# Patient Record
Sex: Female | Born: 1940
Health system: Southern US, Community
[De-identification: ages and names within clinical notes are randomized; demographics above are authoritative.]

## PROBLEM LIST (undated history)

## (undated) DIAGNOSIS — M545 Low back pain, unspecified: Secondary | ICD-10-CM

## (undated) DIAGNOSIS — G629 Polyneuropathy, unspecified: Secondary | ICD-10-CM

## (undated) DIAGNOSIS — I10 Essential (primary) hypertension: Secondary | ICD-10-CM

## (undated) DIAGNOSIS — E119 Type 2 diabetes mellitus without complications: Secondary | ICD-10-CM

## (undated) DIAGNOSIS — K219 Gastro-esophageal reflux disease without esophagitis: Secondary | ICD-10-CM

## (undated) DIAGNOSIS — E782 Mixed hyperlipidemia: Secondary | ICD-10-CM

## (undated) DIAGNOSIS — J309 Allergic rhinitis, unspecified: Secondary | ICD-10-CM

## (undated) DIAGNOSIS — J189 Pneumonia, unspecified organism: Secondary | ICD-10-CM

## (undated) DIAGNOSIS — I272 Pulmonary hypertension, unspecified: Secondary | ICD-10-CM

## (undated) DIAGNOSIS — G4733 Obstructive sleep apnea (adult) (pediatric): Secondary | ICD-10-CM

## (undated) HISTORY — DX: Mixed hyperlipidemia: E78.2

## (undated) HISTORY — PX: COLONOSCOPY: SHX174

## (undated) HISTORY — DX: Pulmonary hypertension, unspecified: I27.20

## (undated) HISTORY — DX: Low back pain, unspecified: M54.50

## (undated) HISTORY — DX: Type 2 diabetes mellitus without complications: E11.9

## (undated) HISTORY — PX: CYSTECTOMY: SUR359

## (undated) HISTORY — DX: Essential (primary) hypertension: I10

## (undated) HISTORY — DX: Allergic rhinitis, unspecified: J30.9

## (undated) HISTORY — PX: EYE SURGERY: SHX253

## (undated) HISTORY — PX: REPLACEMENT TOTAL KNEE BILATERAL: SUR1225

## (undated) HISTORY — DX: Low back pain: M54.5

## (undated) HISTORY — DX: Gastro-esophageal reflux disease without esophagitis: K21.9

## (undated) HISTORY — DX: Obstructive sleep apnea (adult) (pediatric): G47.33

## (undated) HISTORY — PX: ABDOMINAL HYSTERECTOMY: SHX81

## (undated) HISTORY — PX: CHOLECYSTECTOMY: SHX55

## (undated) HISTORY — DX: Polyneuropathy, unspecified: G62.9

---

## 1998-07-03 ENCOUNTER — Other Ambulatory Visit: Admission: RE | Admit: 1998-07-03 | Discharge: 1998-07-03 | Payer: Self-pay | Admitting: *Deleted

## 1998-07-27 ENCOUNTER — Encounter: Admission: RE | Admit: 1998-07-27 | Discharge: 1998-08-15 | Payer: Self-pay | Admitting: Specialist

## 1999-01-10 ENCOUNTER — Encounter: Payer: Self-pay | Admitting: Specialist

## 1999-01-18 ENCOUNTER — Inpatient Hospital Stay (HOSPITAL_COMMUNITY): Admission: RE | Admit: 1999-01-18 | Discharge: 1999-01-24 | Payer: Self-pay | Admitting: Specialist

## 1999-03-28 ENCOUNTER — Encounter: Admission: RE | Admit: 1999-03-28 | Discharge: 1999-05-03 | Payer: Self-pay | Admitting: Specialist

## 2003-11-03 ENCOUNTER — Encounter: Admission: RE | Admit: 2003-11-03 | Discharge: 2004-01-17 | Payer: Self-pay | Admitting: Internal Medicine

## 2004-10-18 ENCOUNTER — Observation Stay (HOSPITAL_COMMUNITY): Admission: EM | Admit: 2004-10-18 | Discharge: 2004-10-19 | Payer: Self-pay | Admitting: Emergency Medicine

## 2009-12-13 ENCOUNTER — Encounter: Payer: Self-pay | Admitting: Cardiovascular Disease

## 2013-03-08 DIAGNOSIS — H269 Unspecified cataract: Secondary | ICD-10-CM | POA: Diagnosis not present

## 2013-03-08 DIAGNOSIS — H2510 Age-related nuclear cataract, unspecified eye: Secondary | ICD-10-CM | POA: Diagnosis not present

## 2013-03-09 DIAGNOSIS — H2510 Age-related nuclear cataract, unspecified eye: Secondary | ICD-10-CM | POA: Diagnosis not present

## 2013-03-22 DIAGNOSIS — H269 Unspecified cataract: Secondary | ICD-10-CM | POA: Diagnosis not present

## 2013-03-22 DIAGNOSIS — H2510 Age-related nuclear cataract, unspecified eye: Secondary | ICD-10-CM | POA: Diagnosis not present

## 2013-03-23 DIAGNOSIS — R011 Cardiac murmur, unspecified: Secondary | ICD-10-CM | POA: Diagnosis not present

## 2013-03-23 DIAGNOSIS — E1149 Type 2 diabetes mellitus with other diabetic neurological complication: Secondary | ICD-10-CM | POA: Diagnosis not present

## 2013-03-23 DIAGNOSIS — R809 Proteinuria, unspecified: Secondary | ICD-10-CM | POA: Diagnosis not present

## 2013-04-01 DIAGNOSIS — D510 Vitamin B12 deficiency anemia due to intrinsic factor deficiency: Secondary | ICD-10-CM | POA: Diagnosis not present

## 2013-04-01 DIAGNOSIS — D539 Nutritional anemia, unspecified: Secondary | ICD-10-CM | POA: Diagnosis not present

## 2013-04-14 ENCOUNTER — Other Ambulatory Visit (HOSPITAL_COMMUNITY): Payer: Self-pay

## 2013-04-15 ENCOUNTER — Other Ambulatory Visit (HOSPITAL_COMMUNITY): Payer: Self-pay

## 2013-05-03 ENCOUNTER — Other Ambulatory Visit (HOSPITAL_COMMUNITY): Payer: Self-pay | Admitting: Family Medicine

## 2013-05-03 ENCOUNTER — Ambulatory Visit (HOSPITAL_COMMUNITY): Payer: Medicare Other | Attending: Family Medicine | Admitting: Cardiology

## 2013-05-03 DIAGNOSIS — R011 Cardiac murmur, unspecified: Secondary | ICD-10-CM | POA: Diagnosis not present

## 2013-05-03 DIAGNOSIS — I079 Rheumatic tricuspid valve disease, unspecified: Secondary | ICD-10-CM | POA: Insufficient documentation

## 2013-05-03 DIAGNOSIS — E785 Hyperlipidemia, unspecified: Secondary | ICD-10-CM | POA: Diagnosis not present

## 2013-05-03 DIAGNOSIS — E119 Type 2 diabetes mellitus without complications: Secondary | ICD-10-CM | POA: Insufficient documentation

## 2013-05-03 DIAGNOSIS — I080 Rheumatic disorders of both mitral and aortic valves: Secondary | ICD-10-CM | POA: Diagnosis not present

## 2013-05-03 DIAGNOSIS — I10 Essential (primary) hypertension: Secondary | ICD-10-CM | POA: Insufficient documentation

## 2013-05-03 DIAGNOSIS — I379 Nonrheumatic pulmonary valve disorder, unspecified: Secondary | ICD-10-CM | POA: Insufficient documentation

## 2013-05-03 NOTE — Progress Notes (Signed)
Echo performed. 

## 2013-05-24 DIAGNOSIS — D510 Vitamin B12 deficiency anemia due to intrinsic factor deficiency: Secondary | ICD-10-CM | POA: Diagnosis not present

## 2013-06-03 ENCOUNTER — Encounter: Payer: Self-pay | Admitting: *Deleted

## 2013-06-04 ENCOUNTER — Encounter: Payer: Self-pay | Admitting: Cardiology

## 2013-06-07 ENCOUNTER — Ambulatory Visit (INDEPENDENT_AMBULATORY_CARE_PROVIDER_SITE_OTHER): Payer: Medicare Other | Admitting: Cardiovascular Disease

## 2013-06-07 ENCOUNTER — Encounter: Payer: Self-pay | Admitting: Cardiovascular Disease

## 2013-06-07 ENCOUNTER — Encounter: Payer: Self-pay | Admitting: *Deleted

## 2013-06-07 VITALS — BP 130/84 | HR 76 | Ht 64.0 in | Wt 260.0 lb

## 2013-06-07 DIAGNOSIS — I519 Heart disease, unspecified: Secondary | ICD-10-CM | POA: Insufficient documentation

## 2013-06-07 DIAGNOSIS — R0602 Shortness of breath: Secondary | ICD-10-CM | POA: Diagnosis not present

## 2013-06-07 DIAGNOSIS — R079 Chest pain, unspecified: Secondary | ICD-10-CM

## 2013-06-07 DIAGNOSIS — Q2111 Secundum atrial septal defect: Secondary | ICD-10-CM

## 2013-06-07 DIAGNOSIS — Z136 Encounter for screening for cardiovascular disorders: Secondary | ICD-10-CM

## 2013-06-07 DIAGNOSIS — I38 Endocarditis, valve unspecified: Secondary | ICD-10-CM

## 2013-06-07 DIAGNOSIS — Q211 Atrial septal defect, unspecified: Secondary | ICD-10-CM

## 2013-06-07 NOTE — Patient Instructions (Signed)
Your physician has requested that you have a lexiscan myoview. For further information please visit [email or web address]. Please follow instruction sheet, as given. Office will contact with results via phone or letter.   Continue all current medications. Encouraged patient to wear c-pap Follow up in  3 months

## 2013-06-07 NOTE — Progress Notes (Signed)
Patient ID: Melissa Zhang, female   DOB: December 16, 1940, 73 y.o.   MRN: [id number]       CARDIOLOGY CONSULT NOTE  Patient ID: Melissa Zhang MRN: [id number] DOB/AGE: 1940-09-19 73 y.o.  Admit date: (Not on file) Primary Physician FRIED, Jaymes Graff, MD  Reason for Consultation: sob  HPI: The patient is a 73 year old woman with a history of insulin-dependent diabetes mellitus, hypertension, hyperlipidemia, and hypothyroidism who is referred today for shortness of breath. An ECG done today in the office shows sinus rhythm with a left anterior fascicular block. She went to her PA who heard a murmur, for which an echocardiogram was ordered. It showed normal left ventricular systolic function, EF 54-27%, mild LVH, grade 2 diastolic dysfunction, elevated filling pressures, mild aortic regurgitation, calcified mitral leaflet tips with moderate central regurgitation, moderate tricuspid regurgitation, PASP 61 mm, and a small secundum ASD. Upon further discussion, it appears that she was diagnosed with sleep apnea several years ago but has refused to use CPAP. She has been short of breath with exertion over the past 3-4 months. It has not progressed in frequency or severity. She has occasionally had intermittent chest pains over the left precordium as well. She says she has chronic leg swelling. She believes her blood pressure is normally well controlled. She denies lightheadedness, dizziness and syncope.   Allergies  Allergen Reactions  . Actos [Pioglitazone]   . Ciprofloxacin   . Lisinopril     Current Outpatient Prescriptions  Medication Sig Dispense Refill  . aspirin 81 MG tablet Take 81 mg by mouth daily.      . bisoprolol-hydrochlorothiazide (ZIAC) 5-6.25 MG per tablet Take 1 tablet by mouth daily.      . DULoxetine (CYMBALTA) 60 MG capsule Take 60 mg by mouth daily.      Marland Kitchen gabapentin (NEURONTIN) 300 MG capsule Take 300 mg by mouth at bedtime.      Marland Kitchen glimepiride (AMARYL) 4 MG  tablet Take 4 mg by mouth 2 (two) times daily.      Marland Kitchen LANTUS SOLOSTAR 100 UNIT/ML Solostar Pen Inject 25 Units into the skin daily.      Marland Kitchen levothyroxine (SYNTHROID, LEVOTHROID) 50 MCG tablet Take 50 mcg by mouth daily.      . Liraglutide 18 MG/3ML SOPN Inject 1.8 mg into the skin daily.      Marland Kitchen losartan (COZAAR) 100 MG tablet Take 100 mg by mouth daily.       Marland Kitchen lovastatin (MEVACOR) 40 MG tablet Take 40 mg by mouth at bedtime.      . metFORMIN (GLUCOPHAGE) 1000 MG tablet Take 1,000 mg by mouth 2 (two) times daily.       No current facility-administered medications for this visit.    Past Medical History  Diagnosis Date  . Type 2 diabetes mellitus   . Hypertension   . Low back pain   . Peripheral neuropathy   . Allergic rhinitis   . Esophageal reflux   . Obstructive sleep apnea   . Mixed hyperlipidemia     Past Surgical History  Procedure Laterality Date  . Cholecystectomy    . Abdominal hysterectomy    . Replacement total knee bilateral    . Cystectomy      LEFT BREAST    History   Social History  . Marital Status: Married    Spouse Name: N/A    Number of Children: N/A  . Years of Education: N/A   Occupational History  . Not on file.  Social History Main Topics  . Smoking status: Never Smoker   . Smokeless tobacco: Not on file  . Alcohol Use: No  . Drug Use: No  . Sexual Activity: Not on file   Other Topics Concern  . Not on file   Social History Narrative  . No narrative on file     No family history of premature CAD in 1st degree relatives.  Prior to Admission medications   Medication Sig Start Date End Date Taking? Authorizing Provider  aspirin 81 MG tablet Take 81 mg by mouth daily.   Yes Historical Provider, MD  bisoprolol-hydrochlorothiazide (ZIAC) 5-6.25 MG per tablet Take 1 tablet by mouth daily.   Yes Historical Provider, MD  DULoxetine (CYMBALTA) 60 MG capsule Take 60 mg by mouth daily. 03/05/13  Yes Historical Provider, MD  gabapentin  (NEURONTIN) 300 MG capsule Take 300 mg by mouth at bedtime. 04/11/13  Yes Historical Provider, MD  glimepiride (AMARYL) 4 MG tablet Take 4 mg by mouth 2 (two) times daily. 02/26/13  Yes Historical Provider, MD  LANTUS SOLOSTAR 100 UNIT/ML Solostar Pen Inject 25 Units into the skin daily. 03/01/13  Yes Historical Provider, MD  levothyroxine (SYNTHROID, LEVOTHROID) 50 MCG tablet Take 50 mcg by mouth daily. 02/26/13  Yes Historical Provider, MD  Liraglutide 18 MG/3ML SOPN Inject 1.8 mg into the skin daily.   Yes Historical Provider, MD  losartan (COZAAR) 100 MG tablet Take 100 mg by mouth daily.  03/26/13  Yes Historical Provider, MD  lovastatin (MEVACOR) 40 MG tablet Take 40 mg by mouth at bedtime. 03/26/13  Yes Historical Provider, MD  metFORMIN (GLUCOPHAGE) 1000 MG tablet Take 1,000 mg by mouth 2 (two) times daily. 02/26/13  Yes Historical Provider, MD     Review of systems complete and found to be negative unless listed above in HPI     Physical exam Blood pressure 130/84, pulse 76, height 5\' 4"  (1.626 m), weight 260 lb (117.935 kg), SpO2 98.00%. General: NAD Neck: No JVD, no thyromegaly or thyroid nodule.  Lungs: Clear to auscultation bilaterally with normal respiratory effort. CV: Nondisplaced PMI.  Heart regular S1/S2, no S3/S4, II/VI apical holosystolic murmur and II/VI pansystolic murmur at base.  Trace non-pitting peripheral edema.  No carotid bruit.  Normal pedal pulses.  Abdomen: Soft, nontender, no hepatosplenomegaly, no distention.  Skin: Intact without lesions or rashes.  Neurologic: Alert and oriented x 3.  Psych: Normal affect. Extremities: No clubbing or cyanosis.  HEENT: Normal.   Labs:   No results found for this basename: WBC, HGB, HCT, MCV, PLT   No results found for this basename: NA, K, CL, CO2, BUN, CREATININE, CALCIUM, LABALBU, PROT, BILITOT, ALKPHOS, ALT, AST, GLUCOSE,  in the last 168 hours No results found for this basename: CKTOTAL, CKMB, CKMBINDEX, TROPONINI    No  results found for this basename: CHOL   No results found for this basename: HDL   No results found for this basename: LDLCALC   No results found for this basename: TRIG   No results found for this basename: CHOLHDL   No results found for this basename: LDLDIRECT         Studies:  - Left ventricle: The cavity size was normal. There was mild concentric hypertrophy. Systolic function was normal. The estimated ejection fraction was in the range of 60% to 65%. Wall motion was normal; there were no regional wall motion abnormalities. Doppler parameters are consistent with pseduonormal left ventricular relaxation (grade2 diastolic dysfunction). The E/A ratio is  1.5. The E/e' ratio is >15, suggesting markedly elevated LV filling pressure. - Aortic valve: Mildly calcified leaflets. Mild regurgitation. - Mitral valve: Calcified leaflet tips, with a larger calcified mass on the posterior mitral leaflet tip. Moderate central regurgitation. - Left atrium: The atrium was moderately dilated (36 ml/m2). - Atrial septum: Suggestion of a small secundum ASD by color doppler with left to right flow. - Tricuspid valve: Moderate regurgitation. - Pulmonic valve: The valve appears to be grossly normal. Mild regurgitation. - Pulmonary arteries: PA peak pressure: 78mm Hg (S). - Inferior vena cava: The vessel was normal in size; the respirophasic diameter changes were in the normal range (= 50%); findings are consistent with normal central venous pressure. - Pericardium, extracardiac: There was no pericardial effusion.   ASSESSMENT AND PLAN: 1. Dyspnea on exertion: It appears leaflet calcification of the mitral valve has led to moderate mitral regurgitation, which is contributing to her shortness of breath. She also has moderate tricuspid regurgitation with elevated pulmonary pressures, which is being exacerbated most certainly by untreated sleep apnea. I have strongly recommended that she  consider the use of CPAP. Because she has also had chest pains and given her history of diabetes, hypertension, and hyperlipidemia, I will also proceed with a Lexi scan Cardiolite stress test to evaluate for occult ischemic heart disease.  2. Chest pain: see #1. Do to multiple cardiovascular risk factors, will proceed with a nuclear stress test to evaluate for occult ischemic heart disease.  3. Grade II diastolic dysfunction: In order to avoid diastolic decompensation and heart failure, will aim to optimally control blood pressure. BP appears to be well controlled today.  4. Small secundum ASD: This appears to be small and is not likely to be causing significant hemodynamic problems. If her dyspnea on exertion were to increase in frequency or severity, I would consider right heart catheterization and shunt quantification.  5. Valvular heart disease: see #1.  Dispo: f/u 3 months.  Signed: Kate Sable, M.D., F.A.C.C.  06/07/2013, 1:38 PM

## 2013-06-15 ENCOUNTER — Encounter (HOSPITAL_COMMUNITY)
Admission: RE | Admit: 2013-06-15 | Discharge: 2013-06-15 | Disposition: A | Discharge status: Home / Self Care | Payer: Medicare Other | Source: Ambulatory Visit | Attending: Cardiovascular Disease | Admitting: Cardiovascular Disease

## 2013-06-15 ENCOUNTER — Encounter (HOSPITAL_COMMUNITY): Payer: Self-pay

## 2013-06-15 DIAGNOSIS — I10 Essential (primary) hypertension: Secondary | ICD-10-CM | POA: Insufficient documentation

## 2013-06-15 DIAGNOSIS — R0602 Shortness of breath: Secondary | ICD-10-CM | POA: Insufficient documentation

## 2013-06-15 DIAGNOSIS — Z79899 Other long term (current) drug therapy: Secondary | ICD-10-CM | POA: Insufficient documentation

## 2013-06-15 DIAGNOSIS — E785 Hyperlipidemia, unspecified: Secondary | ICD-10-CM | POA: Insufficient documentation

## 2013-06-15 DIAGNOSIS — R079 Chest pain, unspecified: Secondary | ICD-10-CM | POA: Insufficient documentation

## 2013-06-15 DIAGNOSIS — Z794 Long term (current) use of insulin: Secondary | ICD-10-CM | POA: Insufficient documentation

## 2013-06-15 DIAGNOSIS — E119 Type 2 diabetes mellitus without complications: Secondary | ICD-10-CM | POA: Insufficient documentation

## 2013-06-15 DIAGNOSIS — I38 Endocarditis, valve unspecified: Secondary | ICD-10-CM

## 2013-06-15 MED ORDER — SODIUM CHLORIDE 0.9 % IJ SOLN
INTRAMUSCULAR | Status: AC
  Administered 2013-06-15: 10 mL via INTRAVENOUS
  Filled 2013-06-15: qty 10

## 2013-06-15 MED ORDER — TECHNETIUM TC 99M SESTAMIBI - CARDIOLITE
30.0000 | Freq: Once | INTRAVENOUS | Status: AC | PRN
  Administered 2013-06-15: 11:00:00 30 via INTRAVENOUS

## 2013-06-15 MED ORDER — TECHNETIUM TC 99M SESTAMIBI GENERIC - CARDIOLITE
10.0000 | Freq: Once | INTRAVENOUS | Status: AC | PRN
  Administered 2013-06-15: 10 via INTRAVENOUS

## 2013-06-15 MED ORDER — REGADENOSON 0.4 MG/5ML IV SOLN
INTRAVENOUS | Status: AC
Start: 2013-06-15 — End: 2013-06-15
  Administered 2013-06-15: 0.4 mg via INTRAVENOUS
  Filled 2013-06-15: qty 5

## 2013-06-15 NOTE — Progress Notes (Signed)
Stress Lab Nurses Notes - [facility]  HASEL JANISH 06/15/2013 Reason for doing test: Chest Pain and Dyspnea Type of test: Wille Glaser Nurse performing test: Gerrit Halls, RN Nuclear Medicine Tech: Melburn Hake Echo Tech: Not Applicable MD performing test: Koneswaran/K.Purcell Nails NP Family MD: Dr. Maceo Pro Test explained and consent signed: yes IV started: 22g jelco, Saline lock flushed, No redness or edema and Saline lock started in radiology Symptoms: stomach discomfort Treatment/Intervention: None Reason test stopped: protocol completed After recovery IV was: Discontinued via X-ray tech and No redness or edema Patient to return to [place]. Med at : 11:25 Patient discharged: Home Patient's Condition upon discharge was: stable Comments: During test BP 147/70 & HR 94.  Recovery BP 129/67 & HR 81.  Symptoms resolved in recovery. Donnajean Lopes

## 2013-06-25 ENCOUNTER — Telehealth: Payer: Self-pay | Admitting: *Deleted

## 2013-06-25 NOTE — Telephone Encounter (Signed)
Message copied by Laurine Blazer on Fri Jun 25, 2013 10:05 AM ------      Message from: Kate Sable A      Created: Fri Jun 18, 2013  9:21 AM       No, that's fine. ------

## 2013-06-25 NOTE — Telephone Encounter (Signed)
Notes Recorded by Laurine Blazer, LPN on 08/23/1948 at 93:26 AM Follow up scheduled for 09/13/2013 with Dr. Bronson Ing. ------ Notes Recorded by Laurine Blazer, LPN on 08/18/2456 at 09:98 AM Patient notified and verbalized understanding.  ----- Notes Recorded by Herminio Commons, MD on 06/18/2013 at 9:21 AM No, that's fine. ------ Notes Recorded by Laurine Blazer, LPN on 3/38/2505 at 3:97 PM She is not due to come back for 3 months.  Do you want to see her sooner? ------ Notes Recorded by Herminio Commons, MD on 06/15/2013 at 4:18 PM I will discuss results with pt at ov.

## 2013-09-13 ENCOUNTER — Ambulatory Visit (INDEPENDENT_AMBULATORY_CARE_PROVIDER_SITE_OTHER): Payer: Medicare Other | Admitting: Cardiovascular Disease

## 2013-09-13 ENCOUNTER — Encounter: Payer: Self-pay | Admitting: Cardiovascular Disease

## 2013-09-13 VITALS — BP 139/83 | HR 69 | Ht 62.0 in | Wt 264.0 lb

## 2013-09-13 DIAGNOSIS — I519 Heart disease, unspecified: Secondary | ICD-10-CM | POA: Diagnosis not present

## 2013-09-13 DIAGNOSIS — R079 Chest pain, unspecified: Secondary | ICD-10-CM | POA: Diagnosis not present

## 2013-09-13 DIAGNOSIS — R0602 Shortness of breath: Secondary | ICD-10-CM

## 2013-09-13 DIAGNOSIS — Q2111 Secundum atrial septal defect: Secondary | ICD-10-CM

## 2013-09-13 DIAGNOSIS — I38 Endocarditis, valve unspecified: Secondary | ICD-10-CM | POA: Diagnosis not present

## 2013-09-13 DIAGNOSIS — R609 Edema, unspecified: Secondary | ICD-10-CM

## 2013-09-13 DIAGNOSIS — Q211 Atrial septal defect, unspecified: Secondary | ICD-10-CM

## 2013-09-13 DIAGNOSIS — R600 Localized edema: Secondary | ICD-10-CM

## 2013-09-13 MED ORDER — HYDROCHLOROTHIAZIDE 12.5 MG PO CAPS: 12.5000 mg | ORAL_CAPSULE | Freq: Every day | ORAL | Status: DC

## 2013-09-13 NOTE — Progress Notes (Signed)
Patient ID: Melissa Zhang, female   DOB: Jan 12, 1941, 73 y.o.   MRN: [id number]      SUBJECTIVE: The patient is here to followup on the results of cardiovascular testing performed for the evaluation of chest pain and dyspnea on exertion. Nuclear stress testing demonstrated a small, fixed anteroapical defect which i=was likely due to soft tissue  attenuation artifact rather than myocardial scar. She very seldom has chest pains. She has chronic leg swelling. She has not used CPAP since her last visit. Her chronic dyspnea on exertion over the past several months has not gotten any worse.    Allergies  Allergen Reactions  . Actos [Pioglitazone]   . Ciprofloxacin   . Lisinopril     Current Outpatient Prescriptions  Medication Sig Dispense Refill  . aspirin 81 MG tablet Take 81 mg by mouth daily.      . bisoprolol-hydrochlorothiazide (ZIAC) 5-6.25 MG per tablet Take 1 tablet by mouth daily.      . DULoxetine (CYMBALTA) 60 MG capsule Take 60 mg by mouth daily.      Marland Kitchen gabapentin (NEURONTIN) 300 MG capsule Take 300 mg by mouth at bedtime.      Marland Kitchen glimepiride (AMARYL) 4 MG tablet Take 4 mg by mouth 2 (two) times daily.      Marland Kitchen LANTUS SOLOSTAR 100 UNIT/ML Solostar Pen Inject 25 Units into the skin daily.      Marland Kitchen levothyroxine (SYNTHROID, LEVOTHROID) 50 MCG tablet Take 50 mcg by mouth daily.      . Liraglutide 18 MG/3ML SOPN Inject 1.8 mg into the skin daily.      Marland Kitchen losartan (COZAAR) 100 MG tablet Take 100 mg by mouth daily.       Marland Kitchen lovastatin (MEVACOR) 40 MG tablet Take 40 mg by mouth at bedtime.      . metFORMIN (GLUCOPHAGE) 1000 MG tablet Take 1,000 mg by mouth 2 (two) times daily.       No current facility-administered medications for this visit.    Past Medical History  Diagnosis Date  . Type 2 diabetes mellitus   . Hypertension   . Low back pain   . Peripheral neuropathy   . Allergic rhinitis   . Esophageal reflux   . Obstructive sleep apnea   . Mixed hyperlipidemia     Past  Surgical History  Procedure Laterality Date  . Cholecystectomy    . Abdominal hysterectomy    . Replacement total knee bilateral    . Cystectomy      LEFT BREAST    History   Social History  . Marital Status: Married    Spouse Name: N/A    Number of Children: N/A  . Years of Education: N/A   Occupational History  . Not on file.   Social History Main Topics  . Smoking status: Never Smoker   . Smokeless tobacco: Not on file  . Alcohol Use: No  . Drug Use: No  . Sexual Activity: Not on file   Other Topics Concern  . Not on file   Social History Narrative  . No narrative on file   BP 139/83 Pulse 69    PHYSICAL EXAM General: NAD  Neck: No JVD, no thyromegaly or thyroid nodule.  Lungs: Faint bibasilar crackles. CV: Nondisplaced PMI. Heart regular S1/S2, no S3/S4, II/VI apical holosystolic murmur and II/VI pansystolic murmur at base. Trace non-pitting peripheral edema. No carotid bruit. Normal pedal pulses.  Abdomen: Soft, nontender, no hepatosplenomegaly, no distention.  Skin: Intact without  lesions or rashes.  Neurologic: Alert and oriented x 3.  Psych: Normal affect.  Extremities: No clubbing or cyanosis.  HEENT: Normal.    ECG: reviewed and available in electronic records.    ASSESSMENT AND PLAN: 1. Dyspnea on exertion: It appears leaflet calcification of the mitral valve has led to moderate mitral regurgitation, which is contributing to her shortness of breath. She also has moderate tricuspid regurgitation with elevated pulmonary pressures, which is being exacerbated most certainly by untreated sleep apnea. I have again strongly recommended that she consider the use of CPAP. Lexiscan Cardiolite stress test did not reveal any evidence of occult ischemic heart disease.  Will continue to monitor for symptom progression. Will add 12.5 mg HCTZ daily given faint bibasilar crackles. 2. Chest pain: Nuclear stress test did not demonstrate any suggestion of occult  ischemic heart disease. Continue to monitor symptoms. 3. Grade II diastolic dysfunction: In order to avoid diastolic decompensation and heart failure, will aim to optimally control blood pressure. BP appears to be reasonably controlled today.  4. Small secundum ASD: This appears to be small and is not likely to be causing significant hemodynamic problems. If her dyspnea on exertion were to increase in frequency or severity, I would consider right heart catheterization and shunt quantification.  5. Valvular heart disease: see #1.  6. Sleep apnea: CPAP use encouraged. 7. Leg edema: Add 12.5 mg HCTZ daily in addition to [place]. 8. Essential HTN: Add 12.5 mg HCTZ daily in addition to [place].  Dispo: f/u 6 months.    Kate Sable, M.D., F.A.C.C.

## 2013-09-13 NOTE — Patient Instructions (Signed)
   Add HCTZ 12.5mg  daily - new sent to pharm  Continue all other medications.   Your physician wants you to follow up in: 6 months.  You will receive a reminder letter in the mail one-two months in advance.  If you don't receive a letter, please call our office to schedule the follow up appointment

## 2013-10-12 DIAGNOSIS — I10 Essential (primary) hypertension: Secondary | ICD-10-CM | POA: Diagnosis not present

## 2013-10-12 DIAGNOSIS — E039 Hypothyroidism, unspecified: Secondary | ICD-10-CM | POA: Diagnosis not present

## 2013-10-12 DIAGNOSIS — Z1331 Encounter for screening for depression: Secondary | ICD-10-CM | POA: Diagnosis not present

## 2013-10-12 DIAGNOSIS — N183 Chronic kidney disease, stage 3 unspecified: Secondary | ICD-10-CM | POA: Diagnosis not present

## 2013-10-12 DIAGNOSIS — E782 Mixed hyperlipidemia: Secondary | ICD-10-CM | POA: Diagnosis not present

## 2013-10-12 DIAGNOSIS — E1149 Type 2 diabetes mellitus with other diabetic neurological complication: Secondary | ICD-10-CM | POA: Diagnosis not present

## 2013-10-12 DIAGNOSIS — E1142 Type 2 diabetes mellitus with diabetic polyneuropathy: Secondary | ICD-10-CM | POA: Diagnosis not present

## 2013-10-12 DIAGNOSIS — R809 Proteinuria, unspecified: Secondary | ICD-10-CM | POA: Diagnosis not present

## 2013-10-15 DIAGNOSIS — E875 Hyperkalemia: Secondary | ICD-10-CM | POA: Diagnosis not present

## 2013-11-08 DIAGNOSIS — E875 Hyperkalemia: Secondary | ICD-10-CM | POA: Diagnosis not present

## 2013-12-03 ENCOUNTER — Other Ambulatory Visit: Payer: Self-pay

## 2013-12-10 ENCOUNTER — Other Ambulatory Visit: Payer: Self-pay | Admitting: *Deleted

## 2013-12-10 MED ORDER — HYDROCHLOROTHIAZIDE 12.5 MG PO CAPS: 12.5000 mg | ORAL_CAPSULE | Freq: Every day | ORAL | Status: DC

## 2013-12-30 DIAGNOSIS — E875 Hyperkalemia: Secondary | ICD-10-CM | POA: Diagnosis not present

## 2013-12-30 DIAGNOSIS — M2540 Effusion, unspecified joint: Secondary | ICD-10-CM | POA: Diagnosis not present

## 2014-01-21 DIAGNOSIS — R202 Paresthesia of skin: Secondary | ICD-10-CM | POA: Diagnosis not present

## 2014-01-21 DIAGNOSIS — M6530 Trigger finger, unspecified finger: Secondary | ICD-10-CM | POA: Diagnosis not present

## 2014-02-17 DIAGNOSIS — M79645 Pain in left finger(s): Secondary | ICD-10-CM | POA: Diagnosis not present

## 2014-02-17 DIAGNOSIS — G5602 Carpal tunnel syndrome, left upper limb: Secondary | ICD-10-CM | POA: Diagnosis not present

## 2014-02-17 DIAGNOSIS — M65342 Trigger finger, left ring finger: Secondary | ICD-10-CM | POA: Diagnosis not present

## 2014-03-17 DIAGNOSIS — M65342 Trigger finger, left ring finger: Secondary | ICD-10-CM | POA: Diagnosis not present

## 2014-03-17 DIAGNOSIS — G5602 Carpal tunnel syndrome, left upper limb: Secondary | ICD-10-CM | POA: Diagnosis not present

## 2014-03-28 ENCOUNTER — Ambulatory Visit (INDEPENDENT_AMBULATORY_CARE_PROVIDER_SITE_OTHER): Payer: Medicare Other | Admitting: Cardiovascular Disease

## 2014-03-28 ENCOUNTER — Encounter: Payer: Self-pay | Admitting: Cardiovascular Disease

## 2014-03-28 VITALS — BP 122/68 | HR 73 | Ht 62.0 in | Wt 259.0 lb

## 2014-03-28 DIAGNOSIS — I38 Endocarditis, valve unspecified: Secondary | ICD-10-CM | POA: Diagnosis not present

## 2014-03-28 DIAGNOSIS — I071 Rheumatic tricuspid insufficiency: Secondary | ICD-10-CM

## 2014-03-28 DIAGNOSIS — Q211 Atrial septal defect, unspecified: Secondary | ICD-10-CM

## 2014-03-28 DIAGNOSIS — G4733 Obstructive sleep apnea (adult) (pediatric): Secondary | ICD-10-CM

## 2014-03-28 DIAGNOSIS — R600 Localized edema: Secondary | ICD-10-CM | POA: Diagnosis not present

## 2014-03-28 DIAGNOSIS — R079 Chest pain, unspecified: Secondary | ICD-10-CM

## 2014-03-28 DIAGNOSIS — I519 Heart disease, unspecified: Secondary | ICD-10-CM | POA: Diagnosis not present

## 2014-03-28 DIAGNOSIS — R0602 Shortness of breath: Secondary | ICD-10-CM

## 2014-03-28 DIAGNOSIS — I340 Nonrheumatic mitral (valve) insufficiency: Secondary | ICD-10-CM

## 2014-03-28 NOTE — Patient Instructions (Signed)
Continue all current medications. Your physician wants you to follow up in: 6 months.  You will receive a reminder letter in the mail one-two months in advance.  If you don't receive a letter, please call our office to schedule the follow up appointment   

## 2014-03-28 NOTE — Progress Notes (Signed)
Patient ID: YUDITH NORLANDER, female   DOB: 1941/01/12, 74 y.o.   MRN: [id number]      SUBJECTIVE: The patient returns for routine follow-up. Mrs. Fraiser has been doing well. She seldom experiences chest pain. She denies any worsening of chronic dyspnea on exertion. Her leg swelling has been well controlled with low-dose hydrochlorothiazide. She does not use CPAP for sleep apnea.   Review of Systems: As per "subjective", otherwise negative.  Allergies  Allergen Reactions  . Actos [Pioglitazone]   . Ciprofloxacin   . Lisinopril     Current Outpatient Prescriptions  Medication Sig Dispense Refill  . aspirin 81 MG tablet Take 81 mg by mouth daily.    . bisoprolol-hydrochlorothiazide (ZIAC) 5-6.25 MG per tablet Take 1 tablet by mouth daily.    . DULoxetine (CYMBALTA) 60 MG capsule Take 60 mg by mouth daily.    Marland Kitchen gabapentin (NEURONTIN) 300 MG capsule Take 300 mg by mouth at bedtime.    Marland Kitchen glimepiride (AMARYL) 4 MG tablet Take 4 mg by mouth 2 (two) times daily.    . hydrochlorothiazide (MICROZIDE) 12.5 MG capsule Take 1 capsule (12.5 mg total) by mouth daily. 90 capsule 3  . LANTUS SOLOSTAR 100 UNIT/ML Solostar Pen Inject 25 Units into the skin daily.    Marland Kitchen levothyroxine (SYNTHROID, LEVOTHROID) 50 MCG tablet Take 50 mcg by mouth daily.    Marland Kitchen losartan (COZAAR) 100 MG tablet Take 100 mg by mouth daily.     Marland Kitchen lovastatin (MEVACOR) 40 MG tablet Take 40 mg by mouth at bedtime.    . metFORMIN (GLUCOPHAGE) 1000 MG tablet Take 1,000 mg by mouth 2 (two) times daily.     No current facility-administered medications for this visit.    Past Medical History  Diagnosis Date  . Type 2 diabetes mellitus   . Hypertension   . Low back pain   . Peripheral neuropathy   . Allergic rhinitis   . Esophageal reflux   . Obstructive sleep apnea   . Mixed hyperlipidemia     Past Surgical History  Procedure Laterality Date  . Cholecystectomy    . Abdominal hysterectomy    . Replacement total knee  bilateral    . Cystectomy      LEFT BREAST    History   Social History  . Marital Status: Married    Spouse Name: N/A    Number of Children: N/A  . Years of Education: N/A   Occupational History  . Not on file.   Social History Main Topics  . Smoking status: Never Smoker   . Smokeless tobacco: Never Used  . Alcohol Use: No  . Drug Use: No  . Sexual Activity: Not on file   Other Topics Concern  . Not on file   Social History Narrative     Filed Vitals:   03/28/14 1253  BP: 122/68  Pulse: 73  Height: 5\' 2"  (1.575 m)  Weight: 259 lb (117.482 kg)  SpO2: 97%    PHYSICAL EXAM General: NAD  Neck: No JVD, no thyromegaly or thyroid nodule.  Lungs: Faint bibasilar crackles. CV: Nondisplaced PMI. Heart regular S1/S2, no S3/S4, II/VI apical holosystolic murmur and II/VI pansystolic murmur at base. Trace non-pitting peripheral edema. No carotid bruit.  Abdomen: Soft, nontender, obese, no distention.  Skin: Intact without lesions or rashes.  Neurologic: Alert and oriented x 3.  Psych: Normal affect.  Extremities: No clubbing or cyanosis.  HEENT: Normal. Skin: Normal. Musculoskeletal: Normal range of motion, no gross deformities.  ECG: Most recent ECG reviewed.      ASSESSMENT AND PLAN: 1. Dyspnea on exertion: Symptomatically stable. Stable moderate mitral regurgitation, which is contributing to her shortness of breath. She also has moderate tricuspid regurgitation with elevated pulmonary pressures, which is being exacerbated most certainly by untreated sleep apnea. I previously recommended that she consider the use of CPAP. Lexiscan Cardiolite stress test did not reveal any evidence of occult ischemic heart disease.  Will continue to monitor for symptom progression.  2. Chest pain: Nuclear stress test did not demonstrate any suggestion of occult ischemic heart disease. Continue to monitor symptoms. 3. Grade II diastolic dysfunction: In order to avoid  diastolic decompensation and heart failure, will aim to optimally control blood pressure. BP appears to be well controlled today.  4. Small secundum ASD: This appears to be small and is not likely to be causing significant hemodynamic problems. If her dyspnea on exertion were to increase in frequency or severity, I would consider right heart catheterization and shunt quantification.  5. Valvular heart disease: see #1.  6. Sleep apnea: CPAP use encouraged. 7. Leg edema: Continue 12.5 mg HCTZ daily in addition to [place]. 8. Essential HTN: Well controlled. Continue 12.5 mg HCTZ daily in addition to [place].  Dispo: f/u 6 months.    Kate Sable, M.D., F.A.C.C.

## 2014-03-28 NOTE — Addendum Note (Signed)
Addended by: Laurine Blazer on: 03/28/2014 01:23 PM   Modules accepted: Orders, Medications, Level of Service

## 2014-08-15 ENCOUNTER — Other Ambulatory Visit: Payer: Self-pay

## 2014-08-18 DIAGNOSIS — I10 Essential (primary) hypertension: Secondary | ICD-10-CM | POA: Diagnosis not present

## 2014-08-18 DIAGNOSIS — N183 Chronic kidney disease, stage 3 (moderate): Secondary | ICD-10-CM | POA: Diagnosis not present

## 2014-08-18 DIAGNOSIS — Z794 Long term (current) use of insulin: Secondary | ICD-10-CM | POA: Diagnosis not present

## 2014-08-18 DIAGNOSIS — E1140 Type 2 diabetes mellitus with diabetic neuropathy, unspecified: Secondary | ICD-10-CM | POA: Diagnosis not present

## 2014-08-18 DIAGNOSIS — E039 Hypothyroidism, unspecified: Secondary | ICD-10-CM | POA: Diagnosis not present

## 2014-08-18 DIAGNOSIS — E1165 Type 2 diabetes mellitus with hyperglycemia: Secondary | ICD-10-CM | POA: Diagnosis not present

## 2014-09-15 DIAGNOSIS — E1140 Type 2 diabetes mellitus with diabetic neuropathy, unspecified: Secondary | ICD-10-CM | POA: Diagnosis not present

## 2014-10-21 ENCOUNTER — Encounter: Payer: Self-pay | Admitting: Cardiovascular Disease

## 2014-10-21 ENCOUNTER — Ambulatory Visit (INDEPENDENT_AMBULATORY_CARE_PROVIDER_SITE_OTHER): Payer: Medicare Other | Admitting: Cardiovascular Disease

## 2014-10-21 VITALS — BP 126/74 | HR 75 | Ht 62.0 in | Wt 253.0 lb

## 2014-10-21 DIAGNOSIS — R600 Localized edema: Secondary | ICD-10-CM

## 2014-10-21 DIAGNOSIS — R0609 Other forms of dyspnea: Secondary | ICD-10-CM | POA: Diagnosis not present

## 2014-10-21 DIAGNOSIS — I519 Heart disease, unspecified: Secondary | ICD-10-CM | POA: Diagnosis not present

## 2014-10-21 DIAGNOSIS — I38 Endocarditis, valve unspecified: Secondary | ICD-10-CM | POA: Diagnosis not present

## 2014-10-21 DIAGNOSIS — Q211 Atrial septal defect, unspecified: Secondary | ICD-10-CM

## 2014-10-21 DIAGNOSIS — R079 Chest pain, unspecified: Secondary | ICD-10-CM

## 2014-10-21 DIAGNOSIS — I340 Nonrheumatic mitral (valve) insufficiency: Secondary | ICD-10-CM

## 2014-10-21 DIAGNOSIS — G4733 Obstructive sleep apnea (adult) (pediatric): Secondary | ICD-10-CM

## 2014-10-21 DIAGNOSIS — Z136 Encounter for screening for cardiovascular disorders: Secondary | ICD-10-CM

## 2014-10-21 NOTE — Progress Notes (Signed)
Patient ID: Melissa Zhang, female   DOB: April 26, 1940, 74 y.o.   MRN: [id number]      SUBJECTIVE: The patient returns for routine cardiovascular follow-up for valvular heart disease and chronic exertional dyspnea in the setting of obesity and sleep apnea.  She very seldom has chest pains lasting only seconds. Her chronic exertional dyspnea is unchanged. Her leg swelling improved with cessation of amlodipine. She continues to refuse CPAP.  ECG performed in the office today demonstrates normal sinus rhythm with no ischemic ST segment or T-wave abnormalities, nor any arrhythmias.   Review of Systems: As per "subjective", otherwise negative.  Allergies  Allergen Reactions  . Actos [Pioglitazone]   . Ciprofloxacin   . Lisinopril     Current Outpatient Prescriptions  Medication Sig Dispense Refill  . aspirin 81 MG tablet Take 81 mg by mouth daily.    . bisoprolol-hydrochlorothiazide (ZIAC) 5-6.25 MG per tablet Take 1 tablet by mouth daily.    . DULoxetine (CYMBALTA) 60 MG capsule Take 60 mg by mouth daily.    Marland Kitchen gabapentin (NEURONTIN) 300 MG capsule Take 300 mg by mouth at bedtime.    Marland Kitchen glimepiride (AMARYL) 4 MG tablet Take 4 mg by mouth 2 (two) times daily.    . hydrochlorothiazide (MICROZIDE) 12.5 MG capsule Take 1 capsule (12.5 mg total) by mouth daily. 90 capsule 3  . LANTUS SOLOSTAR 100 UNIT/ML Solostar Pen Inject 25 Units into the skin daily.    Marland Kitchen levothyroxine (SYNTHROID, LEVOTHROID) 50 MCG tablet Take 50 mcg by mouth daily.    Marland Kitchen lovastatin (MEVACOR) 40 MG tablet Take 40 mg by mouth at bedtime.    . metFORMIN (GLUCOPHAGE) 1000 MG tablet Take 1,000 mg by mouth 2 (two) times daily.     No current facility-administered medications for this visit.    Past Medical History  Diagnosis Date  . Type 2 diabetes mellitus   . Hypertension   . Low back pain   . Peripheral neuropathy   . Allergic rhinitis   . Esophageal reflux   . Obstructive sleep apnea   . Mixed hyperlipidemia      Past Surgical History  Procedure Laterality Date  . Cholecystectomy    . Abdominal hysterectomy    . Replacement total knee bilateral    . Cystectomy      LEFT BREAST    Social History   Social History  . Marital Status: Married    Spouse Name: N/A  . Number of Children: N/A  . Years of Education: N/A   Occupational History  . Not on file.   Social History Main Topics  . Smoking status: Never Smoker   . Smokeless tobacco: Never Used  . Alcohol Use: No  . Drug Use: No  . Sexual Activity: Not on file   Other Topics Concern  . Not on file   Social History Narrative     Filed Vitals:   10/21/14 1415  BP: 126/74  Pulse: 75  Height: 5\' 2"  (1.575 m)  Weight: 253 lb (114.76 kg)  SpO2: 97%    PHYSICAL EXAM General: NAD  Neck: No JVD, no thyromegaly or thyroid nodule.  Lungs: Diminished in lower lung fields, no rales or wheezes. CV: Nondisplaced PMI. Heart regular S1/S2, no S3/S4, I/VI apical holosystolic murmur and I/VI pansystolic murmur at base. Trace non-pitting peripheral edema. No carotid bruit.  Abdomen: Soft, obese.  Skin: Intact without lesions or rashes.  Neurologic: Alert and oriented x 3.  Psych: Normal affect.  Extremities:  No clubbing or cyanosis.  HEENT: Normal. Skin: Normal. Musculoskeletal: Normal range of motion, no gross deformities.   ECG: Most recent ECG reviewed.      ASSESSMENT AND PLAN: 1. Dyspnea on exertion: Symptomatically stable. Stable moderate mitral regurgitation. Murmur less pronounced today. She also has moderate tricuspid regurgitation with elevated pulmonary pressures, which is being exacerbated most certainly by untreated sleep apnea, and likely contributing to her chronic exertional dyspnea. She continues to refuse CPAP.Marland Kitchen Lexiscan Cardiolite stress test did not reveal any evidence of occult ischemic heart disease.  Will continue to monitor for symptom progression.   2. Chest pain: Nuclear stress test on  06/15/13 did not demonstrate any suggestion of occult ischemic heart disease. Continue to monitor symptoms.  3. Grade II diastolic dysfunction: In order to avoid diastolic decompensation and heart failure, will aim to optimally control blood pressure. BP appears to be well controlled today.   4. Small secundum ASD: This appears to be small and is not likely to be causing significant hemodynamic problems. If her dyspnea on exertion were to increase in frequency or severity, I would consider right heart catheterization and shunt quantification vs cardiac MRI.   5. Valvular heart disease: see #1.   6. Sleep apnea: She continues to refuse CPAP.  7. Leg edema: Continue 12.5 mg HCTZ daily in addition to [place].  8. Essential HTN: Well controlled. Continue 12.5 mg HCTZ daily in addition to [place].  Dispo: f/u 1 year.   Kate Sable, M.D., F.A.C.C.

## 2014-10-21 NOTE — Patient Instructions (Signed)
Continue all current medications. Your physician wants you to follow up in:  1 year.  You will receive a reminder letter in the mail one-two months in advance.  If you don't receive a letter, please call our office to schedule the follow up appointment   

## 2014-12-20 DIAGNOSIS — R05 Cough: Secondary | ICD-10-CM | POA: Diagnosis not present

## 2015-03-08 DIAGNOSIS — M7541 Impingement syndrome of right shoulder: Secondary | ICD-10-CM | POA: Diagnosis not present

## 2015-03-14 DIAGNOSIS — I10 Essential (primary) hypertension: Secondary | ICD-10-CM | POA: Diagnosis not present

## 2015-03-14 DIAGNOSIS — Z7984 Long term (current) use of oral hypoglycemic drugs: Secondary | ICD-10-CM | POA: Diagnosis not present

## 2015-03-14 DIAGNOSIS — E1165 Type 2 diabetes mellitus with hyperglycemia: Secondary | ICD-10-CM | POA: Diagnosis not present

## 2015-03-14 DIAGNOSIS — E1140 Type 2 diabetes mellitus with diabetic neuropathy, unspecified: Secondary | ICD-10-CM | POA: Diagnosis not present

## 2015-03-14 DIAGNOSIS — Z794 Long term (current) use of insulin: Secondary | ICD-10-CM | POA: Diagnosis not present

## 2015-03-14 DIAGNOSIS — E785 Hyperlipidemia, unspecified: Secondary | ICD-10-CM | POA: Diagnosis not present

## 2015-03-20 ENCOUNTER — Ambulatory Visit: Payer: Medicare Other | Attending: Physician Assistant | Admitting: Physical Therapy

## 2015-03-20 DIAGNOSIS — M25511 Pain in right shoulder: Secondary | ICD-10-CM

## 2015-03-20 DIAGNOSIS — R29898 Other symptoms and signs involving the musculoskeletal system: Secondary | ICD-10-CM | POA: Diagnosis not present

## 2015-03-20 DIAGNOSIS — R5381 Other malaise: Secondary | ICD-10-CM | POA: Diagnosis not present

## 2015-03-20 NOTE — Therapy (Signed)
[facility] [place], [place], [address] Phone: [phone]   Fax:  [phone]  Physical Therapy Evaluation  Patient Details  Name: Melissa Zhang MRN: [id number] Date of Birth: [birth year] Referring Provider: Gerrit Halls Melissa-C.  Encounter Date: 03/20/2015      PT End of Session - 03/20/15 1501    Visit Number 1   Number of Visits 12   Date for PT Re-Evaluation 05/01/15   PT Start Time 0113   PT Stop Time 0158   PT Time Calculation (min) 45 min   Activity Tolerance Patient tolerated treatment well   Behavior During Therapy [facility] for tasks assessed/performed      Past Medical History  Diagnosis Date  . Type 2 diabetes mellitus   . Hypertension   . Low back pain   . Peripheral neuropathy   . Allergic rhinitis   . Esophageal reflux   . Obstructive sleep apnea   . Mixed hyperlipidemia     Past Surgical History  Procedure Laterality Date  . Cholecystectomy    . Abdominal hysterectomy    . Replacement total knee bilateral    . Cystectomy      LEFT BREAST    There were no vitals filed for this visit.  Visit Diagnosis:  Right shoulder pain - Plan: PT plan of care cert/re-cert  Weakness of shoulder - Plan: PT plan of care cert/re-cert  Debility - Plan: PT plan of care cert/re-cert      Subjective Assessment - 03/20/15 1331    Subjective Can't sleep on my right shoulder.   Limitations --  Reaching.   Patient Stated Goals Use my right UE and do hair wihtout pain.   Currently in Pain? Yes   Pain Score 6    Pain Location Shoulder   Pain Orientation Right   Pain Descriptors / Indicators Aching   Pain Type Acute pain   Pain Onset More than a month ago   Pain Frequency Constant   Aggravating Factors  Lying on right shoulder and certain right UE movements.   Pain Relieving Factors rest.            [facility] PT Assessment - 03/20/15 0001    Assessment   Medical Diagnosis Impingement of right shoulder.    Referring Provider Gerrit Halls Melissa-C.   Onset Date/Surgical Date --  3-4 months.   Precautions   Precautions None   Restrictions   Weight Bearing Restrictions No   Balance Screen   Has the patient fallen in the past 6 months No   Has the patient had a decrease in activity level because of a fear of falling?  No   Is the patient reluctant to leave their home because of a fear of falling?  No   Home Environment   Living Environment Private residence   Prior Function   Level of Independence Independent   Posture/Postural Control   Posture/Postural Control Postural limitations   Postural Limitations Rounded Shoulders;Forward head;Increased thoracic kyphosis   ROM / Strength   AROM / PROM / Strength AROM;Strength   AROM   Overall AROM Comments Right shoulder active flexion limited to 80 degrees with passive flexion to 147 degrees; active ER= 44 degrees and passive to 77 degrees.   Strength   Overall Strength Comments Right shoulder flexion and abduction strength= 2+ to 3-/5 and IR/ER= 3-/5.   Palpation   Palpation comment Tender at right ACJ but mostly at right acromial ridge and to a lesser  degree in right posterior cuff region.   Special Tests    Special Tests Rotator Cuff Impingement   Rotator Cuff Impingment tests Neer impingement test;Hawkins- Kennedy test;Drop Arm test   Neer Impingement test    Findings Positive   Side Right   Hawkins-Kennedy test   Findings Positive   Side Right   Drop Arm test   Findings Positive   Side Right   Ambulation/Gait   Gait Pattern Within Functional Limits                   [facility] Adult PT Treatment/Exercise - April 19, 2015 0001    Modalities   Modalities Electrical Stimulation;Moist Heat   Moist Heat Therapy   Number Minutes Moist Heat 15 Minutes   Moist Heat Location --  Right shoulder.   Acupuncturist Location --  Right shoulder.   Electrical Stimulation Action IFC   Electrical  Stimulation Parameters 80-150 Hz at 100% scan x 15 minutes.                  PT Short Term Goals - Apr 19, 2015 1504    PT SHORT TERM GOAL #1   Title Ind with an initial HEP.   Time 2   Period Weeks   Status New           PT Long Term Goals - 04/19/2015 1505    PT LONG TERM GOAL #1   Title Ind with an advanced HEP.   Time 4   Period Weeks   Status New   PT LONG TERM GOAL #2   Title Active right shoulder flexion to 145 degrees so the patient can easily reach overhead   Time 4   Period Weeks   Status New   PT LONG TERM GOAL #3   Title Active ER to 70 degrees+ to allow for easily donning/doffing of apparel and perform hair care easier.   Time 4   Period Weeks   Status New   PT LONG TERM GOAL #4   Title Increase right shoulder strength to a solid 4+/5 to increase stability for performance of functional activities   Time 4   Period Weeks   Status New   PT LONG TERM GOAL #5   Title Perform ADL's with pain not > 3/10.               Plan - 04/19/2015 1357    Clinical Impression Statement The patient has had ongoing right shoulder pain that has gotten worse over the last 3-4 months.  Her pain-level is a 5-6/10 today and [id number] with certain right shoulder movements and with any attempt to lie on her right shoulder.     Pt will benefit from skilled therapeutic intervention in order to improve on the following deficits Pain;Decreased activity tolerance;Decreased range of motion;Decreased strength   PT Frequency 3x / week   PT Duration 4 weeks   PT Treatment/Interventions ADLs/Self Care Home Management;Iontophoresis 4mg /ml Dexamethasone;Electrical Stimulation;Moist Heat;Therapeutic exercise;Therapeutic activities;Ultrasound;Patient/family education;Manual techniques   PT Next Visit Plan Combo e'stim/U/S to right acromial ridge region; start with AAROM to begin regaining strength; supine cane exercises.   Consulted and Agree with Plan of Care Patient           G-Codes - 2015/04/19 1509    Functional Assessment Tool Used FOTO...Marland KitchenMarland Kitchen35% limitation.   Functional Limitation Other PT primary   Other PT Primary Current Status [id number]) At least 20 percent but less than 40 percent impaired, limited or  restricted   Other PT Primary Goal Status [id number]) At least 1 percent but less than 20 percent impaired, limited or restricted       Problem List Patient Active Problem List   Diagnosis Date Noted  . SOB (shortness of breath) 06/07/2013  . Valvular heart disease 06/07/2013  . Diastolic dysfunction, left ventricle 06/07/2013    APPLEGATE, [place] MPT 03/20/2015, 3:16 PM  [facility] [address] [place], [place], [address] Phone: [phone]   Fax:  [phone]  Name: Melissa Zhang MRN: [id number] Date of Birth: March 06, 1940

## 2015-03-21 ENCOUNTER — Ambulatory Visit: Payer: Medicare Other | Admitting: *Deleted

## 2015-03-21 DIAGNOSIS — R29898 Other symptoms and signs involving the musculoskeletal system: Secondary | ICD-10-CM

## 2015-03-21 DIAGNOSIS — R5381 Other malaise: Secondary | ICD-10-CM | POA: Diagnosis not present

## 2015-03-21 DIAGNOSIS — M25511 Pain in right shoulder: Secondary | ICD-10-CM | POA: Diagnosis not present

## 2015-03-21 NOTE — Therapy (Signed)
[facility] [place], [place], [address] Phone: [phone]   Fax:  [phone]  Physical Therapy Treatment  Patient Details  Name: Melissa Zhang MRN: [id number] Date of Birth: November 08, 1940 Referring Provider: Gerrit Halls PA-C.  Encounter Date: 03/21/2015      PT End of Session - 03/21/15 1427    Visit Number 2   Number of Visits 12   Date for PT Re-Evaluation 05/01/15   PT Start Time [id number]   PT Stop Time [id number]   PT Time Calculation (min) 52 min      Past Medical History  Diagnosis Date  . Type 2 diabetes mellitus   . Hypertension   . Low back pain   . Peripheral neuropathy   . Allergic rhinitis   . Esophageal reflux   . Obstructive sleep apnea   . Mixed hyperlipidemia     Past Surgical History  Procedure Laterality Date  . Cholecystectomy    . Abdominal hysterectomy    . Replacement total knee bilateral    . Cystectomy      LEFT BREAST    There were no vitals filed for this visit.  Visit Diagnosis:  Right shoulder pain  Weakness of shoulder  Debility      Subjective Assessment - 03/21/15 1425    Subjective Can't sleep on my right shoulder.  Hurts to reach out   Patient Stated Goals Use my right UE and do hair wihtout pain.   Currently in Pain? Yes   Pain Score 6    Pain Location Shoulder   Pain Orientation Right   Pain Descriptors / Indicators Aching   Pain Type Acute pain   Pain Onset More than a month ago   Pain Frequency Constant                         [facility] Adult PT Treatment/Exercise - 03/21/15 0001    Exercises   Exercises Shoulder   Shoulder Exercises: Supine   Flexion AAROM;Both;20 reps;10 reps  cane press   Modalities   Modalities Electrical Stimulation;Moist Heat;Ultrasound   Moist Heat Therapy   Number Minutes Moist Heat 15 Minutes   Moist Heat Location Shoulder  Right shoulder.   Acupuncturist Location --  Right  shoulder.   Electrical Stimulation Action IFC   Electrical Stimulation Parameters 80-150HZ    Ultrasound   Ultrasound Location RT shldr   Ultrasound Parameters 1.5 w/cm2 x 10 mins   Ultrasound Goals Pain   Manual Therapy   Manual Therapy Passive ROM   Passive ROM PROM to RT shldr for elevation, ER/IR in supine                  PT Short Term Goals - 03/20/15 1504    PT SHORT TERM GOAL #1   Title Ind with an initial HEP.   Time 2   Period Weeks   Status New           PT Long Term Goals - 03/20/15 1505    PT LONG TERM GOAL #1   Title Ind with an advanced HEP.   Time 4   Period Weeks   Status New   PT LONG TERM GOAL #2   Title Active right shoulder flexion to 145 degrees so the patient can easily reach overhead   Time 4   Period Weeks   Status New   PT LONG TERM GOAL #3  Title Active ER to 70 degrees+ to allow for easily donning/doffing of apparel and perform hair care easier.   Time 4   Period Weeks   Status New   PT LONG TERM GOAL #4   Title Increase right shoulder strength to a solid 4+/5 to increase stability for performance of functional activities   Time 4   Period Weeks   Status New   PT LONG TERM GOAL #5   Title Perform ADL's with pain not > 3/10.               Plan - 03/21/15 1428    Clinical Impression Statement Pt did fair today with Rx. She did well with all modalities and had normal responses, but Supine cane press-ups were a little challenging for her due to pain. She was   Pt will benefit from skilled therapeutic intervention in order to improve on the following deficits Pain;Decreased activity tolerance;Decreased range of motion;Decreased strength   PT Frequency 3x / week   PT Duration 4 weeks   PT Treatment/Interventions ADLs/Self Care Home Management;Iontophoresis 4mg /ml Dexamethasone;Electrical Stimulation;Moist Heat;Therapeutic exercise;Therapeutic activities;Ultrasound;Patient/family education;Manual techniques   PT Next Visit  Plan Combo e'stim/U/S to right acromial ridge region; start with AAROM to begin regaining strength; supine cane exercises.   Consulted and Agree with Plan of Care Patient          G-Codes - 04/05/2015 1509    Functional Assessment Tool Used FOTO...Marland KitchenMarland Kitchen35% limitation.   Functional Limitation Other PT primary   Other PT Primary Current Status [id number]) At least 20 percent but less than 40 percent impaired, limited or restricted   Other PT Primary Goal Status [id number]) At least 1 percent but less than 20 percent impaired, limited or restricted      Problem List Patient Active Problem List   Diagnosis Date Noted  . SOB (shortness of breath) 06/07/2013  . Valvular heart disease 06/07/2013  . Diastolic dysfunction, left ventricle 06/07/2013    RAMSEUR,CHRIS, PTA 03/21/2015, 6:13 PM  [facility] [place], [place], [address] Phone: [phone]   Fax:  [phone]  Name: Melissa Zhang MRN: [id number] Date of Birth: February 09, 1941

## 2015-03-28 ENCOUNTER — Ambulatory Visit: Payer: Medicare Other | Attending: Physician Assistant | Admitting: Physical Therapy

## 2015-03-28 ENCOUNTER — Encounter: Payer: Self-pay | Admitting: Physical Therapy

## 2015-03-28 DIAGNOSIS — M25511 Pain in right shoulder: Secondary | ICD-10-CM | POA: Diagnosis not present

## 2015-03-28 DIAGNOSIS — R5381 Other malaise: Secondary | ICD-10-CM | POA: Diagnosis not present

## 2015-03-28 DIAGNOSIS — R29898 Other symptoms and signs involving the musculoskeletal system: Secondary | ICD-10-CM

## 2015-03-28 NOTE — Therapy (Signed)
[facility] [place], [place], [address] Phone: [phone]   Fax:  [phone]  Physical Therapy Treatment  Patient Details  Name: Melissa Zhang MRN: [id number] Date of Birth: 1940/11/18 Referring Provider: Gerrit Halls PA-C.  Encounter Date: 03/28/2015      PT End of Session - 03/28/15 1356    Visit Number 3   Number of Visits 12   Date for PT Re-Evaluation 05/01/15   PT Start Time [id number]   PT Stop Time 1438   PT Time Calculation (min) 46 min   Activity Tolerance Patient tolerated treatment well   Behavior During Therapy [facility] for tasks assessed/performed      Past Medical History  Diagnosis Date  . Type 2 diabetes mellitus ([place])   . Hypertension   . Low back pain   . Peripheral neuropathy ([place])   . Allergic rhinitis   . Esophageal reflux   . Obstructive sleep apnea   . Mixed hyperlipidemia     Past Surgical History  Procedure Laterality Date  . Cholecystectomy    . Abdominal hysterectomy    . Replacement total knee bilateral    . Cystectomy      LEFT BREAST    There were no vitals filed for this visit.  Visit Diagnosis:  Right shoulder pain  Weakness of shoulder  Debility      Subjective Assessment - 03/28/15 1354    Subjective Goes to MD 04/19/2015 to assess R shoulder. Reports that R shoulder feels like there is a catch.   Patient Stated Goals Use my right UE and do hair wihtout pain.   Currently in Pain? Other (Comment)  Reports a catching sensation but gave no numerical pain rating.            [facility] PT Assessment - 03/28/15 0001    Assessment   Medical Diagnosis Impingement of right shoulder.   Next MD Visit 04/19/2015   Precautions   Precautions None                     [facility] Adult PT Treatment/Exercise - 03/28/15 0001    Shoulder Exercises: Supine   Flexion AAROM;Both;10 reps  Hands close together; Felt grinding sensatoin   Shoulder Exercises: Seated   External  Rotation AAROM;Right;20 reps   Shoulder Exercises: Pulleys   Flexion Other (comment)  x5 min   Modalities   Modalities Electrical Stimulation;Moist Heat   Moist Heat Therapy   Number Minutes Moist Heat 15 Minutes   Moist Heat Location Shoulder   Electrical Stimulation   Electrical Stimulation Location R shoulder   Electrical Stimulation Action IFC   Electrical Stimulation Parameters 80-150 Hz x15 min   Electrical Stimulation Goals Pain   Manual Therapy   Manual Therapy Passive ROM   Passive ROM PROM of R shoulder into flexion, ER in supine with gentle holds at end range                  PT Short Term Goals - 03/28/15 1447    PT SHORT TERM GOAL #1   Title Ind with an initial HEP.   Time 2   Period Weeks   Status Achieved           PT Long Term Goals - 03/20/15 1505    PT LONG TERM GOAL #1   Title Ind with an advanced HEP.   Time 4   Period Weeks   Status New   PT LONG TERM  GOAL #2   Title Active right shoulder flexion to 145 degrees so the patient can easily reach overhead   Time 4   Period Weeks   Status New   PT LONG TERM GOAL #3   Title Active ER to 70 degrees+ to allow for easily donning/doffing of apparel and perform hair care easier.   Time 4   Period Weeks   Status New   PT LONG TERM GOAL #4   Title Increase right shoulder strength to a solid 4+/5 to increase stability for performance of functional activities   Time 4   Period Weeks   Status New   PT LONG TERM GOAL #5   Title Perform ADL's with pain not > 3/10.               Plan - 03/28/15 1426    Clinical Impression Statement Patient tolerated today's treatment fairly well although she experienced increased difficulty with AAROM flexion and chest press with a pull in anterior R shoulder. Firm end feels noted in both directions of PROM into flexion and ER but R shoulder flexion began tightening at approximately >90 deg. Patient noted during PROM of R shoulder that RUE was beginning to  feel fatigued. Normal modalities response noted following removal of the modalities. Experienced decreased R shoulder tightness and pain following today's treatment per patient report.   Pt will benefit from skilled therapeutic intervention in order to improve on the following deficits Pain;Decreased activity tolerance;Decreased range of motion;Decreased strength   PT Frequency 3x / week   PT Duration 4 weeks   PT Treatment/Interventions ADLs/Self Care Home Management;Iontophoresis 4mg /ml Dexamethasone;Electrical Stimulation;Moist Heat;Therapeutic exercise;Therapeutic activities;Ultrasound;Patient/family education;Manual techniques   PT Next Visit Plan Combo e'stim/U/S to right acromial ridge region; start with AAROM to begin regaining strength; supine cane exercises.   PT Home Exercise Plan Cane exerciss given in previous treatment   Consulted and Agree with Plan of Care Patient        Problem List Patient Active Problem List   Diagnosis Date Noted  . SOB (shortness of breath) 06/07/2013  . Valvular heart disease 06/07/2013  . Diastolic dysfunction, left ventricle 06/07/2013    Wynelle Fanny, PTA 03/28/2015, 2:49 PM  [facility] [place], [place], [address] Phone: [phone]   Fax:  [phone]  Name: Melissa Zhang MRN: [id number] Date of Birth: 06/13/1940

## 2015-03-30 ENCOUNTER — Ambulatory Visit: Payer: Medicare Other | Admitting: Physical Therapy

## 2015-03-30 ENCOUNTER — Encounter: Payer: Self-pay | Admitting: Physical Therapy

## 2015-03-30 DIAGNOSIS — M25511 Pain in right shoulder: Secondary | ICD-10-CM

## 2015-03-30 DIAGNOSIS — R29898 Other symptoms and signs involving the musculoskeletal system: Secondary | ICD-10-CM | POA: Diagnosis not present

## 2015-03-30 DIAGNOSIS — R5381 Other malaise: Secondary | ICD-10-CM

## 2015-03-30 NOTE — Therapy (Signed)
[facility] [place], [place], [address] Phone: [phone]   Fax:  [phone]  Physical Therapy Treatment  Patient Details  Name: Melissa Zhang MRN: [id number] Date of Birth: [birth year] Referring Provider: Gerrit Halls PA-C.  Encounter Date: 03/30/2015      PT End of Session - 03/30/15 1348    Visit Number 4   Number of Visits 12   Date for PT Re-Evaluation 05/01/15   PT Start Time 1346   PT Stop Time 1436   PT Time Calculation (min) 50 min   Activity Tolerance Patient tolerated treatment well   Behavior During Therapy [facility] for tasks assessed/performed      Past Medical History  Diagnosis Date  . Type 2 diabetes mellitus ([place])   . Hypertension   . Low back pain   . Peripheral neuropathy ([place])   . Allergic rhinitis   . Esophageal reflux   . Obstructive sleep apnea   . Mixed hyperlipidemia     Past Surgical History  Procedure Laterality Date  . Cholecystectomy    . Abdominal hysterectomy    . Replacement total knee bilateral    . Cystectomy      LEFT BREAST    There were no vitals filed for this visit.  Visit Diagnosis:  Right shoulder pain  Weakness of shoulder  Debility      Subjective Assessment - 03/30/15 1347    Subjective Reports that her shoulder is stiff and a little painful today. Reports that she thinks her R shoulder is "some" better.   Patient Stated Goals Use my right UE and do hair wihtout pain.   Currently in Pain? Yes   Pain Score 4    Pain Location Shoulder   Pain Orientation Right   Pain Descriptors / Indicators Other (Comment)  Stiffness   Pain Type Acute pain   Pain Onset More than a month ago            [facility] PT Assessment - 03/30/15 0001    Assessment   Medical Diagnosis Impingement of right shoulder.   Next MD Visit 04/19/2015   Precautions   Precautions None                     [facility] Adult PT Treatment/Exercise - 03/30/15 0001    Shoulder  Exercises: Standing   External Rotation Strengthening;Right;20 reps;Theraband   Theraband Level (Shoulder External Rotation) Level 1 (Yellow)   Row Strengthening;Right;Theraband  3 reps; reported increased R shoulder pain   Theraband Level (Shoulder Row) Level 1 (Yellow)   Shoulder Exercises: Pulleys   Flexion Other (comment)  4 min   Shoulder Exercises: Therapy Ball   Flexion 25 reps   Other Therapy Ball Exercises 25 reps circles RUE red theraball   Shoulder Exercises: ROM/Strengthening   UBE (Upper Arm Bike) 120 RPM x8 min   Modalities   Modalities Electrical Stimulation;Moist Heat   Moist Heat Therapy   Number Minutes Moist Heat 15 Minutes   Moist Heat Location Shoulder   Electrical Stimulation   Electrical Stimulation Location R shoulder   Electrical Stimulation Action Pre-Mod   Electrical Stimulation Parameters 80-150 Hz x15 min   Electrical Stimulation Goals Pain   Manual Therapy   Manual Therapy Passive ROM   Passive ROM PROM of R shoulder into flexion, ER in supine with gentle holds at end range                  PT  Short Term Goals - 03/28/15 1447    PT SHORT TERM GOAL #1   Title Ind with an initial HEP.   Time 2   Period Weeks   Status Achieved           PT Long Term Goals - 03/20/15 1505    PT LONG TERM GOAL #1   Title Ind with an advanced HEP.   Time 4   Period Weeks   Status New   PT LONG TERM GOAL #2   Title Active right shoulder flexion to 145 degrees so the patient can easily reach overhead   Time 4   Period Weeks   Status New   PT LONG TERM GOAL #3   Title Active ER to 70 degrees+ to allow for easily donning/doffing of apparel and perform hair care easier.   Time 4   Period Weeks   Status New   PT LONG TERM GOAL #4   Title Increase right shoulder strength to a solid 4+/5 to increase stability for performance of functional activities   Time 4   Period Weeks   Status New   PT LONG TERM GOAL #5   Title Perform ADL's with pain not  > 3/10.               Plan - 03/30/15 1352    Clinical Impression Statement Patient tolerated today's treatment fairly well although she experienced R shoulder stiffness prior to today's treatment. Patient experienced R anterior shoulder pain with R shoulder ER with yellow theraband for shoulder postural strengthening. Patient experience R shoulder pain with R shoulder row with yellow theraband and exercise was discontinued. Firm end feels noted with R shoulder PROM into flexion and ER and only very minimal facial grimacing noted with PROM into flexion. While unbuttoning buttons at the top of her shirt today patient conducted R shoulder horizontal adduction and experienced pain with that movement. Normal modalities response noted following removal of the modalities. Goals remain on-going at this time secondary to pain, lack of R shoulder ROM and strength. Experienced R shoulder stiffness and pain following today' s treatment per patient report.   Pt will benefit from skilled therapeutic intervention in order to improve on the following deficits Pain;Decreased activity tolerance;Decreased range of motion;Decreased strength   PT Frequency 3x / week   PT Duration 4 weeks   PT Treatment/Interventions ADLs/Self Care Home Management;Iontophoresis 4mg /ml Dexamethasone;Electrical Stimulation;Moist Heat;Therapeutic exercise;Therapeutic activities;Ultrasound;Patient/family education;Manual techniques   PT Next Visit Plan Combo e'stim/U/S to right acromial ridge region; start with AAROM to begin regaining strength; supine cane exercises.   PT Home Exercise Plan Cane exerciss given in previous treatment   Consulted and Agree with Plan of Care Patient        Problem List Patient Active Problem List   Diagnosis Date Noted  . SOB (shortness of breath) 06/07/2013  . Valvular heart disease 06/07/2013  . Diastolic dysfunction, left ventricle 06/07/2013    Wynelle Fanny, PTA 03/30/2015, 2:51  PM  [facility] [address] [place], [place], [address] Phone: [phone]   Fax:  [phone]  Name: Melissa Zhang MRN: [id number] Date of Birth: 1940/06/21

## 2015-04-04 ENCOUNTER — Ambulatory Visit: Payer: Medicare Other | Admitting: Physical Therapy

## 2015-04-04 ENCOUNTER — Encounter: Payer: Self-pay | Admitting: Physical Therapy

## 2015-04-04 DIAGNOSIS — R29898 Other symptoms and signs involving the musculoskeletal system: Secondary | ICD-10-CM

## 2015-04-04 DIAGNOSIS — M25511 Pain in right shoulder: Secondary | ICD-10-CM | POA: Diagnosis not present

## 2015-04-04 DIAGNOSIS — R5381 Other malaise: Secondary | ICD-10-CM | POA: Diagnosis not present

## 2015-04-04 NOTE — Therapy (Signed)
[facility] [place], [place], [address] Phone: [phone]   Fax:  [phone]  Physical Therapy Treatment  Patient Details  Name: Melissa Zhang MRN: [id number] Date of Birth: [birth year] Referring Provider: Gerrit Halls PA-C.  Encounter Date: 04/04/2015      PT End of Session - 04/04/15 1515    Visit Number 5   Number of Visits 12   Date for PT Re-Evaluation 05/01/15   PT Start Time 1446   PT Stop Time 1528   PT Time Calculation (min) 42 min   Activity Tolerance Patient tolerated treatment well   Behavior During Therapy [facility] for tasks assessed/performed      Past Medical History  Diagnosis Date  . Type 2 diabetes mellitus ([place])   . Hypertension   . Low back pain   . Peripheral neuropathy ([place])   . Allergic rhinitis   . Esophageal reflux   . Obstructive sleep apnea   . Mixed hyperlipidemia     Past Surgical History  Procedure Laterality Date  . Cholecystectomy    . Abdominal hysterectomy    . Replacement total knee bilateral    . Cystectomy      LEFT BREAST    There were no vitals filed for this visit.  Visit Diagnosis:  Right shoulder pain  Weakness of shoulder  Debility      Subjective Assessment - 04/04/15 1455    Subjective Patient has soreness in shoulder, "not much better".   Patient Stated Goals Use my right UE and do hair wihtout pain.   Currently in Pain? Yes   Pain Score 4    Pain Location Shoulder   Pain Orientation Right   Pain Type Acute pain   Pain Onset More than a month ago   Pain Frequency Constant   Aggravating Factors  increased activity   Pain Relieving Factors rest            [facility] PT Assessment - 04/04/15 0001    ROM / Strength   AROM / PROM / Strength AROM;PROM   AROM   AROM Assessment Site Shoulder   Right/Left Shoulder Right   Right Shoulder Flexion 100 Degrees   PROM   PROM Assessment Site Shoulder   Right/Left Shoulder Right   Right Shoulder  Flexion 136 Degrees   Right Shoulder External Rotation 70 Degrees                     [facility] Adult PT Treatment/Exercise - 04/04/15 0001    Shoulder Exercises: Standing   External Rotation Strengthening;Right  2x10   Theraband Level (Shoulder External Rotation) Level 1 (Yellow)   Internal Rotation Strengthening;Right  2x10   Theraband Level (Shoulder Internal Rotation) Level 1 (Yellow)   Flexion Strengthening;Right  2x10   Theraband Level (Shoulder Flexion) Level 1 (Yellow)   Extension Strengthening;Right  2x10   Theraband Level (Shoulder Extension) Level 1 (Yellow)   Shoulder Exercises: ROM/Strengthening   UBE (Upper Arm Bike) 120 RPM x8 min   Moist Heat Therapy   Number Minutes Moist Heat 15 Minutes   Moist Heat Location Shoulder   Electrical Stimulation   Electrical Stimulation Location R shoulder   Electrical Stimulation Action premod   Electrical Stimulation Parameters 80-150hz    Electrical Stimulation Goals Pain   Manual Therapy   Manual Therapy Passive ROM   Passive ROM PROM of R shoulder into flexion, ER in supine with gentle holds at end range, rhythmis stabs for IR/ER in  scaption                  PT Short Term Goals - 03/28/15 1447    PT SHORT TERM GOAL #1   Title Ind with an initial HEP.   Time 2   Period Weeks   Status Achieved           PT Long Term Goals - 03/20/15 1505    PT LONG TERM GOAL #1   Title Ind with an advanced HEP.   Time 4   Period Weeks   Status New   PT LONG TERM GOAL #2   Title Active right shoulder flexion to 145 degrees so the patient can easily reach overhead   Time 4   Period Weeks   Status New   PT LONG TERM GOAL #3   Title Active ER to 70 degrees+ to allow for easily donning/doffing of apparel and perform hair care easier.   Time 4   Period Weeks   Status New   PT LONG TERM GOAL #4   Title Increase right shoulder strength to a solid 4+/5 to increase stability for performance of functional  activities   Time 4   Period Weeks   Status New   PT LONG TERM GOAL #5   Title Perform ADL's with pain not > 3/10.               Plan - 04/04/15 1517    Clinical Impression Statement Patient tolerated treatment well today and had some soreness with overhead movement in supine. Patient continues to report sorness and difficulty using right UE for house cleaning /ADL's and doing her hair. Patient has increased AROM yet unable to meet any further goals today due to pain, ROM and strength deficits.   Pt will benefit from skilled therapeutic intervention in order to improve on the following deficits Pain;Decreased activity tolerance;Decreased range of motion;Decreased strength   PT Frequency 3x / week   PT Duration 4 weeks   PT Treatment/Interventions ADLs/Self Care Home Management;Iontophoresis 4mg /ml Dexamethasone;Electrical Stimulation;Moist Heat;Therapeutic exercise;Therapeutic activities;Ultrasound;Patient/family education;Manual techniques   PT Next Visit Plan Combo e'stim/U/S to right acromial ridge region; start with AAROM to begin regaining strength; supine cane exercises.   Consulted and Agree with Plan of Care Patient        Problem List Patient Active Problem List   Diagnosis Date Noted  . SOB (shortness of breath) 06/07/2013  . Valvular heart disease 06/07/2013  . Diastolic dysfunction, left ventricle 06/07/2013    Seymour Pavlak P, PTA 04/04/2015, 3:28 PM  [facility] [place], [place], [address] Phone: [phone]   Fax:  [phone]  Name: Melissa Zhang MRN: [id number] Date of Birth: 03-14-1940

## 2015-04-06 ENCOUNTER — Encounter: Payer: Self-pay | Admitting: Physical Therapy

## 2015-04-06 ENCOUNTER — Ambulatory Visit: Payer: Medicare Other | Admitting: Physical Therapy

## 2015-04-06 DIAGNOSIS — R29898 Other symptoms and signs involving the musculoskeletal system: Secondary | ICD-10-CM | POA: Diagnosis not present

## 2015-04-06 DIAGNOSIS — R5381 Other malaise: Secondary | ICD-10-CM | POA: Diagnosis not present

## 2015-04-06 DIAGNOSIS — M25511 Pain in right shoulder: Secondary | ICD-10-CM

## 2015-04-06 NOTE — Therapy (Addendum)
[facility] [place], [place], [address] Phone: [phone]   Fax:  [phone]  Physical Therapy Treatment  Patient Details  Name: Melissa Zhang MRN: [id number] Date of Birth: Jul 06, 1940 Referring Provider: Gerrit Halls PA-C.  Encounter Date: 04/06/2015    Past Medical History:  Diagnosis Date  . Allergic rhinitis   . Esophageal reflux   . Hypertension   . Low back pain   . Mixed hyperlipidemia   . Obstructive sleep apnea   . Peripheral neuropathy ([place])   . Pneumonia    history  . Type 2 diabetes mellitus ([place])     Past Surgical History:  Procedure Laterality Date  . ABDOMINAL HYSTERECTOMY    . CHOLECYSTECTOMY    . COLONOSCOPY    . CYSTECTOMY     LEFT BREAST  . EYE SURGERY Bilateral    caratacts  . REPLACEMENT TOTAL KNEE BILATERAL    . REVERSE SHOULDER ARTHROPLASTY Right 09/29/2015   Procedure: RIGHT REVERSE TOTAL SHOULDER ARTHROPLASTY;  Surgeon: Netta Cedars, MD;  Location: [facility];  Service: Orthopedics;  Laterality: Right;    There were no vitals filed for this visit.  Visit Diagnosis:  Right shoulder pain  Weakness of shoulder  Debility                                 PT Short Term Goals - 03/28/15 1447      PT SHORT TERM GOAL #1   Title Ind with an initial HEP.   Time 2   Period Weeks   Status Achieved           PT Long Term Goals - 03/20/15 1505      PT LONG TERM GOAL #1   Title Ind with an advanced HEP.   Time 4   Period Weeks   Status New     PT LONG TERM GOAL #2   Title Active right shoulder flexion to 145 degrees so the patient can easily reach overhead   Time 4   Period Weeks   Status New     PT LONG TERM GOAL #3   Title Active ER to 70 degrees+ to allow for easily donning/doffing of apparel and perform hair care easier.   Time 4   Period Weeks   Status New     PT LONG TERM GOAL #4   Title Increase right shoulder strength to a solid 4+/5  to increase stability for performance of functional activities   Time 4   Period Weeks   Status New     PT LONG TERM GOAL #5   Title Perform ADL's with pain not > 3/10.               Problem List Patient Active Problem List   Diagnosis Date Noted  . S/P shoulder replacement 09/29/2015  . SOB (shortness of breath) 06/07/2013  . Valvular heart disease 06/07/2013  . Diastolic dysfunction, left ventricle 06/07/2013    Ahmed Prima, PTA 03/01/16 5:33 PM Mali Applegate MPT [facility] [place], [place], [address] Phone: [phone]   Fax:  [phone]  Name: Melissa Zhang MRN: [id number] Date of Birth: 26-Apr-1940  PHYSICAL THERAPY DISCHARGE SUMMARY  Visits from Start of Care: 6.  Current functional level related to goals / functional outcomes: See above.   Remaining deficits: Continued right shoulder pain and loss of motion and strength.  Education / Equipment: HEP. Plan: Patient agrees to discharge.  Patient goals were not met. Patient is being discharged due to not returning since the last visit.  ?????         Mali Applegate MPT

## 2015-04-19 DIAGNOSIS — M7541 Impingement syndrome of right shoulder: Secondary | ICD-10-CM | POA: Diagnosis not present

## 2015-05-17 DIAGNOSIS — M7541 Impingement syndrome of right shoulder: Secondary | ICD-10-CM | POA: Diagnosis not present

## 2015-05-29 DIAGNOSIS — M25511 Pain in right shoulder: Secondary | ICD-10-CM | POA: Diagnosis not present

## 2015-06-14 DIAGNOSIS — M19011 Primary osteoarthritis, right shoulder: Secondary | ICD-10-CM | POA: Diagnosis not present

## 2015-06-27 DIAGNOSIS — M75121 Complete rotator cuff tear or rupture of right shoulder, not specified as traumatic: Secondary | ICD-10-CM | POA: Diagnosis not present

## 2015-06-27 DIAGNOSIS — M7541 Impingement syndrome of right shoulder: Secondary | ICD-10-CM | POA: Diagnosis not present

## 2015-06-27 DIAGNOSIS — M19011 Primary osteoarthritis, right shoulder: Secondary | ICD-10-CM | POA: Diagnosis not present

## 2015-07-26 ENCOUNTER — Telehealth: Payer: Self-pay | Admitting: *Deleted

## 2015-07-26 DIAGNOSIS — I38 Endocarditis, valve unspecified: Secondary | ICD-10-CM

## 2015-07-26 NOTE — Telephone Encounter (Signed)
Received fax from [facility] - Dr. Esmond Plants requesting clearance for right shoulder:  Right reverse total shoulder arthroplasty.  Last seen September 2016.

## 2015-07-27 NOTE — Telephone Encounter (Signed)
Valvular heart disease

## 2015-07-27 NOTE — Telephone Encounter (Signed)
What is diagnosis please?

## 2015-07-27 NOTE — Telephone Encounter (Signed)
Ok to proceed. Would get an echocardiogram beforehand.

## 2015-07-31 NOTE — Telephone Encounter (Signed)
Patient notified this past Friday.  Agrees to doing the Echo.  Will forward to scheduling.

## 2015-08-01 DIAGNOSIS — Z01818 Encounter for other preprocedural examination: Secondary | ICD-10-CM | POA: Diagnosis not present

## 2015-08-01 DIAGNOSIS — E1140 Type 2 diabetes mellitus with diabetic neuropathy, unspecified: Secondary | ICD-10-CM | POA: Diagnosis not present

## 2015-08-01 DIAGNOSIS — E039 Hypothyroidism, unspecified: Secondary | ICD-10-CM | POA: Diagnosis not present

## 2015-08-01 DIAGNOSIS — Z96611 Presence of right artificial shoulder joint: Secondary | ICD-10-CM | POA: Diagnosis not present

## 2015-08-01 DIAGNOSIS — Z794 Long term (current) use of insulin: Secondary | ICD-10-CM | POA: Diagnosis not present

## 2015-08-01 DIAGNOSIS — G4733 Obstructive sleep apnea (adult) (pediatric): Secondary | ICD-10-CM | POA: Diagnosis not present

## 2015-08-01 DIAGNOSIS — Z7984 Long term (current) use of oral hypoglycemic drugs: Secondary | ICD-10-CM | POA: Diagnosis not present

## 2015-08-01 DIAGNOSIS — I10 Essential (primary) hypertension: Secondary | ICD-10-CM | POA: Diagnosis not present

## 2015-08-09 ENCOUNTER — Other Ambulatory Visit: Payer: Self-pay

## 2015-08-09 ENCOUNTER — Ambulatory Visit (INDEPENDENT_AMBULATORY_CARE_PROVIDER_SITE_OTHER): Payer: Medicare Other

## 2015-08-09 DIAGNOSIS — I38 Endocarditis, valve unspecified: Secondary | ICD-10-CM

## 2015-08-09 LAB — ECHOCARDIOGRAM COMPLETE
AVPHT: 476 ms
CHL CUP LVOT MV VTI INDEX: 0.69 cm2/m2
CHL CUP LVOT MV VTI: 1.6
CHL CUP MV DEC (S): 145
CHL CUP STROKE VOLUME: 57 mL
E/e' ratio: 21.25
EWDT: 145 ms
FS: 16 % — AB (ref 28–44)
IV/PV OW: 0.89
LA ID, A-P, ES: 44 mm
LA diam end sys: 44 mm
LA diam index: 1.9 cm/m2
LA vol A4C: 96.6 ml
LA vol index: 39.6 mL/m2
LAVOL: 91.5 mL
LV E/e' medial: 21.25
LV E/e'average: 21.25
LV PW d: 11.4 mm — AB (ref 0.6–1.1)
LV dias vol: 123 mL — AB (ref 46–106)
LV e' LATERAL: 8.99 cm/s
LV sys vol index: 28 mL/m2
LV sys vol: 66 mL — AB (ref 14–42)
LVDIAVOLIN: 53 mL/m2
LVOT VTI: 24.5 cm
LVOT area: 3.46 cm2
LVOT diameter: 21 mm
LVOT peak grad rest: 5 mmHg
LVOT peak vel: 117 cm/s
LVOTSV: 85 mL
MRPISAEROA: 0.44 cm2
MV M vel: 120
MV Peak grad: 15 mmHg
MV VTI: 174 cm
MV pk A vel: 114 m/s
MVANNULUSVTI: 52.9 cm
MVPKEVEL: 191 m/s
Mean grad: 7 mmHg
RV LATERAL S' VELOCITY: 12 cm/s
RV TAPSE: 22.4 mm
Reg peak vel: 384 cm/s
Simpson's disk: 47
TDI e' lateral: 8.99
TDI e' medial: 6.38
TR max vel: 384 cm/s

## 2015-08-10 ENCOUNTER — Telehealth: Payer: Self-pay | Admitting: Cardiovascular Disease

## 2015-08-10 NOTE — Telephone Encounter (Signed)
Pt can proceed with surgery without any additional CV testing.

## 2015-08-10 NOTE — Telephone Encounter (Signed)
Notes Recorded by Laurine Blazer, LPN on [id number] at [id number] PM Patient notified and verbalized understanding. Copy to pmd & [facility].  Notes Recorded by Herminio Commons, MD on 08/10/2015 at 11:12 AM Normal pumping function, stiffness with relaxation, and significant mitral and tricuspid valvular disease.

## 2015-08-13 DIAGNOSIS — M549 Dorsalgia, unspecified: Secondary | ICD-10-CM | POA: Diagnosis not present

## 2015-08-13 DIAGNOSIS — Z5321 Procedure and treatment not carried out due to patient leaving prior to being seen by health care provider: Secondary | ICD-10-CM | POA: Diagnosis not present

## 2015-08-14 DIAGNOSIS — M545 Low back pain: Secondary | ICD-10-CM | POA: Diagnosis not present

## 2015-09-12 NOTE — H&P (Signed)
  Melissa Zhang is an 75 y.o. female.    Chief Complaint: right shoulder pain and weakness  HPI: Pt is a 75 y.o. female complaining of right shoulder pain for multiple years. Pain had continually increased since the beginning. X-rays in the clinic show end-stage arthritic changes of the right shoulder. Pt has tried various conservative treatments which have failed to alleviate their symptoms, including injections and therapy. Various options are discussed with the patient. Risks, benefits and expectations were discussed with the patient. Patient understand the risks, benefits and expectations and wishes to proceed with surgery.   PCP:  Abigail Miyamoto, MD  D/C Plans: Home  PMH: Past Medical History:  Diagnosis Date  . Allergic rhinitis   . Esophageal reflux   . Hypertension   . Low back pain   . Mixed hyperlipidemia   . Obstructive sleep apnea   . Peripheral neuropathy ([place])   . Type 2 diabetes mellitus ([facility])     PSH: Past Surgical History:  Procedure Laterality Date  . ABDOMINAL HYSTERECTOMY    . CHOLECYSTECTOMY    . CYSTECTOMY     LEFT BREAST  . REPLACEMENT TOTAL KNEE BILATERAL      Social History:  reports that she has never smoked. She has never used smokeless tobacco. She reports that she does not drink alcohol or use drugs.  Allergies:  Allergies  Allergen Reactions  . Actos [Pioglitazone]   . Ciprofloxacin   . Lisinopril     Medications: No current facility-administered medications for this encounter.    Current Outpatient Prescriptions  Medication Sig Dispense Refill  . aspirin 81 MG tablet Take 81 mg by mouth daily.    . bisoprolol-hydrochlorothiazide (ZIAC) 5-6.25 MG per tablet Take 1 tablet by mouth daily.    . DULoxetine (CYMBALTA) 60 MG capsule Take 60 mg by mouth daily.    Marland Kitchen gabapentin (NEURONTIN) 300 MG capsule Take 300 mg by mouth at bedtime.    Marland Kitchen glimepiride (AMARYL) 4 MG tablet Take 4 mg by mouth 2 (two) times daily.    . hydrochlorothiazide  (MICROZIDE) 12.5 MG capsule Take 1 capsule (12.5 mg total) by mouth daily. 90 capsule 3  . LANTUS SOLOSTAR 100 UNIT/ML Solostar Pen Inject 25 Units into the skin daily.    Marland Kitchen levothyroxine (SYNTHROID, LEVOTHROID) 50 MCG tablet Take 50 mcg by mouth daily.    Marland Kitchen lovastatin (MEVACOR) 40 MG tablet Take 40 mg by mouth at bedtime.    . metFORMIN (GLUCOPHAGE) 1000 MG tablet Take 1,000 mg by mouth 2 (two) times daily.      No results found for this or any previous visit (from the past 48 hour(s)). No results found.  ROS: Pain with rom of the right upper extremity  Physical Exam:  Alert and oriented 75 y.o. female in no acute distress Cranial nerves 2-12 intact Cervical spine: full rom with no tenderness, nv intact distally Chest: active breath sounds bilaterally, no wheeze rhonchi or rales Heart: regular rate and rhythm, no murmur Abd: non tender non distended with active bowel sounds Hip is stable with rom  Right shoulder with limited rom and strength due to arthropathy nv intact distally No rashes   Assessment/Plan Assessment: right shoulder rotator cuff arthropathy  Plan: Patient will undergo a right reverse total shoulder by Dr. Veverly Fells at [facility]. Risks benefits and expectations were discussed with the patient. Patient understand risks, benefits and expectations and wishes to proceed.

## 2015-09-18 ENCOUNTER — Encounter (HOSPITAL_COMMUNITY): Payer: Self-pay

## 2015-09-18 NOTE — Pre-Procedure Instructions (Signed)
Melissa Zhang  09/18/2015      [id number] - [place], [place] [address] Phone: [phone] Fax: [phone]  [id number] - [place], [place] [address] [place] [address] Phone: [phone] Fax: [phone]    Your procedure is scheduled on August 11  Report to [facility] at 0830 A.M.  Call this number if you have problems the morning of surgery:  [phone]   Remember:  Do not eat food or drink liquids after midnight.   Take these medicines the morning of surgery with A SIP OF WATER bisoprolol (zebeta), duloxetine (cymbalta), gabapentin (neurontin), levothyroxine (synthroid)  7 days prior to surgery STOP taking any Aspirin, Aleve, Naproxen, Ibuprofen, Motrin, Advil, Goody's, BC's, all herbal medications, fish oil, and all vitamins  WHAT DO I DO ABOUT MY DIABETES MEDICATION?   Marland Kitchen Do not take oral diabetes medicines (pills) the morning of surgery. DO NOT TAKE METFORMIN and amaryl (glimepiride)  . THE NIGHT BEFORE SURGERY, take __50% or 30_________ units of __Lantus_________insulin.     How to Manage Your Diabetes Before and After Surgery  Why is it important to control my blood sugar before and after surgery? . Improving blood sugar levels before and after surgery helps healing and can limit problems. . A way of improving blood sugar control is eating a healthy diet by: o  Eating less sugar and carbohydrates o  Increasing activity/exercise o  Talking with your doctor about reaching your blood sugar goals . High blood sugars (greater than 180 mg/dL) can raise your risk of infections and slow your recovery, so you will need to focus on controlling your diabetes during the weeks before surgery. . Make sure that the doctor who takes care of your diabetes knows about your planned surgery including the date and location.  How do I manage my blood sugar before  surgery? . Check your blood sugar at least 4 times a day, starting 2 days before surgery, to make sure that the level is not too high or low. o Check your blood sugar the morning of your surgery when you wake up and every 2 hours until you get to the Short Stay unit. . If your blood sugar is less than 70 mg/dL, you will need to treat for low blood sugar: o Do not take insulin. o Treat a low blood sugar (less than 70 mg/dL) with  cup of clear juice (cranberry or apple), 4 glucose tablets, OR glucose gel. o Recheck blood sugar in 15 minutes after treatment (to make sure it is greater than 70 mg/dL). If your blood sugar is not greater than 70 mg/dL on recheck, call [phone] for further instructions. . Report your blood sugar to the short stay nurse when you get to Short Stay.  . If you are admitted to the hospital after surgery: o Your blood sugar will be checked by the staff and you will probably be given insulin after surgery (instead of oral diabetes medicines) to make sure you have good blood sugar levels. o The goal for blood sugar control after surgery is 80-180 mg/dL.      Do not wear jewelry, make-up or nail polish.  Do not wear lotions, powders, or perfumes.  You may NOT wear deoderant.  Do not shave 48 hours prior to surgery.    Do not bring valuables to the hospital.  [facility] is not responsible  for any belongings or valuables.  Contacts, dentures or bridgework may not be worn into surgery.  Leave your suitcase in the car.  After surgery it may be brought to your room.  For patients admitted to the hospital, discharge time will be determined by your treatment team.  Patients discharged the day of surgery will not be allowed to drive home.   Special instructions:   [facility]- Preparing For Surgery  Before surgery, you can play an important role. Because skin is not sterile, your skin needs to be as free of germs as possible. You can reduce the number of germs on your  skin by washing with CHG (chlorahexidine gluconate) Soap before surgery.  CHG is an antiseptic cleaner which kills germs and bonds with the skin to continue killing germs even after washing.  Please do not use if you have an allergy to CHG or antibacterial soaps. If your skin becomes reddened/irritated stop using the CHG.  Do not shave (including legs and underarms) for at least 48 hours prior to first CHG shower. It is OK to shave your face.  Please follow these instructions carefully.   1. Shower the NIGHT BEFORE SURGERY and the MORNING OF SURGERY with CHG.   2. If you chose to wash your hair, wash your hair first as usual with your normal shampoo.  3. After you shampoo, rinse your hair and body thoroughly to remove the shampoo.  4. Use CHG as you would any other liquid soap. You can apply CHG directly to the skin and wash gently with a scrungie or a clean washcloth.   5. Apply the CHG Soap to your body ONLY FROM THE NECK DOWN.  Do not use on open wounds or open sores. Avoid contact with your eyes, ears, mouth and genitals (private parts). Wash genitals (private parts) with your normal soap.  6. Wash thoroughly, paying special attention to the area where your surgery will be performed.  7. Thoroughly rinse your body with warm water from the neck down.  8. DO NOT shower/wash with your normal soap after using and rinsing off the CHG Soap.  9. Pat yourself dry with a CLEAN TOWEL.   10. Wear CLEAN PAJAMAS   11. Place CLEAN SHEETS on your bed the night of your first shower and DO NOT SLEEP WITH PETS.    Day of Surgery: Do not apply any deodorants/lotions. Please wear clean clothes to the hospital/surgery center.      Please read over the following fact sheets that you were given. Pain Booklet, Coughing and Deep Breathing, MRSA Information and Surgical Site Infection Prevention

## 2015-09-19 ENCOUNTER — Encounter (HOSPITAL_COMMUNITY): Payer: Self-pay

## 2015-09-19 ENCOUNTER — Encounter (HOSPITAL_COMMUNITY)
Admission: RE | Admit: 2015-09-19 | Discharge: 2015-09-19 | Disposition: A | Discharge status: Home / Self Care | Payer: Medicare Other | Source: Ambulatory Visit | Attending: Orthopedic Surgery | Admitting: Orthopedic Surgery

## 2015-09-19 DIAGNOSIS — E1142 Type 2 diabetes mellitus with diabetic polyneuropathy: Secondary | ICD-10-CM | POA: Diagnosis not present

## 2015-09-19 DIAGNOSIS — E782 Mixed hyperlipidemia: Secondary | ICD-10-CM | POA: Diagnosis not present

## 2015-09-19 DIAGNOSIS — Z79899 Other long term (current) drug therapy: Secondary | ICD-10-CM | POA: Diagnosis not present

## 2015-09-19 DIAGNOSIS — K219 Gastro-esophageal reflux disease without esophagitis: Secondary | ICD-10-CM | POA: Diagnosis not present

## 2015-09-19 DIAGNOSIS — Z794 Long term (current) use of insulin: Secondary | ICD-10-CM | POA: Diagnosis not present

## 2015-09-19 DIAGNOSIS — M19011 Primary osteoarthritis, right shoulder: Secondary | ICD-10-CM | POA: Diagnosis not present

## 2015-09-19 DIAGNOSIS — I10 Essential (primary) hypertension: Secondary | ICD-10-CM | POA: Insufficient documentation

## 2015-09-19 DIAGNOSIS — Z7982 Long term (current) use of aspirin: Secondary | ICD-10-CM | POA: Insufficient documentation

## 2015-09-19 DIAGNOSIS — Z01812 Encounter for preprocedural laboratory examination: Secondary | ICD-10-CM | POA: Diagnosis not present

## 2015-09-19 DIAGNOSIS — Z881 Allergy status to other antibiotic agents status: Secondary | ICD-10-CM | POA: Insufficient documentation

## 2015-09-19 DIAGNOSIS — G4733 Obstructive sleep apnea (adult) (pediatric): Secondary | ICD-10-CM | POA: Insufficient documentation

## 2015-09-19 DIAGNOSIS — Z888 Allergy status to other drugs, medicaments and biological substances status: Secondary | ICD-10-CM | POA: Diagnosis not present

## 2015-09-19 HISTORY — DX: Pneumonia, unspecified organism: J18.9

## 2015-09-19 LAB — CBC
HCT: 39.1 % (ref 36.0–46.0)
Hemoglobin: 12.1 g/dL (ref 12.0–15.0)
MCH: 28.7 pg (ref 26.0–34.0)
MCHC: 30.9 g/dL (ref 30.0–36.0)
MCV: 92.7 fL (ref 78.0–100.0)
Platelets: 276 10*3/uL (ref 150–400)
RBC: 4.22 MIL/uL (ref 3.87–5.11)
RDW: 14.6 % (ref 11.5–15.5)
WBC: 8.3 10*3/uL (ref 4.0–10.5)

## 2015-09-19 LAB — BASIC METABOLIC PANEL
Anion gap: 6 (ref 5–15)
BUN: 15 mg/dL (ref 6–20)
CHLORIDE: 107 mmol/L (ref 101–111)
CO2: 25 mmol/L (ref 22–32)
CREATININE: 1.03 mg/dL — AB (ref 0.44–1.00)
Calcium: 9.1 mg/dL (ref 8.9–10.3)
GFR, EST NON AFRICAN AMERICAN: 53 mL/min — AB (ref >60)
Glucose, Bld: 137 mg/dL — ABNORMAL HIGH (ref 65–99)
Potassium: 5.1 mmol/L (ref 3.5–5.1)
SODIUM: 138 mmol/L (ref 135–145)

## 2015-09-19 LAB — SURGICAL PCR SCREEN
MRSA, PCR: POSITIVE — AB
STAPHYLOCOCCUS AUREUS: POSITIVE — AB

## 2015-09-19 LAB — GLUCOSE, CAPILLARY: GLUCOSE-CAPILLARY: 149 mg/dL — AB (ref 65–99)

## 2015-09-19 NOTE — Progress Notes (Signed)
Patient positive for staph and MRSA, prescription called in at [facility] [phone], left message with patient

## 2015-09-19 NOTE — Progress Notes (Signed)
PCP - Rober Maceo [facility] Cardiologist - [place] - sees once a year  Chest x-ray - not needed EKG - 9-2/16 Stress Test - 06/15/13 ECHO - 08/09/15 Cardiac Cath - denies  Sleep study completed >7years ago - patient does not wear a CPAP Fasting blood glucose 76-79    Patient denies shortness of breath, fever, cough and chest pain at PAT appointment

## 2015-09-27 MED ORDER — BUPIVACAINE-EPINEPHRINE (PF) 0.5% -1:200000 IJ SOLN
INTRAMUSCULAR | Status: AC
  Filled 2015-09-27: qty 30

## 2015-09-28 MED ORDER — DEXTROSE 5 % IV SOLN
3.0000 g | INTRAVENOUS | Status: AC
  Administered 2015-09-29: 3 g via INTRAVENOUS
  Filled 2015-09-28: qty 3000

## 2015-09-29 ENCOUNTER — Inpatient Hospital Stay (HOSPITAL_COMMUNITY): Payer: Medicare Other

## 2015-09-29 ENCOUNTER — Inpatient Hospital Stay (HOSPITAL_COMMUNITY): Payer: Medicare Other | Admitting: Anesthesiology

## 2015-09-29 ENCOUNTER — Encounter (HOSPITAL_COMMUNITY): Payer: Self-pay | Admitting: *Deleted

## 2015-09-29 ENCOUNTER — Encounter (HOSPITAL_COMMUNITY)
Admission: RE | Disposition: A | Discharge status: Home Health | Payer: Self-pay | Source: Ambulatory Visit | Attending: Orthopedic Surgery

## 2015-09-29 ENCOUNTER — Inpatient Hospital Stay (HOSPITAL_COMMUNITY)
Admission: RE | Admit: 2015-09-29 | Discharge: 2015-10-01 | DRG: 483 | Disposition: A | Discharge status: Home Health | Payer: Medicare Other | Source: Ambulatory Visit | Attending: Orthopedic Surgery | Admitting: Orthopedic Surgery

## 2015-09-29 DIAGNOSIS — M75101 Unspecified rotator cuff tear or rupture of right shoulder, not specified as traumatic: Secondary | ICD-10-CM | POA: Diagnosis present

## 2015-09-29 DIAGNOSIS — Z96653 Presence of artificial knee joint, bilateral: Secondary | ICD-10-CM | POA: Diagnosis present

## 2015-09-29 DIAGNOSIS — Z881 Allergy status to other antibiotic agents status: Secondary | ICD-10-CM | POA: Diagnosis not present

## 2015-09-29 DIAGNOSIS — I5032 Chronic diastolic (congestive) heart failure: Secondary | ICD-10-CM | POA: Diagnosis present

## 2015-09-29 DIAGNOSIS — Z96611 Presence of right artificial shoulder joint: Secondary | ICD-10-CM | POA: Diagnosis not present

## 2015-09-29 DIAGNOSIS — Z7982 Long term (current) use of aspirin: Secondary | ICD-10-CM | POA: Diagnosis not present

## 2015-09-29 DIAGNOSIS — I110 Hypertensive heart disease with heart failure: Secondary | ICD-10-CM | POA: Diagnosis not present

## 2015-09-29 DIAGNOSIS — E782 Mixed hyperlipidemia: Secondary | ICD-10-CM | POA: Diagnosis present

## 2015-09-29 DIAGNOSIS — K219 Gastro-esophageal reflux disease without esophagitis: Secondary | ICD-10-CM | POA: Diagnosis present

## 2015-09-29 DIAGNOSIS — G4733 Obstructive sleep apnea (adult) (pediatric): Secondary | ICD-10-CM | POA: Diagnosis not present

## 2015-09-29 DIAGNOSIS — Z471 Aftercare following joint replacement surgery: Secondary | ICD-10-CM | POA: Diagnosis not present

## 2015-09-29 DIAGNOSIS — Z96619 Presence of unspecified artificial shoulder joint: Secondary | ICD-10-CM

## 2015-09-29 DIAGNOSIS — M19011 Primary osteoarthritis, right shoulder: Secondary | ICD-10-CM | POA: Diagnosis not present

## 2015-09-29 DIAGNOSIS — Z794 Long term (current) use of insulin: Secondary | ICD-10-CM

## 2015-09-29 DIAGNOSIS — E1142 Type 2 diabetes mellitus with diabetic polyneuropathy: Secondary | ICD-10-CM | POA: Diagnosis not present

## 2015-09-29 DIAGNOSIS — Z888 Allergy status to other drugs, medicaments and biological substances status: Secondary | ICD-10-CM

## 2015-09-29 DIAGNOSIS — M75121 Complete rotator cuff tear or rupture of right shoulder, not specified as traumatic: Secondary | ICD-10-CM | POA: Diagnosis not present

## 2015-09-29 DIAGNOSIS — G8918 Other acute postprocedural pain: Secondary | ICD-10-CM | POA: Diagnosis not present

## 2015-09-29 DIAGNOSIS — M545 Low back pain: Secondary | ICD-10-CM | POA: Diagnosis not present

## 2015-09-29 HISTORY — PX: REVERSE SHOULDER ARTHROPLASTY: SHX5054

## 2015-09-29 LAB — GLUCOSE, CAPILLARY
GLUCOSE-CAPILLARY: 193 mg/dL — AB (ref 65–99)
GLUCOSE-CAPILLARY: 188 mg/dL — AB (ref 65–99)
Glucose-Capillary: 181 mg/dL — ABNORMAL HIGH (ref 65–99)
Glucose-Capillary: 185 mg/dL — ABNORMAL HIGH (ref 65–99)

## 2015-09-29 SURGERY — ARTHROPLASTY, SHOULDER, TOTAL, REVERSE
Anesthesia: Regional | Site: Shoulder | Laterality: Right

## 2015-09-29 MED ORDER — METHOCARBAMOL 500 MG PO TABS: 500.0000 mg | ORAL_TABLET | Freq: Three times a day (TID) | ORAL | 1 refills | Status: DC | PRN

## 2015-09-29 MED ORDER — INSULIN ASPART 100 UNIT/ML ~~LOC~~ SOLN
0.0000 [IU] | Freq: Three times a day (TID) | SUBCUTANEOUS | Status: DC
  Administered 2015-09-29: 4 [IU] via SUBCUTANEOUS
  Administered 2015-09-30 – 2015-10-01 (×3): 3 [IU] via SUBCUTANEOUS
  Administered 2015-10-01: 4 [IU] via SUBCUTANEOUS

## 2015-09-29 MED ORDER — ASPIRIN 81 MG PO CHEW
81.0000 mg | CHEWABLE_TABLET | Freq: Every day | ORAL | Status: DC
  Administered 2015-09-30 – 2015-10-01 (×2): 81 mg via ORAL
  Filled 2015-09-29 (×2): qty 1

## 2015-09-29 MED ORDER — LEVOTHYROXINE SODIUM 50 MCG PO TABS
50.0000 ug | ORAL_TABLET | Freq: Every day | ORAL | Status: DC
  Administered 2015-10-01: 50 ug via ORAL
  Filled 2015-09-29 (×2): qty 1

## 2015-09-29 MED ORDER — GABAPENTIN 300 MG PO CAPS
300.0000 mg | ORAL_CAPSULE | Freq: Every day | ORAL | Status: DC
  Administered 2015-09-29 – 2015-09-30 (×2): 300 mg via ORAL
  Filled 2015-09-29 (×2): qty 1

## 2015-09-29 MED ORDER — MIDAZOLAM HCL 2 MG/2ML IJ SOLN
INTRAMUSCULAR | Status: AC
  Filled 2015-09-29: qty 2

## 2015-09-29 MED ORDER — METFORMIN HCL 500 MG PO TABS
1000.0000 mg | ORAL_TABLET | Freq: Two times a day (BID) | ORAL | Status: DC
  Administered 2015-09-29 – 2015-10-01 (×4): 1000 mg via ORAL
  Filled 2015-09-29 (×4): qty 2

## 2015-09-29 MED ORDER — CHLORHEXIDINE GLUCONATE CLOTH 2 % EX PADS
6.0000 | MEDICATED_PAD | Freq: Every day | CUTANEOUS | Status: DC
  Administered 2015-09-30 – 2015-10-01 (×2): 6 via TOPICAL

## 2015-09-29 MED ORDER — EPHEDRINE SULFATE-NACL 50-0.9 MG/10ML-% IV SOSY
PREFILLED_SYRINGE | INTRAVENOUS | Status: DC | PRN
  Administered 2015-09-29: 7.5 mg via INTRAVENOUS

## 2015-09-29 MED ORDER — LIDOCAINE HCL (CARDIAC) 20 MG/ML IV SOLN
INTRAVENOUS | Status: DC | PRN
  Administered 2015-09-29: 100 mg via INTRAVENOUS

## 2015-09-29 MED ORDER — DEXTROSE 5 % IV SOLN
INTRAVENOUS | Status: DC | PRN
  Administered 2015-09-29: 25 ug/min via INTRAVENOUS

## 2015-09-29 MED ORDER — CHLORHEXIDINE GLUCONATE 4 % EX LIQD: 60.0000 mL | Freq: Once | CUTANEOUS | Status: DC

## 2015-09-29 MED ORDER — MUPIROCIN 2 % EX OINT
1.0000 "application " | TOPICAL_OINTMENT | Freq: Two times a day (BID) | CUTANEOUS | Status: DC
  Administered 2015-09-30 – 2015-10-01 (×2): 1 via NASAL
  Filled 2015-09-29 (×2): qty 22

## 2015-09-29 MED ORDER — GLIMEPIRIDE 4 MG PO TABS
4.0000 mg | ORAL_TABLET | Freq: Two times a day (BID) | ORAL | Status: DC
  Administered 2015-09-29 – 2015-10-01 (×4): 4 mg via ORAL
  Filled 2015-09-29 (×4): qty 1

## 2015-09-29 MED ORDER — ROCURONIUM BROMIDE 100 MG/10ML IV SOLN
INTRAVENOUS | Status: DC | PRN
  Administered 2015-09-29: 30 mg via INTRAVENOUS

## 2015-09-29 MED ORDER — MORPHINE SULFATE (PF) 2 MG/ML IV SOLN
2.0000 mg | INTRAVENOUS | Status: DC | PRN
  Administered 2015-10-01 (×2): 2 mg via INTRAVENOUS
  Filled 2015-09-29 (×2): qty 1

## 2015-09-29 MED ORDER — ONDANSETRON HCL 4 MG/2ML IJ SOLN: 4.0000 mg | Freq: Four times a day (QID) | INTRAMUSCULAR | Status: DC | PRN

## 2015-09-29 MED ORDER — LACTATED RINGERS IV SOLN
INTRAVENOUS | Status: DC
  Administered 2015-09-29 (×2): via INTRAVENOUS

## 2015-09-29 MED ORDER — GLYCOPYRROLATE 0.2 MG/ML IJ SOLN
INTRAMUSCULAR | Status: DC | PRN
  Administered 2015-09-29: .4 mg via INTRAVENOUS

## 2015-09-29 MED ORDER — CEFAZOLIN SODIUM-DEXTROSE 2-4 GM/100ML-% IV SOLN
2.0000 g | Freq: Four times a day (QID) | INTRAVENOUS | Status: AC
  Administered 2015-09-29 – 2015-09-30 (×3): 2 g via INTRAVENOUS
  Filled 2015-09-29 (×3): qty 100

## 2015-09-29 MED ORDER — DOCUSATE SODIUM 100 MG PO CAPS
100.0000 mg | ORAL_CAPSULE | Freq: Two times a day (BID) | ORAL | Status: DC
  Administered 2015-09-29 – 2015-10-01 (×4): 100 mg via ORAL
  Filled 2015-09-29 (×4): qty 1

## 2015-09-29 MED ORDER — PROPOFOL 10 MG/ML IV BOLUS
INTRAVENOUS | Status: DC | PRN
  Administered 2015-09-29: 100 mg via INTRAVENOUS

## 2015-09-29 MED ORDER — PROPOFOL 10 MG/ML IV BOLUS
INTRAVENOUS | Status: AC
  Filled 2015-09-29: qty 20

## 2015-09-29 MED ORDER — INSULIN ASPART 100 UNIT/ML ~~LOC~~ SOLN
6.0000 [IU] | Freq: Three times a day (TID) | SUBCUTANEOUS | Status: DC
  Administered 2015-09-29 – 2015-10-01 (×6): 6 [IU] via SUBCUTANEOUS

## 2015-09-29 MED ORDER — FENTANYL CITRATE (PF) 250 MCG/5ML IJ SOLN
INTRAMUSCULAR | Status: AC
  Filled 2015-09-29: qty 5

## 2015-09-29 MED ORDER — INSULIN ASPART 100 UNIT/ML ~~LOC~~ SOLN: 0.0000 [IU] | Freq: Every day | SUBCUTANEOUS | Status: DC

## 2015-09-29 MED ORDER — DULOXETINE HCL 60 MG PO CPEP
60.0000 mg | ORAL_CAPSULE | Freq: Every day | ORAL | Status: DC
  Administered 2015-09-29 – 2015-09-30 (×2): 60 mg via ORAL
  Filled 2015-09-29 (×3): qty 1

## 2015-09-29 MED ORDER — METHOCARBAMOL 500 MG PO TABS
500.0000 mg | ORAL_TABLET | Freq: Four times a day (QID) | ORAL | Status: DC | PRN
  Administered 2015-09-29 – 2015-10-01 (×3): 500 mg via ORAL
  Filled 2015-09-29 (×4): qty 1

## 2015-09-29 MED ORDER — FENTANYL CITRATE (PF) 100 MCG/2ML IJ SOLN
INTRAMUSCULAR | Status: AC
  Administered 2015-09-29 (×2): 25 ug
  Filled 2015-09-29: qty 2

## 2015-09-29 MED ORDER — HYDROCHLOROTHIAZIDE 12.5 MG PO CAPS
12.5000 mg | ORAL_CAPSULE | Freq: Every day | ORAL | Status: DC
  Administered 2015-09-30 – 2015-10-01 (×2): 12.5 mg via ORAL
  Filled 2015-09-29 (×2): qty 1

## 2015-09-29 MED ORDER — MENTHOL 3 MG MT LOZG: 1.0000 | LOZENGE | OROMUCOSAL | Status: DC | PRN

## 2015-09-29 MED ORDER — HYDROCODONE-ACETAMINOPHEN 5-325 MG PO TABS
1.0000 | ORAL_TABLET | ORAL | Status: DC | PRN
  Administered 2015-09-29: 2 via ORAL
  Administered 2015-09-29: 1 via ORAL
  Filled 2015-09-29: qty 1
  Filled 2015-09-29 (×2): qty 2

## 2015-09-29 MED ORDER — SODIUM CHLORIDE 0.9 % IV SOLN
INTRAVENOUS | Status: DC
  Administered 2015-09-29: 18:00:00 via INTRAVENOUS

## 2015-09-29 MED ORDER — 0.9 % SODIUM CHLORIDE (POUR BTL) OPTIME
TOPICAL | Status: DC | PRN
  Administered 2015-09-29: 1000 mL

## 2015-09-29 MED ORDER — HYDROCODONE-ACETAMINOPHEN 5-325 MG PO TABS: 1.0000 | ORAL_TABLET | Freq: Four times a day (QID) | ORAL | 0 refills | Status: DC | PRN

## 2015-09-29 MED ORDER — PRAVASTATIN SODIUM 40 MG PO TABS
40.0000 mg | ORAL_TABLET | Freq: Every day | ORAL | Status: DC
  Administered 2015-09-29 – 2015-09-30 (×2): 40 mg via ORAL
  Filled 2015-09-29 (×2): qty 1

## 2015-09-29 MED ORDER — METHOCARBAMOL 1000 MG/10ML IJ SOLN
500.0000 mg | Freq: Four times a day (QID) | INTRAVENOUS | Status: DC | PRN
  Filled 2015-09-29: qty 5

## 2015-09-29 MED ORDER — BUPIVACAINE-EPINEPHRINE (PF) 0.25% -1:200000 IJ SOLN
INTRAMUSCULAR | Status: AC
  Filled 2015-09-29: qty 30

## 2015-09-29 MED ORDER — BUPIVACAINE-EPINEPHRINE (PF) 0.5% -1:200000 IJ SOLN
INTRAMUSCULAR | Status: DC | PRN
  Administered 2015-09-29: 30 mL via PERINEURAL

## 2015-09-29 MED ORDER — PROMETHAZINE HCL 25 MG/ML IJ SOLN: 6.2500 mg | INTRAMUSCULAR | Status: DC | PRN

## 2015-09-29 MED ORDER — ONDANSETRON HCL 4 MG PO TABS: 4.0000 mg | ORAL_TABLET | Freq: Four times a day (QID) | ORAL | Status: DC | PRN

## 2015-09-29 MED ORDER — BISOPROLOL FUMARATE 10 MG PO TABS
5.0000 mg | ORAL_TABLET | Freq: Every day | ORAL | Status: DC
  Administered 2015-09-30 – 2015-10-01 (×2): 5 mg via ORAL
  Filled 2015-09-29 (×2): qty 1

## 2015-09-29 MED ORDER — FENTANYL CITRATE (PF) 100 MCG/2ML IJ SOLN
INTRAMUSCULAR | Status: DC | PRN
  Administered 2015-09-29: 100 ug via INTRAVENOUS

## 2015-09-29 MED ORDER — SUGAMMADEX SODIUM 200 MG/2ML IV SOLN
INTRAVENOUS | Status: DC | PRN
  Administered 2015-09-29: 200 mg via INTRAVENOUS

## 2015-09-29 MED ORDER — PHENOL 1.4 % MT LIQD
1.0000 | OROMUCOSAL | Status: DC | PRN
Start: 2015-09-29 — End: 2015-10-01

## 2015-09-29 MED ORDER — BUPIVACAINE-EPINEPHRINE 0.25% -1:200000 IJ SOLN
INTRAMUSCULAR | Status: DC | PRN
  Administered 2015-09-29: 8 mL

## 2015-09-29 MED ORDER — METOCLOPRAMIDE HCL 5 MG/ML IJ SOLN: 5.0000 mg | Freq: Three times a day (TID) | INTRAMUSCULAR | Status: DC | PRN

## 2015-09-29 MED ORDER — INSULIN GLARGINE 100 UNIT/ML ~~LOC~~ SOLN
60.0000 [IU] | Freq: Every day | SUBCUTANEOUS | Status: DC
  Administered 2015-09-29 – 2015-09-30 (×2): 60 [IU] via SUBCUTANEOUS
  Filled 2015-09-29 (×3): qty 0.6

## 2015-09-29 MED ORDER — METOCLOPRAMIDE HCL 5 MG PO TABS: 5.0000 mg | ORAL_TABLET | Freq: Three times a day (TID) | ORAL | Status: DC | PRN

## 2015-09-29 MED ORDER — INSULIN GLARGINE 100 UNIT/ML SOLOSTAR PEN: 60.0000 [IU] | PEN_INJECTOR | Freq: Every day | SUBCUTANEOUS | Status: DC

## 2015-09-29 MED ORDER — ONDANSETRON HCL 4 MG/2ML IJ SOLN
INTRAMUSCULAR | Status: AC
  Filled 2015-09-29: qty 2

## 2015-09-29 MED ORDER — POLYETHYLENE GLYCOL 3350 17 G PO PACK: 17.0000 g | PACK | Freq: Every day | ORAL | Status: DC | PRN

## 2015-09-29 MED ORDER — FENTANYL CITRATE (PF) 100 MCG/2ML IJ SOLN: 25.0000 ug | INTRAMUSCULAR | Status: DC | PRN

## 2015-09-29 MED ORDER — ACETAMINOPHEN 650 MG RE SUPP: 650.0000 mg | Freq: Four times a day (QID) | RECTAL | Status: DC | PRN

## 2015-09-29 MED ORDER — EPHEDRINE 5 MG/ML INJ
INTRAVENOUS | Status: AC
  Filled 2015-09-29: qty 10

## 2015-09-29 MED ORDER — ACETAMINOPHEN 325 MG PO TABS
650.0000 mg | ORAL_TABLET | Freq: Four times a day (QID) | ORAL | Status: DC | PRN
  Administered 2015-09-30: 325 mg via ORAL
  Filled 2015-09-29: qty 2

## 2015-09-29 SURGICAL SUPPLY — 73 items
BIT DRILL 170X2.5X (BIT) IMPLANT
BIT DRILL 5/64X5 DISP (BIT) ×3 IMPLANT
BIT DRL 170X2.5X (BIT) ×1
BLADE SAG 18X100X1.27 (BLADE) ×3 IMPLANT
BOWL SMART MIX CTS (DISPOSABLE) ×2 IMPLANT
CAPT SHLDR REVTOTAL 1 ×2 IMPLANT
CEMENT BONE DEPUY (Cement) ×2 IMPLANT
CLOSURE WOUND 1/2 X4 (GAUZE/BANDAGES/DRESSINGS) ×1
COVER SURGICAL LIGHT HANDLE (MISCELLANEOUS) ×3 IMPLANT
DRAPE IMP U-DRAPE 54X76 (DRAPES) ×6 IMPLANT
DRAPE INCISE IOBAN 66X45 STRL (DRAPES) ×3 IMPLANT
DRAPE ORTHO SPLIT 77X108 STRL (DRAPES) ×6
DRAPE SURG ORHT 6 SPLT 77X108 (DRAPES) ×2 IMPLANT
DRAPE U-SHAPE 47X51 STRL (DRAPES) ×3 IMPLANT
DRAPE X-RAY CASS 24X20 (DRAPES) IMPLANT
DRILL 2.5 (BIT) ×3
DRSG ADAPTIC 3X8 NADH LF (GAUZE/BANDAGES/DRESSINGS) ×3 IMPLANT
DRSG PAD ABDOMINAL 8X10 ST (GAUZE/BANDAGES/DRESSINGS) ×3 IMPLANT
DURAPREP 26ML APPLICATOR (WOUND CARE) ×3 IMPLANT
ELECT BLADE 4.0 EZ CLEAN MEGAD (MISCELLANEOUS) ×3
ELECT NDL TIP 2.8 STRL (NEEDLE) ×1 IMPLANT
ELECT NEEDLE TIP 2.8 STRL (NEEDLE) ×3 IMPLANT
ELECT REM PT RETURN 9FT ADLT (ELECTROSURGICAL) ×3
ELECTRODE BLDE 4.0 EZ CLN MEGD (MISCELLANEOUS) ×1 IMPLANT
ELECTRODE REM PT RTRN 9FT ADLT (ELECTROSURGICAL) ×1 IMPLANT
GAUZE SPONGE 4X4 12PLY STRL (GAUZE/BANDAGES/DRESSINGS) ×3 IMPLANT
GLOVE BIOGEL PI ORTHO PRO 7.5 (GLOVE) ×2
GLOVE BIOGEL PI ORTHO PRO SZ8 (GLOVE) ×2
GLOVE ORTHO TXT STRL SZ7.5 (GLOVE) ×3 IMPLANT
GLOVE PI ORTHO PRO STRL 7.5 (GLOVE) ×1 IMPLANT
GLOVE PI ORTHO PRO STRL SZ8 (GLOVE) ×1 IMPLANT
GLOVE SURG ORTHO 8.5 STRL (GLOVE) ×3 IMPLANT
GOWN STRL REUS W/ TWL LRG LVL3 (GOWN DISPOSABLE) ×1 IMPLANT
GOWN STRL REUS W/ TWL XL LVL3 (GOWN DISPOSABLE) ×2 IMPLANT
GOWN STRL REUS W/TWL LRG LVL3 (GOWN DISPOSABLE) ×3
GOWN STRL REUS W/TWL XL LVL3 (GOWN DISPOSABLE) ×6
HANDPIECE INTERPULSE COAX TIP (DISPOSABLE)
KIT BASIN OR (CUSTOM PROCEDURE TRAY) ×3 IMPLANT
KIT ROOM TURNOVER OR (KITS) ×3 IMPLANT
MANIFOLD NEPTUNE II (INSTRUMENTS) ×3 IMPLANT
NDL 1/2 CIR MAYO (NEEDLE) ×1 IMPLANT
NDL HYPO 25GX1X1/2 BEV (NEEDLE) ×1 IMPLANT
NEEDLE 1/2 CIR MAYO (NEEDLE) IMPLANT
NEEDLE HYPO 25GX1X1/2 BEV (NEEDLE) ×3 IMPLANT
NS IRRIG 1000ML POUR BTL (IV SOLUTION) ×3 IMPLANT
PACK SHOULDER (CUSTOM PROCEDURE TRAY) ×3 IMPLANT
PAD ARMBOARD 7.5X6 YLW CONV (MISCELLANEOUS) ×6 IMPLANT
PIN METAGLENE 2.5 (PIN) ×4 IMPLANT
SET HNDPC FAN SPRY TIP SCT (DISPOSABLE) IMPLANT
SLING ARM IMMOBILIZER LRG (SOFTGOODS) ×2 IMPLANT
SLING ARM LRG ADULT FOAM STRAP (SOFTGOODS) ×2 IMPLANT
SLING ARM MED ADULT FOAM STRAP (SOFTGOODS) IMPLANT
SPONGE LAP 18X18 X RAY DECT (DISPOSABLE) IMPLANT
SPONGE LAP 4X18 X RAY DECT (DISPOSABLE) ×3 IMPLANT
STRIP CLOSURE SKIN 1/2X4 (GAUZE/BANDAGES/DRESSINGS) ×2 IMPLANT
SUCTION FRAZIER HANDLE 10FR (MISCELLANEOUS) ×2
SUCTION TUBE FRAZIER 10FR DISP (MISCELLANEOUS) ×1 IMPLANT
SUT FIBERWIRE #2 38 T-5 BLUE (SUTURE) ×6
SUT MNCRL AB 4-0 PS2 18 (SUTURE) ×3 IMPLANT
SUT VIC AB 0 CT1 27 (SUTURE) ×3
SUT VIC AB 0 CT1 27XBRD ANBCTR (SUTURE) IMPLANT
SUT VIC AB 0 CT2 27 (SUTURE) ×2 IMPLANT
SUT VIC AB 2-0 CT1 27 (SUTURE) ×3
SUT VIC AB 2-0 CT1 TAPERPNT 27 (SUTURE) ×1 IMPLANT
SUT VICRYL 0 CT 1 36IN (SUTURE) ×1 IMPLANT
SUTURE FIBERWR #2 38 T-5 BLUE (SUTURE) ×2 IMPLANT
SYR CONTROL 10ML LL (SYRINGE) ×3 IMPLANT
TOWEL OR 17X24 6PK STRL BLUE (TOWEL DISPOSABLE) ×3 IMPLANT
TOWEL OR 17X26 10 PK STRL BLUE (TOWEL DISPOSABLE) ×3 IMPLANT
TOWER CARTRIDGE SMART MIX (DISPOSABLE) IMPLANT
TRAY FOLEY CATH 16FRSI W/METER (SET/KITS/TRAYS/PACK) IMPLANT
WATER STERILE IRR 1000ML POUR (IV SOLUTION) ×3 IMPLANT
YANKAUER SUCT BULB TIP NO VENT (SUCTIONS) ×3 IMPLANT

## 2015-09-29 NOTE — Brief Op Note (Signed)
09/29/2015  12:46 PM  PATIENT:  Melissa Zhang  75 y.o. female  PRE-OPERATIVE DIAGNOSIS:  RIGHT ROTATOR CUFF ARTHROPATHY   POST-OPERATIVE DIAGNOSIS:  RIGHT ROTATOR CUFF ARTHROPATHY   PROCEDURE:  Procedure(s): RIGHT REVERSE TOTAL SHOULDER ARTHROPLASTY (Right) DePuy Delta Extend  SURGEON:  Surgeon(s) and Role:    * Netta Cedars, MD - Primary  PHYSICIAN ASSISTANT:   ASSISTANTS: Ventura Bruns, PA-C   ANESTHESIA:   regional and general  EBL:  Total I/O In: 1000 [I.V.:1000] Out: 200 [Blood:200]  BLOOD ADMINISTERED:none  DRAINS: none   LOCAL MEDICATIONS USED:  MARCAINE     SPECIMEN:  No Specimen  DISPOSITION OF SPECIMEN:  N/A  COUNTS:  YES  TOURNIQUET:  * No tourniquets in log *  DICTATION: .Other Dictation: Dictation Number [id number]  PLAN OF CARE: Admit to inpatient   PATIENT DISPOSITION:  PACU - hemodynamically stable.   Delay start of Pharmacological VTE agent (>24hrs) due to surgical blood loss or risk of bleeding: not applicable

## 2015-09-29 NOTE — Anesthesia Procedure Notes (Signed)
Procedure Name: Intubation Date/Time: 09/29/2015 10:41 AM Performed by: Kyung Rudd Pre-anesthesia Checklist: Patient identified, Emergency Drugs available, Suction available, Patient being monitored and Timeout performed Patient Re-evaluated:Patient Re-evaluated prior to inductionOxygen Delivery Method: Circle system utilized Preoxygenation: Pre-oxygenation with 100% oxygen Intubation Type: IV induction Ventilation: Mask ventilation without difficulty and Oral airway inserted - appropriate to patient size Laryngoscope Size: Mac and 4 Grade View: Grade I Tube type: Oral Tube size: 7.0 mm Number of attempts: 1 Airway Equipment and Method: Stylet Placement Confirmation: ETT inserted through vocal cords under direct vision,  positive ETCO2 and breath sounds checked- equal and bilateral Secured at: 21 cm Tube secured with: Tape Dental Injury: Teeth and Oropharynx as per pre-operative assessment

## 2015-09-29 NOTE — Transfer of Care (Signed)
Immediate Anesthesia Transfer of Care Note  Patient: Carollee Herter  Procedure(s) Performed: Procedure(s): RIGHT REVERSE TOTAL SHOULDER ARTHROPLASTY (Right)  Patient Location: PACU  Anesthesia Type:GA combined with regional for post-op pain  Level of Consciousness: awake, alert  and oriented  Airway & Oxygen Therapy: Patient Spontanous Breathing and Patient connected to face mask oxygen  Post-op Assessment: Report given to RN, Post -op Vital signs reviewed and stable and Patient moving all extremities  Post vital signs: Reviewed and stable  Last Vitals:  Vitals:   09/29/15 0955 09/29/15 1239  BP: (!) 198/87   Pulse: 99   Resp: (!) 28   Temp:  36.9 C    Last Pain:  Vitals:   09/29/15 0820  TempSrc: Oral         Complications: No apparent anesthesia complications

## 2015-09-29 NOTE — Anesthesia Postprocedure Evaluation (Signed)
Anesthesia Post Note  Patient: [place]  Procedure(s) Performed: Procedure(s) (LRB): RIGHT REVERSE TOTAL SHOULDER ARTHROPLASTY (Right)  Patient location during evaluation: PACU Anesthesia Type: General Level of consciousness: awake and alert Pain management: pain level controlled Vital Signs Assessment: post-procedure vital signs reviewed and stable Respiratory status: spontaneous breathing, nonlabored ventilation, respiratory function stable and patient connected to nasal cannula oxygen Cardiovascular status: blood pressure returned to baseline and stable Postop Assessment: no signs of nausea or vomiting Anesthetic complications: no    Last Vitals:  Vitals:   09/29/15 1315 09/29/15 1328  BP: (!) 149/91   Pulse: 83 (!) 115  Resp: 18 20  Temp:  36.3 C    Last Pain:  Vitals:   09/29/15 1328  TempSrc:   PainSc: 0-No pain                 Nilda Simmer

## 2015-09-29 NOTE — Anesthesia Procedure Notes (Signed)
Anesthesia Regional Block:  Interscalene brachial plexus block  Pre-Anesthetic Checklist: ,, timeout performed, Correct Patient, Correct Site, Correct Laterality, Correct Procedure, Correct Position, site marked, Risks and benefits discussed,  Surgical consent,  Pre-op evaluation,  At surgeon's request and post-op pain management  Laterality: Right     Needles:  Injection technique: Single-shot  Needle Type: Stimiplex     Needle Length: 9cm 9 cm Needle Gauge: 21 and 21 G    Additional Needles:  Procedures: ultrasound guided (picture in chart) Interscalene brachial plexus block Narrative:  Start time: 09/29/2015 9:35 AM End time: 09/29/2015 9:40 AM Injection made incrementally with aspirations every 5 mL.  Performed by: Personally  Anesthesiologist: Nilda Simmer

## 2015-09-29 NOTE — Anesthesia Preprocedure Evaluation (Addendum)
Anesthesia Evaluation  Patient identified by MRN, date of birth, ID band Patient awake    Reviewed: Allergy & Precautions, NPO status , Patient's Chart, lab work & pertinent test results, reviewed documented beta blocker date and time   History of Anesthesia Complications Negative for: history of anesthetic complications  Airway Mallampati: III  TM Distance: >3 FB Neck ROM: Full    Dental  (+) Dental Advisory Given,    Pulmonary neg shortness of breath, sleep apnea , neg COPD, neg recent URI,    Pulmonary exam normal breath sounds clear to auscultation       Cardiovascular Exercise Tolerance: Poor hypertension, Pt. on medications and Pt. on home beta blockers +CHF (diastolic dysfunction)  (-) Past MI, (-) Cardiac Stents and (-) CABG + Valvular Problems/Murmurs MR  Rhythm:Regular Rate:Normal + Systolic murmurs TTE 0000000: Impressions: - Mild LVH with LVEF 50-55%. Grade 2 diastolic dysfunction with   increased LV filling pressure. Moderate left atrial enlargement.   Mitral leaflets are mildly calcified, particularly at the leaflet   tips with reduced leaflet excursion suggesting mild mitral   stenosis. There is moderate to severe eccentric mitral   regurgitation as outlined above. Moderate tricuspid regurgitation   with PASP elevated at 62 mmHg. PFO evident with left-to-right   shunting by color Doppler. RV function is grossly normal.   Neuro/Psych neg Seizures Peripheral neuropathy    GI/Hepatic Neg liver ROS, GERD  Controlled,  Endo/Other  diabetes, Type 2, Oral Hypoglycemic Agents, Insulin Dependent  Renal/GU negative Renal ROS     Musculoskeletal Low back pain   Abdominal (+) + obese,   Peds  Hematology negative hematology ROS (+)   Anesthesia Other Findings HLD, allergic rhinitis  Reproductive/Obstetrics                            Anesthesia Physical Anesthesia Plan  ASA:  III  Anesthesia Plan: General and Regional   Post-op Pain Management: GA combined w/ Regional for post-op pain   Induction: Intravenous  Airway Management Planned: Oral ETT and Video Laryngoscope Planned  Additional Equipment:   Intra-op Plan:   Post-operative Plan: Extubation in OR  Informed Consent: I have reviewed the patients History and Physical, chart, labs and discussed the procedure including the risks, benefits and alternatives for the proposed anesthesia with the patient or authorized representative who has indicated his/her understanding and acceptance.   Dental advisory given  Plan Discussed with:   Anesthesia Plan Comments:        Anesthesia Quick Evaluation

## 2015-09-29 NOTE — Op Note (Signed)
NAMELAWONNA, MINOTT NO.:  [id number]  MEDICAL RECORD NO.:  [id number]  LOCATION:  [facility]                         FACILITY:  [facility]  PHYSICIAN:  Doran Heater. Veverly Fells, M.D. DATE OF BIRTH:  1941-01-27  DATE OF PROCEDURE:  09/29/2015 DATE OF DISCHARGE:                              OPERATIVE REPORT   PREOPERATIVE DIAGNOSIS:  Right shoulder rotator cuff tear arthropathy.  POSTOPERATIVE DIAGNOSIS:  Right shoulder rotator cuff tear arthropathy.  PROCEDURE PERFORMED:  Right shoulder reverse total shoulder arthroplasty using DePuy Delta Xtend prosthesis.  ATTENDING SURGEON:  Doran Heater. Veverly Fells, M.D.  ASSISTANT:  Abbott Pao. Dixon, P.A.-C., who scrubbed the entire procedure and necessary for satisfactory completion of surgery.  ANESTHESIA:  General anesthesia was used plus interscalene block.  ESTIMATED BLOOD LOSS:  Less than 200 mL.  FLUID REPLACEMENT:  1200 mL crystalloid.  INSTRUMENT COUNTS:  Correct.  COMPLICATIONS:  There were no complications.  ANTIBIOTICS:  Perioperative antibiotics were given.  INDICATIONS:  The patient is a 75 year old female with worsening right shoulder pain secondary to rotator cuff tear arthropathy.  The patient has had progressive pain despite conservative management, now desires operative shoulder replacement to relieve pain and restore function and improve quality of life.  Informed consent obtained.  DESCRIPTION OF PROCEDURE:  After an adequate level of anesthesia was achieved, the patient was positioned in modified beach-chair position. Right shoulder was correctly identified and sterilely prepped and draped in the usual manner.  Time-out was called.  We used a standard deltopectoral incision started at the coracoid process extending down to the anterior humerus, dissection down through the subcutaneous tissues. Cephalic vein identified taken laterally with the deltoid, the pectoralis was taken medially, the conjoint tendon  identified and retracted medially.  Subscapularis was absent.  We tagged a little bit of capsule just to protect the axillary nerve.  We then released the capsule off the inferior humerus with external rotation.  We then delivered the humeral head out of the wound.  The biceps, subscap, and supra and infraspinatus were already torn.  We then entered the proximal humerus with a 6-mm reamer and reamed up to size 10, placed our head resection guide, which was intramedullary guide and set that on 10 degrees of retroversion.  We removed the head using oscillating saw.  We then removed the remaining osteophytes with rongeurs.  We then completed our humeral preparation with the metaphyseal reamer for the epi-1 right of metaphysis.  Next, we went ahead and subluxed the humerus posteriorly with the trial component in place and then did a 360-degree capsular removal and glenoid labrum removal.  We found the center point on the glenoid face, which was completely devoid of any cartilage, we placed a centering guidepin and then did our metaglene in preparation with the reamer very gently and then did our peripheral hand reamer and then used the cannulated drill to drill out the central peg hole.  We then impacted the metaglene into position, centering off the 6 o'clock position.  We placed 36 screws, locked inferior and superior and then a single 18 nonlocked posteriorly.  We did not have enough room and we certainly placed the screw there.  Once those screws were in place, with secure fixation of the metaglene, we then placed our 38 standard glenosphere into position and then screwed that onto the metaglene. With the glenosphere into position and verifying that the axillary nerve was free and clear, we then reduced the shoulder with a 38+ 3 trial, felt like we could probably get bigger than that.  We removed our trial component on the humeral side, then went ahead and obtained the real implants, the  10 body and epi-1 right metaphyseal component and these were both HA-coated press-fit stems.  We did decide to go ahead and augment with little cement, so hybrid technique in the humeral shaft, so we vacuum mixed the cement on the back table, we irrigated and dried out the canal and then we placed a little cement by hand down the canal and then impacted the HA-coated stem in the appropriate 10 degrees of retroversion and this was set on the 0 setting.  Once that was secured in place, we trialed with a +6 and felt like we could go just slightly little bit bigger as we got a little bit of gapping with external rotation.  We made sure there were no soft tissue impinging areas and went ahead and impacted the real 38 +9 poly onto the humeral side.  We reduced the shoulder with nice little pop, felt like that was appropriate tension and axillary nerve was not under too much tension and conjoined was tensioned nicely, but not over tension.  We thoroughly irrigated and then repaired the deltopectoral interval with 0 Vicryl suture followed by 2-0 Vicryl for subcutaneous closure and 4-0 Monocryl for skin.  Steri-Strips applied followed by sterile dressing.  The patient tolerated the surgery well.     Doran Heater. Veverly Fells, M.D.     SRN/MEDQ  D:  09/29/2015  T:  09/29/2015  Job:  [id number]

## 2015-09-29 NOTE — Discharge Instructions (Signed)
Ice to the shoulder as much as you can.  Keep pillow or blanket propped behind the right elbow to keep the arm positioned across your waist.  Do NOT push out of a chair with the right arm  Follow up with Dr Veverly Fells in two weeks. [phone]  Keep the incision clean and dry and covered for one week, then ok to shower

## 2015-09-29 NOTE — Interval H&P Note (Signed)
History and Physical Interval Note:  09/29/2015 9:58 AM  Melissa Zhang  has presented today for surgery, with the diagnosis of RIGHT ROTATOR CUFF ARTHROPATHY   The various methods of treatment have been discussed with the patient and family. After consideration of risks, benefits and other options for treatment, the patient has consented to  Procedure(s): RIGHT REVERSE TOTAL SHOULDER ARTHROPLASTY (Right) as a surgical intervention .  The patient's history has been reviewed, patient examined, no change in status, stable for surgery.  I have reviewed the patient's chart and labs.  Questions were answered to the patient's satisfaction.     Trei Schoch,STEVEN R

## 2015-09-30 LAB — BASIC METABOLIC PANEL
Anion gap: 9 (ref 5–15)
BUN: 11 mg/dL (ref 6–20)
CO2: 26 mmol/L (ref 22–32)
CREATININE: 1.03 mg/dL — AB (ref 0.44–1.00)
Calcium: 8.2 mg/dL — ABNORMAL LOW (ref 8.9–10.3)
Chloride: 98 mmol/L — ABNORMAL LOW (ref 101–111)
GFR calc Af Amer: >60 mL/min (ref >60)
GFR, EST NON AFRICAN AMERICAN: 53 mL/min — AB (ref >60)
GLUCOSE: 145 mg/dL — AB (ref 65–99)
POTASSIUM: 4.5 mmol/L (ref 3.5–5.1)
SODIUM: 133 mmol/L — AB (ref 135–145)

## 2015-09-30 LAB — GLUCOSE, CAPILLARY
GLUCOSE-CAPILLARY: 141 mg/dL — AB (ref 65–99)
GLUCOSE-CAPILLARY: 181 mg/dL — AB (ref 65–99)
Glucose-Capillary: 181 mg/dL — ABNORMAL HIGH (ref 65–99)
Glucose-Capillary: 186 mg/dL — ABNORMAL HIGH (ref 65–99)

## 2015-09-30 LAB — HEMOGLOBIN AND HEMATOCRIT, BLOOD
HEMATOCRIT: 34.4 % — AB (ref 36.0–46.0)
Hemoglobin: 10.7 g/dL — ABNORMAL LOW (ref 12.0–15.0)

## 2015-09-30 MED ORDER — TRAMADOL HCL 50 MG PO TABS
50.0000 mg | ORAL_TABLET | Freq: Four times a day (QID) | ORAL | Status: DC | PRN
  Administered 2015-09-30 – 2015-10-01 (×3): 100 mg via ORAL
  Filled 2015-09-30 (×3): qty 2

## 2015-09-30 NOTE — Progress Notes (Signed)
Orthopedics Progress Note  Subjective: Patient reports pain in the shoulder and concerns over going home, specifically that she will be alone and she has balance issues.  She also was unable to tolerate the hydrocodone  Objective:  Vitals:   09/30/15 0505 09/30/15 1310  BP: 121/66 (!) 122/46  Pulse: 76 78  Resp: 18 17  Temp: 98.4 F (36.9 C) 98.3 F (36.8 C)    General: Awake and alert  Musculoskeletal: right shoulder dressing CDI, NVI distally Neurovascularly intact  Lab Results  Component Value Date   WBC 8.3 09/19/2015   HGB 10.7 (L) 09/30/2015   HCT 34.4 (L) 09/30/2015   MCV 92.7 09/19/2015   PLT 276 09/19/2015       Component Value Date/Time   NA 133 (L) 09/30/2015 0647   K 4.5 09/30/2015 0647   CL 98 (L) 09/30/2015 0647   CO2 26 09/30/2015 0647   GLUCOSE 145 (H) 09/30/2015 0647   BUN 11 09/30/2015 0647   CREATININE 1.03 (H) 09/30/2015 0647   CALCIUM 8.2 (L) 09/30/2015 0647   GFRNONAA 53 (L) 09/30/2015 0647   GFRAA >60 09/30/2015 0647    No results found for: INR, PROTIME  Assessment/Plan: POD #1  s/p Procedure(s): RIGHT REVERSE TOTAL SHOULDER ARTHROPLASTY Patient to continue with hospitalization to control pain and will see if she can go home tomorrow with home health or if she will need SNF PT, OT Change pain medicine to Ultram  Remo Lipps R. Veverly Fells, MD 09/30/2015 1:44 PM

## 2015-09-30 NOTE — Evaluation (Signed)
Occupational Therapy Evaluation Patient Details Name: Melissa Zhang MRN: [id number] DOB: Apr 21, 1940 Today's Date: 09/30/2015    History of Present Illness s/p R reverse TSA   Clinical Impression   Pt was independent in ADL and mobility prior to admission. She used a bed rail due to hx of falling OOB. Pt reports 10/10 shoulder pain. Performed AROM of R elbow to hand. Instructed pt and husband in sling use and wearing schedule, positioning in bed and chair and compensatory strategies for ADL. Pt's husband plans to return to work as a Administrator on Monday and pt will be on her own. Pt does not have available assistance so needs to be functioning at a modified independent level upon discharge. Will follow acutely.    Follow Up Recommendations  No OT follow up;Supervision/Assistance - 24 hour (initially)    Equipment Recommendations  None recommended by OT    Recommendations for Other Services       Precautions / Restrictions Precautions Precautions: Shoulder Type of Shoulder Precautions: conservative protocol Shoulder Interventions: Shoulder sling/immobilizer;Off for dressing/bathing/exercises Precaution Booklet Issued: Yes (comment) Required Braces or Orthoses: Sling Restrictions Weight Bearing Restrictions: No Other Position/Activity Restrictions: no shoulder movement, AROM elbow to hand on R      Mobility Bed Mobility Overal bed mobility: Needs Assistance Bed Mobility: Supine to Sit     Supine to sit: Min guard     General bed mobility comments: increased time and use of bed rail, up to L side of bed simulating home  Transfers Overall transfer level: Needs assistance   Transfers: Sit to/from Stand Sit to Stand: Supervision              Balance                                            ADL Overall ADL's : Needs assistance/impaired Eating/Feeding: Set up;Sitting Eating/Feeding Details (indicate cue type and reason): assist to open  containers Grooming: Wash/dry hands;Standing;Supervision/safety   Upper Body Bathing: Moderate assistance;Sitting   Lower Body Bathing: Supervison/ safety;Sit to/from stand   Upper Body Dressing : Moderate assistance;Sitting   Lower Body Dressing: Supervision/safety;Sit to/from stand Lower Body Dressing Details (indicate cue type and reason): pt does not typically wear socks, plans to use slip on shoes Toilet Transfer: Supervision/safety;Ambulation   Toileting- Clothing Manipulation and Hygiene: Supervision/safety;Sit to/from stand       Functional mobility during ADLs: Supervision/safety General ADL Comments: Pt educated in NWB status of R UE, sling use and wearing schedule, positioning R UE in bed and chair and compensatory strategies for ADL. Reinforced with written handout.     Vision     Perception     Praxis      Pertinent Vitals/Pain Pain Assessment: 0-10 Pain Score: 10-Worst pain ever Pain Location: R shoulder Pain Descriptors / Indicators: Aching;Guarding;Grimacing Pain Intervention(s): Monitored during session;RN gave pain meds during session;Repositioned;Ice applied     Hand Dominance Right   Extremity/Trunk Assessment Upper Extremity Assessment Upper Extremity Assessment: RUE deficits/detail RUE Deficits / Details: performed AROM elbow, forearm, wrist, hand x 10 RUE: Unable to fully assess due to immobilization RUE Coordination: decreased gross motor   Lower Extremity Assessment Lower Extremity Assessment: Overall WFL for tasks assessed       Communication Communication Communication: No difficulties   Cognition Arousal/Alertness: Awake/alert Behavior During Therapy: WFL for tasks assessed/performed Overall Cognitive Status:  Within Functional Limits for tasks assessed                     General Comments       Exercises       Shoulder Instructions      Home Living Family/patient expects to be discharged to:: Private  residence Living Arrangements: Spouse/significant other Available Help at Discharge:  (husband is a Administrator, will return to work Monday)               Bathroom Shower/Tub: Chief Strategy Officer: Other (comment) (bed rail)          Prior Functioning/Environment Level of Independence: Independent             OT Diagnosis: Generalized weakness;Acute pain   OT Problem List: Decreased strength;Decreased range of motion;Decreased activity tolerance;Decreased coordination;Obesity;Impaired UE functional use;Pain   OT Treatment/Interventions: Self-care/ADL training;Therapeutic exercise;Therapeutic activities;Patient/family education    OT Goals(Current goals can be found in the care plan section) Acute Rehab OT Goals Patient Stated Goal: lessen pain OT Goal Formulation: With patient Time For Goal Achievement: 10/07/15 Potential to Achieve Goals: Good ADL Goals Pt Will Perform Grooming: with modified independence;standing Pt Will Perform Toileting - Clothing Manipulation and hygiene: with modified independence;sit to/from stand Pt/caregiver will Perform Home Exercise Program: Increased ROM;Right Upper extremity;Independently (R elbow, forearm, hand and wrist) Additional ADL Goal #1: Pt will don and doff sling independently. Additional ADL Goal #2: Pt will perform self care at a modified independent level adhering to shoulder precautions.  OT Frequency: Min 2X/week   Barriers to D/C: Decreased caregiver support          Co-evaluation              End of Session Nurse Communication: Precautions;Weight bearing status;Mobility status  Activity Tolerance: Patient limited by pain Patient left: in chair;with call bell/phone within reach;with family/visitor present   Time: 1002-1040 OT Time Calculation (min): 38 min Charges:  OT General Charges $OT Visit: 1 Procedure OT Evaluation $OT Eval Moderate Complexity: 1 Procedure OT Treatments $Self  Care/Home Management : 8-22 mins $Therapeutic Exercise: 8-22 mins G-Codes:    Malka So 09/30/2015, 10:50 AM  [phone]

## 2015-10-01 LAB — GLUCOSE, CAPILLARY
GLUCOSE-CAPILLARY: 167 mg/dL — AB (ref 65–99)
Glucose-Capillary: 148 mg/dL — ABNORMAL HIGH (ref 65–99)

## 2015-10-01 NOTE — Progress Notes (Addendum)
Occupational Therapy Treatment Patient Details Name: Melissa Zhang MRN: [id number] DOB: [birth year] Today's Date: 10/01/2015    History of present illness s/p R reverse TSA   OT comments  Pt. Up ambulating without assistance at beginning of OT session. Able to complete toileting, bed mobility and grooming tasks without physical assist.  C/o pain were a constant throughout session limiting pts. Ability to perform sling education and training along with indicated rom.  Continued to say "im in pain and im tired I just can't".  Aware of limited assistance at home.  Husband available until noon tomorrow then he leaves for [place] until tues. Night. Some mention of friends that can check in if needed.   D/c home today may be best to allow for pt. And spouse to get home and settle/plan for pts. needs prior to him leaving on Tuesday.  Discussed with OTR/L and she is in agreement with d/c home today for pt.   Follow Up Recommendations  No OT follow up;Supervision/Assistance - 24 hour    Equipment Recommendations  None recommended by OT    Recommendations for Other Services      Precautions / Restrictions Precautions Precautions: Shoulder Type of Shoulder Precautions: conservative protocol Shoulder Interventions: Shoulder sling/immobilizer;Off for dressing/bathing/exercises Required Braces or Orthoses: Sling Restrictions Other Position/Activity Restrictions: no shoulder movement, AROM elbow to hand on R       Mobility Bed Mobility Overal bed mobility: Modified Independent Bed Mobility: Sit to Supine;Sit to Sidelying;Rolling Rolling: Modified independent (Device/Increase time)     Sit to supine: Modified independent (Device/Increase time) Sit to sidelying: Modified independent (Device/Increase time) General bed mobility comments: hob flat, reports she uses left side to get in/out, able to complete bed mobility without assistance and states she has a rail on her bed at home to prevent  falling out of bed.  Transfers Overall transfer level: Modified independent Equipment used: None Transfers: Sit to/from [facility] Sit to Stand: Supervision Stand pivot transfers: Supervision            Balance                                   ADL Overall ADL's : Needs assistance/impaired     Grooming: Wash/dry hands;Standing;Cueing for compensatory techniques;Cueing for UE precautions;Supervision/safety Grooming Details (indicate cue type and reason): cues for no shoulder movement as pt. was trying to force R UE forward to wash hands, educated on taking a washcloth and washing the right hand on counter vs trying to move it forward into the sink area         Upper Body Dressing : Moderate assistance;Sitting Upper Body Dressing Details (indicate cue type and reason): attempted education and encouraged pt. to remove sling. she continued to refuse "im just tired and im wore out and i really dont care i just cant"      Toilet Transfer: Modified Engineer, manufacturing systems Details (indicate cue type and reason): pt. had ambulated to b.room without assistance and and was seated on the toilet upon arrival into room Toileting- Clothing Manipulation and Hygiene: Modified independent;Sitting/lateral lean       Functional mobility during ADLs: Supervision/safety General ADL Comments: reviewed ROM for RUE and encouraged removal of sling to perform elbow rom, pt. refusing and continued to repeat that no one understands her pain level and she just can not do anything that i am asking her to do.  husband present at end of session and states he is home until 12pm tomorrow. then back tuesday evening after a haul to [place]. states pt has friends that can assist intermittently but pt. kept shaking her head no that they were all "too busy to help" pt. states she is used to taking care of people and appears to be having a hard time receiving help from  others.  encouraged her to allow the help as needed.        Vision                     Perception     Praxis      Cognition   Behavior During Therapy: Agitated Overall Cognitive Status: Within Functional Limits for tasks assessed                       Extremity/Trunk Assessment               Exercises Other Exercises Other Exercises: attempted digit, wrist, and elbow ROM. pt. refusing with multiple attempts Donning/doffing sling/immobilizer: Moderate assistance Correct positioning of sling/immobilizer: Moderate assistance   Shoulder Instructions Shoulder Instructions Donning/doffing sling/immobilizer: Moderate assistance Correct positioning of sling/immobilizer: Moderate assistance     General Comments      Pertinent Vitals/ Pain       Pain Assessment: 0-10 Pain Score: 10-Worst pain ever Pain Location: R shoulder Pain Descriptors / Indicators: Aching;Guarding Pain Intervention(s): Limited activity within patient's tolerance;Monitored during session;Repositioned  Home Living                                          Prior Functioning/Environment              Frequency Min 2X/week     Progress Toward Goals  OT Goals(current goals can now be found in the care plan section)  Progress towards OT goals: Progressing toward goals     Plan Discharge plan remains appropriate    Co-evaluation                 End of Session Equipment Utilized During Treatment: Other (comment) (sling)   Activity Tolerance Patient limited by pain   Patient Left in bed;with call bell/phone within reach;with family/visitor present   Nurse Communication          Time: 1050-1120 OT Time Calculation (min): 30 min  Charges: OT General Charges $OT Visit: 1 Procedure OT Treatments $Self Care/Home Management : 8-22 mins  Janice Coffin, COTA/L 10/01/2015, 11:44 AM

## 2015-10-01 NOTE — Progress Notes (Signed)
Orthopedics Progress Note  Subjective: Patient states that she is still in a fair amount of pain and is unsure if she can go home  Objective:  Vitals:   09/30/15 2054 10/01/15 0521  BP: (!) 129/54 136/66  Pulse: 82 86  Resp: 18 18  Temp: 98.3 F (36.8 C) 98.4 F (36.9 C)    General: Awake and alert  Musculoskeletal: right shoulder dressing changed to gel bandage, incision looks good Neurovascularly intact  Lab Results  Component Value Date   WBC 8.3 09/19/2015   HGB 10.7 (L) 09/30/2015   HCT 34.4 (L) 09/30/2015   MCV 92.7 09/19/2015   PLT 276 09/19/2015       Component Value Date/Time   NA 133 (L) 09/30/2015 0647   K 4.5 09/30/2015 0647   CL 98 (L) 09/30/2015 0647   CO2 26 09/30/2015 0647   GLUCOSE 145 (H) 09/30/2015 0647   BUN 11 09/30/2015 0647   CREATININE 1.03 (H) 09/30/2015 0647   CALCIUM 8.2 (L) 09/30/2015 0647   GFRNONAA 53 (L) 09/30/2015 0647   GFRAA >60 09/30/2015 0647    No results found for: INR, PROTIME  Assessment/Plan: POD #2 s/p Procedure(s): RIGHT REVERSE TOTAL SHOULDER ARTHROPLASTY Possible D/C later today if OT clears her to go home.  She will need to be fairly independent as her husband is a long haul truck driver and seldom home.  Doran Heater. Veverly Fells, MD 10/01/2015 7:26 AM

## 2015-10-01 NOTE — Clinical Social Work Note (Signed)
CSW received notification from case management that pt cleared by MD for discharge home with home  CSW also informed by case management that pt would not have necessary care at home as her husband is a truck driver. CSW spoke with pt and explained that she could have option of having extra aid at home in addition to a social worker to check on pt at home. Pt and husband agreed to discharge home with home health. CSW spoke with case management and informed that pt would receive extensive aid at home with home health, and CNA at pt's request.

## 2015-10-01 NOTE — Care Management Note (Signed)
Case Management Note  Patient Details  Name: Melissa Zhang MRN: [id number] Date of Birth: 1940/03/12  Subjective/Objective:                  Active Problems:   S/P shoulder replacement  Action/Plan: Cm spoke to patient who was concerned about going home and wanted to know her options. Cm advised that PT and MD were recommending PT/OT and possibly an Aide. She said that she wanted to speak to SW about possible SNF placement as she does not feel that she will have 24/7 supervision. Her husband works out of town during the week. She has sons who live close by but have work during the day and would not be available to help outside of their work hours because of other commitments. CM explained to patient that with Medicare, SNF requires a 3 night inpatient stay and would likely have to pay out of pocket. CM called Takiyah with SW to advise of request to speak with them and will follow for discharge needs.   Expected Discharge Date:                  Expected Discharge Plan:     In-House Referral:  Clinical Social Work  Discharge planning Services  CM Consult  Post Acute Care Choice:    Choice offered to:     DME Arranged:    DME Agency:     HH Arranged:    HH Agency:     Status of Service:  In process, will continue to follow  If discussed at Long Length of Stay Meetings, dates discussed:    Additional Comments:  Guido Sander, RN 10/01/2015, 2:09 PM

## 2015-10-01 NOTE — Care Management Note (Signed)
Case Management Note  Patient Details  Name: Melissa Zhang MRN: [id number] Date of Birth: [birth year] Subjective/Objective:                  Active Problems:   S/P shoulder replacement  Action/Plan: Cm spoke to patient who was concerned about going home and wanted to know her options. Cm advised that PT and MD were recommending PT/OT and possibly an Aide. She said that she wanted to speak to SW about possible SNF placement as she does not feel that she will have 24/7 supervision. Her husband works out of town during the week. She has sons who live close by but have work during the day and is not sure if they would be available to help outside of their work hours because of other commitments. CM explained to patient that with Medicare, SNF requires a 3 night inpatient stay and would likely have to pay out of pocket. CM called Takiyah with SW to advise of request to speak with them.  After conversation with SW, patient does not qualify for inpatient SNF and does not want to private pay. She is now requesting to go home with [place]. CM offered patient choice for [facility] PT/OT/Aide/SW/ and RN and [facility]. Cm called tiffany with [facility] and referral was accepted.  Patient said that she would have her husband with her tonight and would be able to speak to her family or friends to see what could be arranged but feels comfortable now going home. CM remains available if additional needs arise.   Expected Discharge Date:  10/01/15           Expected Discharge Plan:  Home with [facility]   In-House Referral:  Clinical Social Work  Discharge planning Services  CM Consult  Post Acute Care Choice:  Home Health Choice offered to:  Patient  DME Arranged:    DME Agency:     HH Arranged:  RN, PT, OT, Nurse's Aide, Social Work [employer] Agency:  [place]  Status of Service:  Completed, signed off  If discussed at [employer], dates discussed:     Additional Comments:  Guido Sander, RN 10/01/2015, 2:34 PM

## 2015-10-01 NOTE — Discharge Summary (Signed)
Physician Discharge Summary   Patient ID: Melissa Zhang MRN: [id number] DOB/AGE: 09/27/1940 75 y.o.  Admit date: 09/29/2015 Discharge date: 10/01/2015  Admission Diagnoses:  Active Problems:   S/P shoulder replacement   Discharge Diagnoses:  Same   Surgeries: Procedure(s): RIGHT REVERSE TOTAL SHOULDER ARTHROPLASTY on 09/29/2015   Consultants: OT  Discharged Condition: Stable  Hospital Course: Melissa Zhang is an 75 y.o. female who was admitted 09/29/2015 with a chief complaint of right shoulder pain, and found to have a diagnosis of right shoulder arthritis.  They were brought to the operating room on 09/29/2015 and underwent the above named procedures.    The patient had an uncomplicated hospital course and was stable for discharge.  Recent vital signs:  Vitals:   09/30/15 2054 10/01/15 0521  BP: (!) 129/54 136/66  Pulse: 82 86  Resp: 18 18  Temp: 98.3 F (36.8 C) 98.4 F (36.9 C)    Recent laboratory studies:  Results for orders placed or performed during the hospital encounter of 09/29/15  Glucose, capillary  Result Value Ref Range   Glucose-Capillary 181 (H) 65 - 99 mg/dL  Glucose, capillary  Result Value Ref Range   Glucose-Capillary 185 (H) 65 - 99 mg/dL   Comment 1 Notify RN    Comment 2 Document in Chart   Hemoglobin and hematocrit, blood  Result Value Ref Range   Hemoglobin 10.7 (L) 12.0 - 15.0 g/dL   HCT 34.4 (L) 36.0 - [id number] %  Basic metabolic panel  Result Value Ref Range   Sodium 133 (L) 135 - 145 mmol/L   Potassium 4.5 3.5 - 5.1 mmol/L   Chloride 98 (L) 101 - 111 mmol/L   CO2 26 22 - 32 mmol/L   Glucose, Bld 145 (H) 65 - 99 mg/dL   BUN 11 6 - 20 mg/dL   Creatinine, Ser 1.03 (H) 0.44 - 1.00 mg/dL   Calcium 8.2 (L) 8.9 - 10.3 mg/dL   GFR calc non Af Amer 53 (L) >60 mL/min   GFR calc Af Amer >60 >60 mL/min   Anion gap 9 5 - 15  Glucose, capillary  Result Value Ref Range   Glucose-Capillary 193 (H) 65 - 99 mg/dL  Glucose, capillary    Result Value Ref Range   Glucose-Capillary 188 (H) 65 - 99 mg/dL  Glucose, capillary  Result Value Ref Range   Glucose-Capillary 141 (H) 65 - 99 mg/dL  Glucose, capillary  Result Value Ref Range   Glucose-Capillary 181 (H) 65 - 99 mg/dL  Glucose, capillary  Result Value Ref Range   Glucose-Capillary 186 (H) 65 - 99 mg/dL  Glucose, capillary  Result Value Ref Range   Glucose-Capillary 181 (H) 65 - 99 mg/dL  Glucose, capillary  Result Value Ref Range   Glucose-Capillary 148 (H) 65 - 99 mg/dL  Glucose, capillary  Result Value Ref Range   Glucose-Capillary 167 (H) 65 - 99 mg/dL   Comment 1 Repeat Test    Comment 2 Document in Chart     Discharge Medications:     Medication List    TAKE these medications   aspirin 81 MG tablet Take 81 mg by mouth daily.   bisoprolol 5 MG tablet Commonly known as:  ZEBETA Take 5 mg by mouth daily.   DULoxetine 60 MG capsule Commonly known as:  CYMBALTA Take 60 mg by mouth daily.   gabapentin 300 MG capsule Commonly known as:  NEURONTIN Take 300 mg by mouth at bedtime.   glimepiride 4  MG tablet Commonly known as:  AMARYL Take 4 mg by mouth 2 (two) times daily.   hydrochlorothiazide 12.5 MG capsule Commonly known as:  MICROZIDE Take 1 capsule (12.5 mg total) by mouth daily.   HYDROcodone-acetaminophen 5-325 MG tablet Commonly known as:  NORCO Take 1 tablet by mouth every 6 (six) hours as needed for moderate pain.   LANTUS SOLOSTAR 100 UNIT/ML Solostar Pen Generic drug:  Insulin Glargine Inject 60 Units into the skin at bedtime.   levothyroxine 50 MCG tablet Commonly known as:  SYNTHROID, LEVOTHROID Take 50 mcg by mouth daily.   lovastatin 40 MG tablet Commonly known as:  MEVACOR Take 40 mg by mouth at bedtime.   metFORMIN 1000 MG tablet Commonly known as:  GLUCOPHAGE Take 1,000 mg by mouth 2 (two) times daily.   methocarbamol 500 MG tablet Commonly known as:  ROBAXIN Take 1 tablet (500 mg total) by mouth 3 (three)  times daily as needed.       Diagnostic Studies: Dg Shoulder Right Port  Result Date: 09/29/2015 CLINICAL DATA:  Postop EXAM: PORTABLE RIGHT SHOULDER - 2+ VIEW COMPARISON:  None. FINDINGS: Single frontal view of the right shoulder submitted. There is right shoulder prosthesis with anatomic alignment. IMPRESSION: Right shoulder prosthesis with anatomic alignment. Electronically Signed   By: Lahoma Crocker M.D.   On: 09/29/2015 14:34    Disposition: home with home health PT, OT, and non skilled aide if indicated by therapy    Follow-up Information    Panfilo Ketchum,STEVEN R, MD. Call in 2 weeks.   Specialty:  Orthopedic Surgery Why:  [id number] Contact information: [address] [place] [address] [phone]            Signed: Augustin Schooling 10/01/2015, 12:32 PM

## 2015-10-02 ENCOUNTER — Encounter (HOSPITAL_COMMUNITY): Payer: Self-pay | Admitting: Orthopedic Surgery

## 2015-10-02 DIAGNOSIS — I10 Essential (primary) hypertension: Secondary | ICD-10-CM | POA: Diagnosis not present

## 2015-10-02 DIAGNOSIS — Z794 Long term (current) use of insulin: Secondary | ICD-10-CM | POA: Diagnosis not present

## 2015-10-02 DIAGNOSIS — K219 Gastro-esophageal reflux disease without esophagitis: Secondary | ICD-10-CM | POA: Diagnosis not present

## 2015-10-02 DIAGNOSIS — E1140 Type 2 diabetes mellitus with diabetic neuropathy, unspecified: Secondary | ICD-10-CM | POA: Diagnosis not present

## 2015-10-02 DIAGNOSIS — E782 Mixed hyperlipidemia: Secondary | ICD-10-CM | POA: Diagnosis not present

## 2015-10-02 DIAGNOSIS — Z471 Aftercare following joint replacement surgery: Secondary | ICD-10-CM | POA: Diagnosis not present

## 2015-10-02 DIAGNOSIS — G4733 Obstructive sleep apnea (adult) (pediatric): Secondary | ICD-10-CM | POA: Diagnosis not present

## 2015-10-02 DIAGNOSIS — Z96611 Presence of right artificial shoulder joint: Secondary | ICD-10-CM | POA: Diagnosis not present

## 2015-10-02 DIAGNOSIS — M545 Low back pain: Secondary | ICD-10-CM | POA: Diagnosis not present

## 2015-10-02 DIAGNOSIS — Z7984 Long term (current) use of oral hypoglycemic drugs: Secondary | ICD-10-CM | POA: Diagnosis not present

## 2015-10-02 DIAGNOSIS — Z96653 Presence of artificial knee joint, bilateral: Secondary | ICD-10-CM | POA: Diagnosis not present

## 2015-10-02 DIAGNOSIS — Z7982 Long term (current) use of aspirin: Secondary | ICD-10-CM | POA: Diagnosis not present

## 2015-10-03 DIAGNOSIS — K219 Gastro-esophageal reflux disease without esophagitis: Secondary | ICD-10-CM | POA: Diagnosis not present

## 2015-10-03 DIAGNOSIS — G4733 Obstructive sleep apnea (adult) (pediatric): Secondary | ICD-10-CM | POA: Diagnosis not present

## 2015-10-03 DIAGNOSIS — M545 Low back pain: Secondary | ICD-10-CM | POA: Diagnosis not present

## 2015-10-03 DIAGNOSIS — E1140 Type 2 diabetes mellitus with diabetic neuropathy, unspecified: Secondary | ICD-10-CM | POA: Diagnosis not present

## 2015-10-03 DIAGNOSIS — I10 Essential (primary) hypertension: Secondary | ICD-10-CM | POA: Diagnosis not present

## 2015-10-03 DIAGNOSIS — Z471 Aftercare following joint replacement surgery: Secondary | ICD-10-CM | POA: Diagnosis not present

## 2015-10-11 DIAGNOSIS — Z471 Aftercare following joint replacement surgery: Secondary | ICD-10-CM | POA: Diagnosis not present

## 2015-10-11 DIAGNOSIS — M75121 Complete rotator cuff tear or rupture of right shoulder, not specified as traumatic: Secondary | ICD-10-CM | POA: Diagnosis not present

## 2015-10-11 DIAGNOSIS — Z96611 Presence of right artificial shoulder joint: Secondary | ICD-10-CM | POA: Diagnosis not present

## 2015-11-14 DIAGNOSIS — Z471 Aftercare following joint replacement surgery: Secondary | ICD-10-CM | POA: Diagnosis not present

## 2015-11-14 DIAGNOSIS — Z96611 Presence of right artificial shoulder joint: Secondary | ICD-10-CM | POA: Diagnosis not present

## 2015-11-14 DIAGNOSIS — M75121 Complete rotator cuff tear or rupture of right shoulder, not specified as traumatic: Secondary | ICD-10-CM | POA: Diagnosis not present

## 2015-11-28 DIAGNOSIS — E1140 Type 2 diabetes mellitus with diabetic neuropathy, unspecified: Secondary | ICD-10-CM | POA: Diagnosis not present

## 2015-11-28 DIAGNOSIS — Z1211 Encounter for screening for malignant neoplasm of colon: Secondary | ICD-10-CM | POA: Diagnosis not present

## 2015-11-28 DIAGNOSIS — R202 Paresthesia of skin: Secondary | ICD-10-CM | POA: Diagnosis not present

## 2015-12-26 DIAGNOSIS — Z96611 Presence of right artificial shoulder joint: Secondary | ICD-10-CM | POA: Diagnosis not present

## 2015-12-26 DIAGNOSIS — Z471 Aftercare following joint replacement surgery: Secondary | ICD-10-CM | POA: Diagnosis not present

## 2016-01-01 DIAGNOSIS — M79642 Pain in left hand: Secondary | ICD-10-CM | POA: Diagnosis not present

## 2016-01-01 DIAGNOSIS — G5602 Carpal tunnel syndrome, left upper limb: Secondary | ICD-10-CM | POA: Diagnosis not present

## 2016-01-01 DIAGNOSIS — Z1211 Encounter for screening for malignant neoplasm of colon: Secondary | ICD-10-CM | POA: Diagnosis not present

## 2016-01-15 DIAGNOSIS — M79642 Pain in left hand: Secondary | ICD-10-CM | POA: Diagnosis not present

## 2016-01-31 DIAGNOSIS — G5602 Carpal tunnel syndrome, left upper limb: Secondary | ICD-10-CM | POA: Diagnosis not present

## 2016-03-08 ENCOUNTER — Other Ambulatory Visit (HOSPITAL_BASED_OUTPATIENT_CLINIC_OR_DEPARTMENT_OTHER): Payer: Self-pay | Admitting: Physician Assistant

## 2016-03-08 DIAGNOSIS — R109 Unspecified abdominal pain: Secondary | ICD-10-CM

## 2016-03-08 DIAGNOSIS — M549 Dorsalgia, unspecified: Secondary | ICD-10-CM

## 2016-03-13 ENCOUNTER — Ambulatory Visit (HOSPITAL_BASED_OUTPATIENT_CLINIC_OR_DEPARTMENT_OTHER)
Admission: RE | Admit: 2016-03-13 | Discharge: 2016-03-13 | Disposition: A | Discharge status: Home / Self Care | Payer: Medicare Other | Source: Ambulatory Visit | Attending: Physician Assistant | Admitting: Physician Assistant

## 2016-03-13 DIAGNOSIS — R109 Unspecified abdominal pain: Secondary | ICD-10-CM

## 2016-03-13 DIAGNOSIS — K5730 Diverticulosis of large intestine without perforation or abscess without bleeding: Secondary | ICD-10-CM | POA: Insufficient documentation

## 2016-03-13 DIAGNOSIS — M5136 Other intervertebral disc degeneration, lumbar region: Secondary | ICD-10-CM | POA: Insufficient documentation

## 2016-03-13 DIAGNOSIS — M549 Dorsalgia, unspecified: Secondary | ICD-10-CM | POA: Diagnosis not present

## 2016-03-13 DIAGNOSIS — M419 Scoliosis, unspecified: Secondary | ICD-10-CM | POA: Diagnosis not present

## 2016-03-13 DIAGNOSIS — R93421 Abnormal radiologic findings on diagnostic imaging of right kidney: Secondary | ICD-10-CM | POA: Insufficient documentation

## 2016-03-13 DIAGNOSIS — I700 Atherosclerosis of aorta: Secondary | ICD-10-CM | POA: Diagnosis not present

## 2016-03-29 DIAGNOSIS — G5602 Carpal tunnel syndrome, left upper limb: Secondary | ICD-10-CM | POA: Diagnosis not present

## 2016-04-05 DIAGNOSIS — G5602 Carpal tunnel syndrome, left upper limb: Secondary | ICD-10-CM | POA: Diagnosis not present

## 2016-04-05 DIAGNOSIS — Z4789 Encounter for other orthopedic aftercare: Secondary | ICD-10-CM | POA: Diagnosis not present

## 2016-04-12 DIAGNOSIS — G5602 Carpal tunnel syndrome, left upper limb: Secondary | ICD-10-CM | POA: Diagnosis not present

## 2016-04-16 ENCOUNTER — Telehealth: Payer: Self-pay | Admitting: Cardiovascular Disease

## 2016-04-16 NOTE — Telephone Encounter (Signed)
Numerous attempts to contact patient with recall letters. Unable to reach by telephone. with no success.   User: Time: StatusChanda Busing [id number] 10/21/2014 5:01 PM New [10]   [System] 07/20/2015 11:00 PM Notification Sent [20]   Weston Anna [id number] 02/08/2016 10:10 AM Notification Sent [20]   Chanda Busing [id number] 04/16/2016 3:09 PM Notification Sent [20]  Scheduling Instructions  1 yr

## 2016-04-22 DIAGNOSIS — E039 Hypothyroidism, unspecified: Secondary | ICD-10-CM | POA: Diagnosis not present

## 2016-04-22 DIAGNOSIS — E1140 Type 2 diabetes mellitus with diabetic neuropathy, unspecified: Secondary | ICD-10-CM | POA: Diagnosis not present

## 2016-04-22 DIAGNOSIS — E785 Hyperlipidemia, unspecified: Secondary | ICD-10-CM | POA: Diagnosis not present

## 2016-04-26 DIAGNOSIS — Z4789 Encounter for other orthopedic aftercare: Secondary | ICD-10-CM | POA: Diagnosis not present

## 2016-04-26 DIAGNOSIS — G5602 Carpal tunnel syndrome, left upper limb: Secondary | ICD-10-CM | POA: Diagnosis not present

## 2016-06-13 ENCOUNTER — Encounter: Payer: Self-pay | Admitting: Cardiovascular Disease

## 2016-06-13 ENCOUNTER — Ambulatory Visit (INDEPENDENT_AMBULATORY_CARE_PROVIDER_SITE_OTHER): Payer: Medicare Other | Admitting: Cardiovascular Disease

## 2016-06-13 VITALS — BP 138/78 | HR 70 | Ht 62.5 in | Wt 254.0 lb

## 2016-06-13 DIAGNOSIS — I340 Nonrheumatic mitral (valve) insufficiency: Secondary | ICD-10-CM

## 2016-06-13 DIAGNOSIS — I519 Heart disease, unspecified: Secondary | ICD-10-CM

## 2016-06-13 DIAGNOSIS — R0609 Other forms of dyspnea: Secondary | ICD-10-CM | POA: Diagnosis not present

## 2016-06-13 DIAGNOSIS — R600 Localized edema: Secondary | ICD-10-CM | POA: Diagnosis not present

## 2016-06-13 DIAGNOSIS — G4733 Obstructive sleep apnea (adult) (pediatric): Secondary | ICD-10-CM

## 2016-06-13 DIAGNOSIS — Q211 Atrial septal defect, unspecified: Secondary | ICD-10-CM

## 2016-06-13 DIAGNOSIS — I071 Rheumatic tricuspid insufficiency: Secondary | ICD-10-CM

## 2016-06-13 DIAGNOSIS — I38 Endocarditis, valve unspecified: Secondary | ICD-10-CM | POA: Diagnosis not present

## 2016-06-13 NOTE — Progress Notes (Signed)
SUBJECTIVE: The patient presents for past due follow-up for valvular heart disease and chronic exertional dyspnea in the setting of obesity and sleep apnea.  Echocardiogram 08/09/15:  Mild LVH with LVEF 50-55%. Grade 2 diastolic dysfunction with   increased LV filling pressure. Moderate left atrial enlargement.   Mitral leaflets are mildly calcified, particularly at the leaflet   tips with reduced leaflet excursion suggesting mild mitral   stenosis. There is moderate to severe eccentric mitral   regurgitation. Moderate tricuspid regurgitation   with PASP elevated at 62 mmHg. PFO evident with left-to-right   shunting by color Doppler. RV function is grossly normal.  ECG performed in the office today which I ordered and personally interpreted demonstrates normal sinus rhythm with a nonspecific intraventricular conduction delay.  Her chronic exertional dyspnea is stable. She denies chest pain, lightheadedness, dizziness, palpitations, and syncope. She has some mild leg edema controlled with diuretics.  She continues to refuse CPAP.  She had right shoulder replacement surgery in August 2018.   Review of Systems: As per "subjective", otherwise negative.  Allergies  Allergen Reactions  . Lisinopril Anaphylaxis and Swelling    TONGUE SWELLING  . Actos [Pioglitazone] Swelling    UNSPECIFIED   . Losartan Other (See Comments)    Made potassium levels go up CAUTION: SEVERE REACTION TO ACE-I ? ANAPHYLAXIS ?  Marland Kitchen Ciprofloxacin Nausea Only    Current Outpatient Prescriptions  Medication Sig Dispense Refill  . aspirin 81 MG tablet Take 81 mg by mouth daily.    . bisoprolol (ZEBETA) 5 MG tablet Take 5 mg by mouth daily.  3  . DULoxetine (CYMBALTA) 60 MG capsule Take 60 mg by mouth daily.    Marland Kitchen gabapentin (NEURONTIN) 300 MG capsule Take 300 mg by mouth at bedtime.    Marland Kitchen glimepiride (AMARYL) 4 MG tablet Take 4 mg by mouth 2 (two) times daily.    . hydrochlorothiazide (MICROZIDE) 12.5 MG  capsule Take 1 capsule (12.5 mg total) by mouth daily. 90 capsule 3  . LANTUS SOLOSTAR 100 UNIT/ML Solostar Pen Inject 60 Units into the skin at bedtime.     Marland Kitchen levothyroxine (SYNTHROID, LEVOTHROID) 50 MCG tablet Take 50 mcg by mouth daily.    Marland Kitchen lovastatin (MEVACOR) 40 MG tablet Take 40 mg by mouth at bedtime.    . metFORMIN (GLUCOPHAGE) 1000 MG tablet Take 1,000 mg by mouth 2 (two) times daily.     No current facility-administered medications for this visit.     Past Medical History:  Diagnosis Date  . Allergic rhinitis   . Esophageal reflux   . Hypertension   . Low back pain   . Mixed hyperlipidemia   . Obstructive sleep apnea   . Peripheral neuropathy   . Pneumonia    history  . Type 2 diabetes mellitus ([facility])     Past Surgical History:  Procedure Laterality Date  . ABDOMINAL HYSTERECTOMY    . CHOLECYSTECTOMY    . COLONOSCOPY    . CYSTECTOMY     LEFT BREAST  . EYE SURGERY Bilateral    caratacts  . REPLACEMENT TOTAL KNEE BILATERAL    . REVERSE SHOULDER ARTHROPLASTY Right 09/29/2015   Procedure: RIGHT REVERSE TOTAL SHOULDER ARTHROPLASTY;  Surgeon: Netta Cedars, MD;  Location: [facility];  Service: Orthopedics;  Laterality: Right;    Social History   Social History  . Marital status: Married    Spouse name: N/A  . Number of children: N/A  . Years of education:  N/A   Occupational History  . Not on file.   Social History Main Topics  . Smoking status: Never Smoker  . Smokeless tobacco: Never Used  . Alcohol use No  . Drug use: No  . Sexual activity: Not on file   Other Topics Concern  . Not on file   Social History Narrative  . No narrative on file     Vitals:   06/13/16 1536  BP: 138/78  Pulse: 70  SpO2: 98%  Weight: 254 lb (115.2 kg)  Height: 5' 2.5" (1.588 m)    Wt Readings from Last 3 Encounters:  06/13/16 254 lb (115.2 kg)  09/29/15 266 lb (120.7 kg)  09/19/15 266 lb 2 oz (120.7 kg)     PHYSICAL EXAM General: NAD HEENT: Normal. Neck: No  JVD, no thyromegaly. Lungs: Clear to auscultation bilaterally with normal respiratory effort. CV: Nondisplaced PMI.  Regular rate and rhythm, normal S1/S2, no K2/H0, soft systolic murmur along LSB. No pretibial or periankle edema.  No carotid bruit.   Abdomen: Soft, nontender, obese.  Neurologic: Alert and oriented.  Psych: Normal affect. Skin: Normal. Musculoskeletal: No gross deformities.    ECG: Most recent ECG reviewed.   Labs: Lab Results  Component Value Date/Time   K 4.5 09/30/2015 06:47 AM   BUN 11 09/30/2015 06:47 AM   CREATININE 1.03 (H) 09/30/2015 06:47 AM   HGB 10.7 (L) 09/30/2015 06:47 AM     Lipids: No results found for: LDLCALC, LDLDIRECT, CHOL, TRIG, HDL     ASSESSMENT AND PLAN:  1. Dyspnea on exertion: Symptomatically stable. Mitral regurgitation is now moderate to severe. She also has moderate tricuspid regurgitation with elevated pulmonary pressures, likely being exacerbated by untreated sleep apnea. She refuses CPAP. Nuclear stress testing was unremarkable. I will obtain an echocardiogram in July 2018.  2. Chest pain: Nuclear stress test on 06/15/13 did not demonstrate any suggestion of occult ischemic heart disease. No recurrence of symptoms.  3. Grade II diastolic dysfunction: No signs of diastolic decompensation with reasonably controlled blood pressure.  4. Small secundum ASD: This appears to be small and is not likely to be causing significant hemodynamic problems. If her dyspnea on exertion were to increase in frequency or severity, I would consider right heart catheterization and shunt quantification versus cardiac MRI.  5. Valvular heart disease: Moderate to severe mitral regurgitation and moderate tricuspid regurgitation by echocardiogram as detailed above. I will repeat an echocardiogram in July 2018.  6. Sleep apnea: She continues to refuse CPAP.  7. Leg edema: Controlled. No changes to therapy.  8. Essential HTN: Reasonably controlled.  No changes.    Disposition: Follow up 1 year  Kate Sable, M.D., F.A.C.C.

## 2016-06-13 NOTE — Patient Instructions (Signed)
Medication Instructions:  Continue all current medications.  Labwork: none  Testing/Procedures:  Your physician has requested that you have an echocardiogram. Echocardiography is a painless test that uses sound waves to create images of your heart. It provides your doctor with information about the size and shape of your heart and how well your heart's chambers and valves are working. This procedure takes approximately one hour. There are no restrictions for this procedure. -  July   Office will contact with results via phone or letter.     Follow-Up: Your physician wants you to follow up in:  1 year.  You will receive a reminder letter in the mail one-two months in advance.  If you don't receive a letter, please call our office to schedule the follow up appointment   Any Other Special Instructions Will Be Listed Below (If Applicable).  If you need a refill on your cardiac medications before your next appointment, please call your pharmacy.

## 2016-09-11 ENCOUNTER — Ambulatory Visit (INDEPENDENT_AMBULATORY_CARE_PROVIDER_SITE_OTHER): Payer: Medicare Other

## 2016-09-11 ENCOUNTER — Other Ambulatory Visit: Payer: Self-pay

## 2016-09-11 DIAGNOSIS — I38 Endocarditis, valve unspecified: Secondary | ICD-10-CM

## 2016-11-01 DIAGNOSIS — M545 Low back pain: Secondary | ICD-10-CM | POA: Diagnosis not present

## 2016-11-22 DIAGNOSIS — Z23 Encounter for immunization: Secondary | ICD-10-CM | POA: Diagnosis not present

## 2016-11-22 DIAGNOSIS — E039 Hypothyroidism, unspecified: Secondary | ICD-10-CM | POA: Diagnosis not present

## 2016-11-22 DIAGNOSIS — G4733 Obstructive sleep apnea (adult) (pediatric): Secondary | ICD-10-CM | POA: Diagnosis not present

## 2016-11-22 DIAGNOSIS — I10 Essential (primary) hypertension: Secondary | ICD-10-CM | POA: Diagnosis not present

## 2016-11-22 DIAGNOSIS — E1140 Type 2 diabetes mellitus with diabetic neuropathy, unspecified: Secondary | ICD-10-CM | POA: Diagnosis not present

## 2016-11-22 DIAGNOSIS — Z0000 Encounter for general adult medical examination without abnormal findings: Secondary | ICD-10-CM | POA: Diagnosis not present

## 2016-11-22 DIAGNOSIS — E785 Hyperlipidemia, unspecified: Secondary | ICD-10-CM | POA: Diagnosis not present

## 2016-11-22 DIAGNOSIS — E2839 Other primary ovarian failure: Secondary | ICD-10-CM | POA: Diagnosis not present

## 2016-11-22 DIAGNOSIS — Z794 Long term (current) use of insulin: Secondary | ICD-10-CM | POA: Diagnosis not present

## 2017-01-22 DIAGNOSIS — E2839 Other primary ovarian failure: Secondary | ICD-10-CM | POA: Diagnosis not present

## 2017-02-27 DIAGNOSIS — Z1211 Encounter for screening for malignant neoplasm of colon: Secondary | ICD-10-CM | POA: Diagnosis not present

## 2017-03-12 DIAGNOSIS — R195 Other fecal abnormalities: Secondary | ICD-10-CM | POA: Diagnosis not present

## 2017-04-30 DIAGNOSIS — K5730 Diverticulosis of large intestine without perforation or abscess without bleeding: Secondary | ICD-10-CM | POA: Diagnosis not present

## 2017-04-30 DIAGNOSIS — D126 Benign neoplasm of colon, unspecified: Secondary | ICD-10-CM | POA: Diagnosis not present

## 2017-04-30 DIAGNOSIS — R195 Other fecal abnormalities: Secondary | ICD-10-CM | POA: Diagnosis not present

## 2017-05-06 ENCOUNTER — Inpatient Hospital Stay (HOSPITAL_COMMUNITY)
Admission: EM | Admit: 2017-05-06 | Discharge: 2017-05-09 | DRG: 920 | Disposition: A | Discharge status: Home / Self Care | Payer: Medicare Other | Attending: Family Medicine | Admitting: Family Medicine

## 2017-05-06 ENCOUNTER — Other Ambulatory Visit: Payer: Self-pay

## 2017-05-06 ENCOUNTER — Encounter (HOSPITAL_COMMUNITY): Payer: Self-pay | Admitting: Emergency Medicine

## 2017-05-06 DIAGNOSIS — K922 Gastrointestinal hemorrhage, unspecified: Secondary | ICD-10-CM | POA: Diagnosis present

## 2017-05-06 DIAGNOSIS — K921 Melena: Secondary | ICD-10-CM | POA: Diagnosis not present

## 2017-05-06 DIAGNOSIS — K219 Gastro-esophageal reflux disease without esophagitis: Secondary | ICD-10-CM | POA: Diagnosis present

## 2017-05-06 DIAGNOSIS — E119 Type 2 diabetes mellitus without complications: Secondary | ICD-10-CM

## 2017-05-06 DIAGNOSIS — Z6841 Body Mass Index (BMI) 40.0 and over, adult: Secondary | ICD-10-CM

## 2017-05-06 DIAGNOSIS — D62 Acute posthemorrhagic anemia: Secondary | ICD-10-CM | POA: Diagnosis not present

## 2017-05-06 DIAGNOSIS — K5730 Diverticulosis of large intestine without perforation or abscess without bleeding: Secondary | ICD-10-CM | POA: Diagnosis present

## 2017-05-06 DIAGNOSIS — K91840 Postprocedural hemorrhage and hematoma of a digestive system organ or structure following a digestive system procedure: Principal | ICD-10-CM | POA: Diagnosis present

## 2017-05-06 DIAGNOSIS — Z794 Long term (current) use of insulin: Secondary | ICD-10-CM

## 2017-05-06 DIAGNOSIS — E782 Mixed hyperlipidemia: Secondary | ICD-10-CM | POA: Diagnosis present

## 2017-05-06 DIAGNOSIS — Z86010 Personal history of colonic polyps: Secondary | ICD-10-CM

## 2017-05-06 DIAGNOSIS — Z79890 Hormone replacement therapy (postmenopausal): Secondary | ICD-10-CM

## 2017-05-06 DIAGNOSIS — Z96653 Presence of artificial knee joint, bilateral: Secondary | ICD-10-CM | POA: Diagnosis present

## 2017-05-06 DIAGNOSIS — Z79899 Other long term (current) drug therapy: Secondary | ICD-10-CM

## 2017-05-06 DIAGNOSIS — D126 Benign neoplasm of colon, unspecified: Secondary | ICD-10-CM | POA: Diagnosis not present

## 2017-05-06 DIAGNOSIS — R531 Weakness: Secondary | ICD-10-CM | POA: Diagnosis not present

## 2017-05-06 DIAGNOSIS — M545 Low back pain: Secondary | ICD-10-CM | POA: Diagnosis present

## 2017-05-06 DIAGNOSIS — I10 Essential (primary) hypertension: Secondary | ICD-10-CM | POA: Diagnosis present

## 2017-05-06 DIAGNOSIS — Z9889 Other specified postprocedural states: Secondary | ICD-10-CM

## 2017-05-06 DIAGNOSIS — G629 Polyneuropathy, unspecified: Secondary | ICD-10-CM | POA: Diagnosis present

## 2017-05-06 DIAGNOSIS — Z7982 Long term (current) use of aspirin: Secondary | ICD-10-CM

## 2017-05-06 DIAGNOSIS — Z8249 Family history of ischemic heart disease and other diseases of the circulatory system: Secondary | ICD-10-CM

## 2017-05-06 DIAGNOSIS — G4733 Obstructive sleep apnea (adult) (pediatric): Secondary | ICD-10-CM | POA: Diagnosis present

## 2017-05-06 DIAGNOSIS — Z96611 Presence of right artificial shoulder joint: Secondary | ICD-10-CM | POA: Diagnosis present

## 2017-05-06 DIAGNOSIS — Y848 Other medical procedures as the cause of abnormal reaction of the patient, or of later complication, without mention of misadventure at the time of the procedure: Secondary | ICD-10-CM

## 2017-05-06 LAB — CBC
HCT: 31.7 % — ABNORMAL LOW (ref 36.0–46.0)
HEMOGLOBIN: 10.1 g/dL — AB (ref 12.0–15.0)
MCH: 29.9 pg (ref 26.0–34.0)
MCHC: 31.9 g/dL (ref 30.0–36.0)
MCV: 93.8 fL (ref 78.0–100.0)
Platelets: 295 10*3/uL (ref 150–400)
RBC: 3.38 MIL/uL — ABNORMAL LOW (ref 3.87–5.11)
RDW: 14.6 % (ref 11.5–15.5)
WBC: 11.5 10*3/uL — ABNORMAL HIGH (ref 4.0–10.5)

## 2017-05-06 NOTE — ED Notes (Signed)
PT WENT TO BATHROOM IN LOBBY PT CAME OUT AND HAD DIFFICULTY STANDING PT PALE AND DIAPHORETIC PT TAKEN TO TRIAGE AND RN INFORMED

## 2017-05-06 NOTE — ED Triage Notes (Signed)
Pt states she had a colonoscopy with polyp removal on the 13th   Pt states tonight she started having rectal bleeding and has had 3 episodes  Pt states it is dark maroon colored  Pt states the third episode was the worst  Pt had an episode in the lobby bathroom and when she was done pt came out pale and diaphoretic  Pt states she feels weak all over

## 2017-05-06 NOTE — ED Notes (Signed)
Pt unable to stand long enough to obtain standing orthostatic vital sign.

## 2017-05-07 ENCOUNTER — Inpatient Hospital Stay (HOSPITAL_COMMUNITY): Payer: Medicare Other | Admitting: Anesthesiology

## 2017-05-07 ENCOUNTER — Encounter (HOSPITAL_COMMUNITY): Payer: Self-pay | Admitting: Registered Nurse

## 2017-05-07 ENCOUNTER — Encounter (HOSPITAL_COMMUNITY)
Admission: EM | Disposition: A | Discharge status: Home / Self Care | Payer: Self-pay | Source: Home / Self Care | Attending: Family Medicine

## 2017-05-07 DIAGNOSIS — Z8249 Family history of ischemic heart disease and other diseases of the circulatory system: Secondary | ICD-10-CM | POA: Diagnosis not present

## 2017-05-07 DIAGNOSIS — Z86010 Personal history of colonic polyps: Secondary | ICD-10-CM | POA: Diagnosis not present

## 2017-05-07 DIAGNOSIS — G629 Polyneuropathy, unspecified: Secondary | ICD-10-CM | POA: Diagnosis present

## 2017-05-07 DIAGNOSIS — G4733 Obstructive sleep apnea (adult) (pediatric): Secondary | ICD-10-CM | POA: Diagnosis not present

## 2017-05-07 DIAGNOSIS — Z7982 Long term (current) use of aspirin: Secondary | ICD-10-CM | POA: Diagnosis not present

## 2017-05-07 DIAGNOSIS — Z79899 Other long term (current) drug therapy: Secondary | ICD-10-CM | POA: Diagnosis not present

## 2017-05-07 DIAGNOSIS — K219 Gastro-esophageal reflux disease without esophagitis: Secondary | ICD-10-CM | POA: Diagnosis not present

## 2017-05-07 DIAGNOSIS — D62 Acute posthemorrhagic anemia: Secondary | ICD-10-CM | POA: Diagnosis not present

## 2017-05-07 DIAGNOSIS — K922 Gastrointestinal hemorrhage, unspecified: Secondary | ICD-10-CM | POA: Diagnosis not present

## 2017-05-07 DIAGNOSIS — M545 Low back pain: Secondary | ICD-10-CM | POA: Diagnosis present

## 2017-05-07 DIAGNOSIS — K91840 Postprocedural hemorrhage and hematoma of a digestive system organ or structure following a digestive system procedure: Secondary | ICD-10-CM | POA: Diagnosis not present

## 2017-05-07 DIAGNOSIS — Z794 Long term (current) use of insulin: Secondary | ICD-10-CM | POA: Diagnosis not present

## 2017-05-07 DIAGNOSIS — E782 Mixed hyperlipidemia: Secondary | ICD-10-CM | POA: Diagnosis not present

## 2017-05-07 DIAGNOSIS — Z96653 Presence of artificial knee joint, bilateral: Secondary | ICD-10-CM | POA: Diagnosis not present

## 2017-05-07 DIAGNOSIS — Z9889 Other specified postprocedural states: Secondary | ICD-10-CM | POA: Diagnosis not present

## 2017-05-07 DIAGNOSIS — Z79890 Hormone replacement therapy (postmenopausal): Secondary | ICD-10-CM | POA: Diagnosis not present

## 2017-05-07 DIAGNOSIS — Z96611 Presence of right artificial shoulder joint: Secondary | ICD-10-CM | POA: Diagnosis not present

## 2017-05-07 DIAGNOSIS — I10 Essential (primary) hypertension: Secondary | ICD-10-CM | POA: Diagnosis present

## 2017-05-07 DIAGNOSIS — Y848 Other medical procedures as the cause of abnormal reaction of the patient, or of later complication, without mention of misadventure at the time of the procedure: Secondary | ICD-10-CM | POA: Diagnosis not present

## 2017-05-07 DIAGNOSIS — Z6841 Body Mass Index (BMI) 40.0 and over, adult: Secondary | ICD-10-CM | POA: Diagnosis not present

## 2017-05-07 DIAGNOSIS — D509 Iron deficiency anemia, unspecified: Secondary | ICD-10-CM | POA: Diagnosis not present

## 2017-05-07 DIAGNOSIS — K5730 Diverticulosis of large intestine without perforation or abscess without bleeding: Secondary | ICD-10-CM | POA: Diagnosis not present

## 2017-05-07 DIAGNOSIS — E119 Type 2 diabetes mellitus without complications: Secondary | ICD-10-CM

## 2017-05-07 DIAGNOSIS — K921 Melena: Secondary | ICD-10-CM | POA: Diagnosis not present

## 2017-05-07 HISTORY — PX: COLONOSCOPY WITH PROPOFOL: SHX5780

## 2017-05-07 LAB — BASIC METABOLIC PANEL
Anion gap: 8 (ref 5–15)
BUN: 26 mg/dL — ABNORMAL HIGH (ref 6–20)
CALCIUM: 7.9 mg/dL — AB (ref 8.9–10.3)
CO2: 24 mmol/L (ref 22–32)
CREATININE: 1.19 mg/dL — AB (ref 0.44–1.00)
Chloride: 107 mmol/L (ref 101–111)
GFR calc non Af Amer: 44 mL/min — ABNORMAL LOW (ref >60)
GFR, EST AFRICAN AMERICAN: 51 mL/min — AB (ref >60)
Glucose, Bld: 184 mg/dL — ABNORMAL HIGH (ref 65–99)
Potassium: 5.1 mmol/L (ref 3.5–5.1)
SODIUM: 139 mmol/L (ref 135–145)

## 2017-05-07 LAB — CBC
HCT: 23.6 % — ABNORMAL LOW (ref 36.0–46.0)
HEMATOCRIT: 26.3 % — AB (ref 36.0–46.0)
HEMOGLOBIN: 7.7 g/dL — AB (ref 12.0–15.0)
Hemoglobin: 8.3 g/dL — ABNORMAL LOW (ref 12.0–15.0)
MCH: 30 pg (ref 26.0–34.0)
MCH: 30.8 pg (ref 26.0–34.0)
MCHC: 31.6 g/dL (ref 30.0–36.0)
MCHC: 32.6 g/dL (ref 30.0–36.0)
MCV: 94.9 fL (ref 78.0–100.0)
MCV: 94.4 fL (ref 78.0–100.0)
PLATELETS: 219 10*3/uL (ref 150–400)
Platelets: 234 10*3/uL (ref 150–400)
RBC: 2.77 MIL/uL — ABNORMAL LOW (ref 3.87–5.11)
RBC: 2.5 MIL/uL — AB (ref 3.87–5.11)
RDW: 14.8 % (ref 11.5–15.5)
RDW: 14.7 % (ref 11.5–15.5)
WBC: 9.2 10*3/uL (ref 4.0–10.5)
WBC: 9.5 10*3/uL (ref 4.0–10.5)

## 2017-05-07 LAB — COMPREHENSIVE METABOLIC PANEL
ALK PHOS: 80 U/L (ref 38–126)
ALT: 10 U/L — ABNORMAL LOW (ref 14–54)
AST: 20 U/L (ref 15–41)
Albumin: 3.2 g/dL — ABNORMAL LOW (ref 3.5–5.0)
Anion gap: 9 (ref 5–15)
BILIRUBIN TOTAL: 0.2 mg/dL — AB (ref 0.3–1.2)
BUN: 22 mg/dL — ABNORMAL HIGH (ref 6–20)
CALCIUM: 8.5 mg/dL — AB (ref 8.9–10.3)
CO2: 22 mmol/L (ref 22–32)
Chloride: 105 mmol/L (ref 101–111)
Creatinine, Ser: 1.29 mg/dL — ABNORMAL HIGH (ref 0.44–1.00)
GFR calc Af Amer: 46 mL/min — ABNORMAL LOW (ref >60)
GFR, EST NON AFRICAN AMERICAN: 40 mL/min — AB (ref >60)
Glucose, Bld: 307 mg/dL — ABNORMAL HIGH (ref 65–99)
Potassium: 5.2 mmol/L — ABNORMAL HIGH (ref 3.5–5.1)
Sodium: 136 mmol/L (ref 135–145)
TOTAL PROTEIN: 6.3 g/dL — AB (ref 6.5–8.1)

## 2017-05-07 LAB — TYPE AND SCREEN
ABO/RH(D): O POS
Antibody Screen: NEGATIVE

## 2017-05-07 LAB — GLUCOSE, CAPILLARY
GLUCOSE-CAPILLARY: 213 mg/dL — AB (ref 65–99)
GLUCOSE-CAPILLARY: 115 mg/dL — AB (ref 65–99)
GLUCOSE-CAPILLARY: 172 mg/dL — AB (ref 65–99)
GLUCOSE-CAPILLARY: 136 mg/dL — AB (ref 65–99)
Glucose-Capillary: 119 mg/dL — ABNORMAL HIGH (ref 65–99)
Glucose-Capillary: 158 mg/dL — ABNORMAL HIGH (ref 65–99)
Glucose-Capillary: 92 mg/dL (ref 65–99)

## 2017-05-07 LAB — ABO/RH: ABO/RH(D): O POS

## 2017-05-07 LAB — TROPONIN I: Troponin I: 0.04 ng/mL (ref <0.03)

## 2017-05-07 SURGERY — COLONOSCOPY WITH PROPOFOL
Anesthesia: Monitor Anesthesia Care

## 2017-05-07 MED ORDER — LEVOTHYROXINE SODIUM 50 MCG PO TABS
50.0000 ug | ORAL_TABLET | Freq: Every day | ORAL | Status: DC
  Administered 2017-05-07 – 2017-05-09 (×3): 50 ug via ORAL
  Filled 2017-05-07 (×3): qty 1

## 2017-05-07 MED ORDER — PROPOFOL 10 MG/ML IV BOLUS
INTRAVENOUS | Status: DC | PRN
  Administered 2017-05-07: 20 mg via INTRAVENOUS

## 2017-05-07 MED ORDER — ONDANSETRON HCL 4 MG/2ML IJ SOLN
INTRAMUSCULAR | Status: DC | PRN
  Administered 2017-05-07: 4 mg via INTRAVENOUS

## 2017-05-07 MED ORDER — PROPOFOL 10 MG/ML IV BOLUS
INTRAVENOUS | Status: AC
  Filled 2017-05-07: qty 20

## 2017-05-07 MED ORDER — INSULIN ASPART 100 UNIT/ML ~~LOC~~ SOLN
0.0000 [IU] | SUBCUTANEOUS | Status: DC
  Administered 2017-05-07 – 2017-05-08 (×4): 2 [IU] via SUBCUTANEOUS
  Administered 2017-05-08: 3 [IU] via SUBCUTANEOUS
  Administered 2017-05-09: 1 [IU] via SUBCUTANEOUS

## 2017-05-07 MED ORDER — GABAPENTIN 300 MG PO CAPS
300.0000 mg | ORAL_CAPSULE | Freq: Every day | ORAL | Status: DC
  Administered 2017-05-07 – 2017-05-08 (×2): 300 mg via ORAL
  Filled 2017-05-07 (×2): qty 1

## 2017-05-07 MED ORDER — SODIUM CHLORIDE 0.9 % IV SOLN: INTRAVENOUS | Status: DC

## 2017-05-07 MED ORDER — ACETAMINOPHEN 325 MG PO TABS
650.0000 mg | ORAL_TABLET | Freq: Four times a day (QID) | ORAL | Status: DC | PRN
  Administered 2017-05-08: 650 mg via ORAL
  Filled 2017-05-07: qty 2

## 2017-05-07 MED ORDER — DULOXETINE HCL 30 MG PO CPEP
60.0000 mg | ORAL_CAPSULE | Freq: Every day | ORAL | Status: DC
  Administered 2017-05-07 – 2017-05-09 (×3): 60 mg via ORAL
  Filled 2017-05-07 (×4): qty 2

## 2017-05-07 MED ORDER — LACTATED RINGERS IV SOLN
INTRAVENOUS | Status: DC
  Administered 2017-05-07: 10:00:00 via INTRAVENOUS

## 2017-05-07 MED ORDER — ONDANSETRON HCL 4 MG PO TABS: 4.0000 mg | ORAL_TABLET | Freq: Four times a day (QID) | ORAL | Status: DC | PRN

## 2017-05-07 MED ORDER — ONDANSETRON HCL 4 MG/2ML IJ SOLN: 4.0000 mg | Freq: Four times a day (QID) | INTRAMUSCULAR | Status: DC | PRN

## 2017-05-07 MED ORDER — PROPOFOL 10 MG/ML IV BOLUS
INTRAVENOUS | Status: AC
  Filled 2017-05-07: qty 40

## 2017-05-07 MED ORDER — ONDANSETRON HCL 4 MG/2ML IJ SOLN
4.0000 mg | Freq: Once | INTRAMUSCULAR | Status: AC
  Administered 2017-05-07: 4 mg via INTRAVENOUS
  Filled 2017-05-07: qty 2

## 2017-05-07 MED ORDER — INSULIN GLARGINE 100 UNIT/ML ~~LOC~~ SOLN
30.0000 [IU] | Freq: Every day | SUBCUTANEOUS | Status: DC
  Administered 2017-05-07 – 2017-05-08 (×2): 30 [IU] via SUBCUTANEOUS
  Filled 2017-05-07 (×3): qty 0.3

## 2017-05-07 MED ORDER — POLYETHYLENE GLYCOL 3350 17 G PO PACK
17.0000 g | PACK | Freq: Two times a day (BID) | ORAL | Status: DC
  Administered 2017-05-07 – 2017-05-09 (×5): 17 g via ORAL
  Filled 2017-05-07 (×6): qty 1

## 2017-05-07 MED ORDER — SODIUM CHLORIDE 0.9 % IV BOLUS (SEPSIS)
1000.0000 mL | Freq: Once | INTRAVENOUS | Status: AC
  Administered 2017-05-07: 1000 mL via INTRAVENOUS

## 2017-05-07 MED ORDER — SODIUM CHLORIDE 0.9 % IV BOLUS (SEPSIS): 500.0000 mL | Freq: Once | INTRAVENOUS | Status: DC

## 2017-05-07 MED ORDER — PRAVASTATIN SODIUM 40 MG PO TABS
40.0000 mg | ORAL_TABLET | Freq: Every day | ORAL | Status: DC
  Administered 2017-05-07 – 2017-05-08 (×2): 40 mg via ORAL
  Filled 2017-05-07 (×2): qty 1

## 2017-05-07 MED ORDER — ACETAMINOPHEN 650 MG RE SUPP: 650.0000 mg | Freq: Four times a day (QID) | RECTAL | Status: DC | PRN

## 2017-05-07 MED ORDER — LIDOCAINE 2% (20 MG/ML) 5 ML SYRINGE
INTRAMUSCULAR | Status: DC | PRN
  Administered 2017-05-07: 80 mg via INTRAVENOUS

## 2017-05-07 MED ORDER — SODIUM CHLORIDE 0.9 % IV SOLN
INTRAVENOUS | Status: DC
  Administered 2017-05-07 – 2017-05-08 (×4): via INTRAVENOUS

## 2017-05-07 MED ORDER — FLEET ENEMA 7-19 GM/118ML RE ENEM
1.0000 | ENEMA | RECTAL | Status: AC
  Administered 2017-05-07 (×2): 1 via RECTAL
  Filled 2017-05-07 (×2): qty 1

## 2017-05-07 MED ORDER — PROPOFOL 500 MG/50ML IV EMUL
INTRAVENOUS | Status: DC | PRN
  Administered 2017-05-07: 100 ug/kg/min via INTRAVENOUS

## 2017-05-07 SURGICAL SUPPLY — 22 items

## 2017-05-07 NOTE — Anesthesia Preprocedure Evaluation (Addendum)
Anesthesia Evaluation  Patient identified by MRN, date of birth, ID band Patient awake    Reviewed: Allergy & Precautions, NPO status , Patient's Chart, lab work & pertinent test results, reviewed documented beta blocker date and time   History of Anesthesia Complications Negative for: history of anesthetic complications  Airway Mallampati: III  TM Distance: >3 FB Neck ROM: Full    Dental  (+) Dental Advisory Given,    Pulmonary sleep apnea ,    Pulmonary exam normal breath sounds clear to auscultation       Cardiovascular hypertension, Pt. on medications and Pt. on home beta blockers +CHF (diastolic dysfunction)  + Valvular Problems/Murmurs MR  Rhythm:Regular Rate:Normal + Systolic murmurs '18 TTE - Mild LVH. EF 50% to 55%. Grade 2 diastolic dysfunction. Trivial AI. Moderate MR directed eccentrically. Left atrium was mildly dilated. PFO versus secundum  ASD with left-to-right shunting by color Doppler. Mild TR. PASP 38 mm Hg.   Neuro/Psych Peripheral neuropathy    GI/Hepatic Neg liver ROS, GERD  Controlled and Medicated,  Endo/Other  diabetes, Type 2, Oral Hypoglycemic Agents, Insulin DependentMorbid obesity  Renal/GU Renal InsufficiencyRenal disease     Musculoskeletal Low back pain   Abdominal (+) + obese,   Peds  Hematology  (+) anemia ,   Anesthesia Other Findings HLD  Reproductive/Obstetrics                            Anesthesia Physical  Anesthesia Plan  ASA: III  Anesthesia Plan: MAC   Post-op Pain Management:    Induction: Intravenous  PONV Risk Score and Plan: Propofol infusion and Treatment may vary due to age or medical condition  Airway Management Planned: Natural Airway and Simple Face Mask  Additional Equipment: None  Intra-op Plan:   Post-operative Plan:   Informed Consent: I have reviewed the patients History and Physical, chart, labs and discussed the  procedure including the risks, benefits and alternatives for the proposed anesthesia with the patient or authorized representative who has indicated his/her understanding and acceptance.   Dental advisory given  Plan Discussed with: CRNA and Anesthesiologist  Anesthesia Plan Comments:       Anesthesia Quick Evaluation

## 2017-05-07 NOTE — Anesthesia Postprocedure Evaluation (Signed)
Anesthesia Post Note  Patient: Carollee Herter  Procedure(s) Performed: COLONOSCOPY WITH PROPOFOL (N/A )     Patient location during evaluation: PACU Anesthesia Type: MAC Level of consciousness: awake and alert Pain management: pain level controlled Vital Signs Assessment: post-procedure vital signs reviewed and stable Respiratory status: spontaneous breathing, nonlabored ventilation and respiratory function stable Cardiovascular status: stable and blood pressure returned to baseline Anesthetic complications: no    Last Vitals:  Vitals:   05/07/17 1110 05/07/17 1113  BP: (!) 108/35 (!) 125/96  Pulse: 84 83  Resp: 19 18  Temp:    SpO2: 97% 98%    Last Pain:  Vitals:   05/07/17 0930  TempSrc: Oral  PainSc:                  Audry Pili

## 2017-05-07 NOTE — Progress Notes (Signed)
Patient's colonoscopy was well-tolerated.  It appears to me that the patient has stopped bleeding.  There was old, dark red blood throughout the colon, but no evidence of bright red blood or active bleeding.  The patient's polypectomy sites were never identified, perhaps because of the residual blood which could only partially be suctioned up despite approximately 2 L of lavage during the procedure.  The patient does have extensive left-sided diverticulosis, so it is possible that this is actually a diverticular bleed, rather than a post polypectomy bleed.  In my experience, it is common for diverticular bleeding to recur after a brief period of quiescence, whereas post-polypectomy bleeding, once it stops, almost always stays stopped, so the patient's prognosis is different depending on which of these is the case in her situation.  Recommendations:  1.  I will start a clear liquid diet 2.  I have ordered MiraLAX to help flush old blood out of the GI tract. 3.  I have ordered serial CBCs to monitor the patient's hemoglobin 4.  If the patient shows evidence of recurrent bleeding, I would favor doing a nuclear medicine bleeding scan, to try to localize the origin of bleeding.  If it is from the proximal colon, it is probably from 1 of her polypectomy sites, whereas if it is from the distal colon, it would more likely be from her diverticulosis. 5.  I think it will take at least 1 or 2 days of in-house observation before we know if the patient can be discharged. 6.  I would recommend holding the patient's aspirin for the next week.  We will follow with you, but please call us if you have any questions or would like to discuss her case.  Have discussed findings with patient and husband, and will notify hospitalist.  Cleotis Nipper, M.D. Pager [phone] If no answer or after 5 PM call [phone]

## 2017-05-07 NOTE — H&P (Signed)
History and Physical    Melissa Zhang ZSW:[id number] DOB: 07/04/1940 DOA: 05/06/2017  PCP: Briscoe Deutscher, MD  Patient coming from: Home  I have personally briefly reviewed patient's old medical records in [place]  Chief Complaint: Rectal bleeding  HPI: Melissa Zhang is a 77 y.o. female with medical history significant of OSA, DM2, HTN.  Patient had colonoscopy with polypectomy x6 days ago for asymptomatic + hemoccult.  Multiple polyps removed.  Was doing fine till this evening when she developed multiple episodes of dark colored bloody stools with clots.  3 episodes thus far, 2 at home 1 in ED.  Had near syncopal episode after ED BM.   ED Course: HGB 10.1.  EDP spoke with Dr. Kalman Shan who says to admit, keep NPO, he will see in AM.   Review of Systems: As per HPI otherwise 10 point review of systems negative.   Past Medical History:  Diagnosis Date  . Allergic rhinitis   . Esophageal reflux   . Hypertension   . Low back pain   . Mixed hyperlipidemia   . Obstructive sleep apnea   . Peripheral neuropathy   . Pneumonia    history  . Type 2 diabetes mellitus ([place] [place])     Past Surgical History:  Procedure Laterality Date  . ABDOMINAL HYSTERECTOMY    . CHOLECYSTECTOMY    . COLONOSCOPY    . CYSTECTOMY     LEFT BREAST  . EYE SURGERY Bilateral    caratacts  . REPLACEMENT TOTAL KNEE BILATERAL    . REVERSE SHOULDER ARTHROPLASTY Right 09/29/2015   Procedure: RIGHT REVERSE TOTAL SHOULDER ARTHROPLASTY;  Surgeon: Netta Cedars, MD;  Location: [facility];  Service: Orthopedics;  Laterality: Right;     reports that  has never smoked. she has never used smokeless tobacco. She reports that she does not drink alcohol or use drugs.  Allergies  Allergen Reactions  . Lisinopril Anaphylaxis and Swelling    TONGUE SWELLING  . Actos [Pioglitazone] Swelling    UNSPECIFIED   . Losartan Other (See Comments)    Made potassium levels go up CAUTION: SEVERE REACTION TO ACE-I ? ANAPHYLAXIS  ?  Melissa Zhang Kitchen Ciprofloxacin Nausea Only    Family History  Problem Relation Age of Onset  . Heart attack Father   . Hypertension Father   . Diabetes Father      Prior to Admission medications   Medication Sig Start Date End Date Taking? Authorizing Provider  aspirin 81 MG tablet Take 81 mg by mouth daily.   Yes [provider]  bisoprolol (ZEBETA) 5 MG tablet Take 5 mg by mouth daily. 09/08/15  Yes [provider]  DULoxetine (CYMBALTA) 60 MG capsule Take 60 mg by mouth daily. 03/05/13  Yes [provider]  gabapentin (NEURONTIN) 300 MG capsule Take 300 mg by mouth at bedtime. 04/11/13  Yes [provider]  glimepiride (AMARYL) 4 MG tablet Take 4 mg by mouth 2 (two) times daily. 02/26/13  Yes [provider]  hydrochlorothiazide (MICROZIDE) 12.5 MG capsule Take 1 capsule (12.5 mg total) by mouth daily. 12/10/13  Yes Herminio Commons, MD  LANTUS SOLOSTAR 100 UNIT/ML Solostar Pen Inject 60 Units into the skin at bedtime.  03/01/13  Yes [provider]  levothyroxine (SYNTHROID, LEVOTHROID) 50 MCG tablet Take 50 mcg by mouth daily. 02/26/13  Yes [provider]  lovastatin (MEVACOR) 40 MG tablet Take 40 mg by mouth at bedtime. 03/26/13  Yes [provider]  metFORMIN (GLUCOPHAGE)  1000 MG tablet Take 1,000 mg by mouth 2 (two) times daily. 02/26/13  Yes [provider]    Physical Exam: Vitals:   05/07/17 0015 05/07/17 0130 05/07/17 0231 05/07/17 0300  BP: 130/61 136/67 (!) 144/62 137/68  Pulse: 73 68 67 72  Resp: 15 15 18  (!) 26  Temp:      TempSrc:      SpO2: 95% 100% 100% 100%  Weight:      Height:        Constitutional: NAD, calm, comfortable Eyes: PERRL, lids and conjunctivae normal ENMT: Mucous membranes are moist. Posterior pharynx clear of any exudate or lesions.Normal dentition.  Neck: normal, supple, no masses, no thyromegaly Respiratory: clear to auscultation bilaterally, no wheezing, no crackles. Normal  respiratory effort. No accessory muscle use.  Cardiovascular: Regular rate and rhythm, no murmurs / rubs / gallops. No extremity edema. 2+ pedal pulses. No carotid bruits.  Abdomen: no tenderness, no masses palpated. No hepatosplenomegaly. Bowel sounds positive.  Musculoskeletal: no clubbing / cyanosis. No joint deformity upper and lower extremities. Good ROM, no contractures. Normal muscle tone.  Skin: no rashes, lesions, ulcers. No induration Neurologic: CN 2-12 grossly intact. Sensation intact, DTR normal. Strength 5/5 in all 4.  Psychiatric: Normal judgment and insight. Alert and oriented x 3. Normal mood.    Labs on Admission: I have personally reviewed following labs and imaging studies  CBC: Recent Labs  Lab 05/06/17 2345  WBC 11.5*  HGB 10.1*  HCT 31.7*  MCV 93.8  PLT 272   Basic Metabolic Panel: Recent Labs  Lab 05/06/17 2345  NA 136  K 5.2*  CL 105  CO2 22  GLUCOSE 307*  BUN 22*  CREATININE 1.29*  CALCIUM 8.5*   GFR: Estimated Creatinine Clearance: 46.9 mL/min (A) (by C-G formula based on SCr of 1.29 mg/dL (H)). Liver Function Tests: Recent Labs  Lab 05/06/17 2345  AST 20  ALT 10*  ALKPHOS 80  BILITOT 0.2*  PROT 6.3*  ALBUMIN 3.2*   No results for input(s): LIPASE, AMYLASE in the last 168 hours. No results for input(s): AMMONIA in the last 168 hours. Coagulation Profile: No results for input(s): INR, PROTIME in the last 168 hours. Cardiac Enzymes: Recent Labs  Lab 05/06/17 2354  TROPONINI 0.04*   BNP (last 3 results) No results for input(s): PROBNP in the last 8760 hours. HbA1C: No results for input(s): HGBA1C in the last 72 hours. CBG: No results for input(s): GLUCAP in the last 168 hours. Lipid Profile: No results for input(s): CHOL, HDL, LDLCALC, TRIG, CHOLHDL, LDLDIRECT in the last 72 hours. Thyroid Function Tests: No results for input(s): TSH, T4TOTAL, FREET4, T3FREE, THYROIDAB in the last 72 hours. Anemia Panel: No results for  input(s): VITAMINB12, FOLATE, FERRITIN, TIBC, IRON, RETICCTPCT in the last 72 hours. Urine analysis: No results found for: COLORURINE, APPEARANCEUR, LABSPEC, PHURINE, GLUCOSEU, HGBUR, BILIRUBINUR, KETONESUR, PROTEINUR, UROBILINOGEN, NITRITE, LEUKOCYTESUR  Radiological Exams on Admission: No results found.  EKG: Independently reviewed.  Assessment/Plan Principal Problem:   GI bleed Active Problems:   DM2 (diabetes mellitus, type 2) ([facility])   Status post colonoscopy with polypectomy   HTN (hypertension)    1. GI bleed - 1. NPO 2. GI to see in AM 3. IVF: NS at 125 cc/hr 4. Repeat H/H at 0800 2. DM2 - 1. Lantus 30 QHS 2. Sensitive SSI Q4H 3. HTN - holding home BP meds due to near syncope with last bloody BM  DVT prophylaxis: SCDs Code Status: Full Family Communication:  No family in room Disposition Plan: Home after admit Consults called: ED spoke with Dr. Kalman Shan Admission status: Admit to inpatient   [place], [place] [facility] Pager [phone]  If 7AM-7PM, please contact day team taking care of patient [email or web address] Password [facility]  05/07/2017, 3:23 AM

## 2017-05-07 NOTE — Op Note (Signed)
[facility] Patient Name: Melissa Zhang Procedure Date: 05/07/2017 MRN: [id number] Attending MD: Ronald Lobo , MD Date of Birth: 12-24-1940 CSN: [id number] Age: 77 Admit Type: Inpatient Procedure:                Colonoscopy Indications:              Last colonoscopy one week ago (6                            small-to-medium-sized polyps removed by hot snare,                            patient on low-dose ASA), 1 day h/o large-volume                            hematochezia with 4 gm drop in hgb, Treatment of                            possible bleeding from polypectomy site Providers:                Ronald Lobo, MD, Elmer Ramp. Tilden Dome, RN, Cherylynn Ridges, Technician Referring MD:              Medicines:                Monitored Anesthesia Care Complications:            No immediate complications. Estimated Blood Loss:     Estimated blood loss: none. Procedure:                Pre-Anesthesia Assessment:                           - Prior to the procedure, a History and Physical                            was performed, and patient medications and                            allergies were reviewed. The patient's tolerance of                            previous anesthesia was also reviewed. The risks                            and benefits of the procedure and the sedation                            options and risks were discussed with the patient.                            All questions were answered, and informed consent  was obtained. Prior Anticoagulants: The patient has                            taken aspirin, last dose was 1 day prior to                            procedure. ASA Grade Assessment: III - A patient                            with severe systemic disease. After reviewing the                            risks and benefits, the patient was deemed in                            satisfactory condition to  undergo the procedure.                           After obtaining informed consent, the colonoscope                            was passed under direct vision. Throughout the                            procedure, the patient's blood pressure, pulse, and                            oxygen saturations were monitored continuously. The                            EC-[id number] ([id number]) scope was introduced through                            the anus and advanced to the the cecum, identified                            by appendiceal orifice and ileocecal valve. The                            colonoscopy was somewhat difficult due to                            significant looping. Successful completion of the                            procedure was aided by using manual pressure. The                            patient tolerated the procedure well. The quality                            of the bowel preparation (2 Fleet enemas) was fair.  The ileocecal valve, the appendiceal orifice and                            the rectum were photographed. Scope In: 9:55:03 AM Scope Out: 10:38:00 AM Scope Withdrawal Time: 0 hours 32 minutes 57 seconds  Total Procedure Duration: 0 hours 42 minutes 57 seconds  Findings:      Multiple medium-mouthed diverticula were found in the sigmoid colon and       descending colon.      Dark red blood was found in the entire colon. No bright red blood, to       suggest active bleeding, was seen. No large clots seen.      The exam was otherwise normal throughout the examined colon. The       polypectomy sites from a week ago were never visualized, although the       residual blood, which puddled and also coated the colonic wall, may well       have obscured them.      There is no endoscopic evidence of inflammation, mass, polyps or       ulcerations in the entire colon.      The retroflexed view of the distal rectum and anal verge was normal and        showed no anal or rectal abnormalities. Impression:               - No evidence of active bleeding.                           - Preparation of the colon was fair, impeding                            optimal visualization. Recent polypectomy sites                            never visualized.                           - Extensive diverticulosis in the sigmoid colon and                            in the descending colon.                           - Old blood in the entire examined colon.                           - The distal rectum and anal verge are normal on                            retroflexion view.                           - No specimens collected. Moderate Sedation:      This patient was sedated with monitored anesthesia care, not moderate       sedation. Recommendation:           - Clear liquid diet today. Low-dose Miralax to help  flush out the old blood.                           - No aspirin, ibuprofen, naproxen, or other                            non-steroidal anti-inflammatory drugs for 7 weeks.                           - Monitor hemoglobin and stool output; consider                            bleeding scan if evidence of ongoing/recurrent                            bleeding. (The patient's polypectomies were in the                            proximal colon, whereas her diverticulosis appears                            to be limited to the distal colon, so it might be                            possible to differentiate the cause by localizing                            the site of origin.)                           - Repeat colonoscopy date to be determined after                            pending pathology results from her recent                            colonoscopy are reviewed for surveillance based on                            pathology results. Procedure Code(s):        --- Professional ---                           [phone], Colonoscopy,  flexible; diagnostic, including                            collection of specimen(s) by brushing or washing,                            when performed (separate procedure) Diagnosis Code(s):        --- Professional ---                           V40.981, Postprocedural hemorrhage of a digestive  system organ or structure following a digestive                            system procedure                           K92.2, Gastrointestinal hemorrhage, unspecified                           K57.30, Diverticulosis of large intestine without                            perforation or abscess without bleeding CPT copyright 2016 American Medical Association. All rights reserved. The codes documented in this report are preliminary and upon coder review may  be revised to meet current compliance requirements. Ronald Lobo, MD 05/07/2017 10:54:57 AM This report has been signed electronically. Number of Addenda: 0

## 2017-05-07 NOTE — Consult Note (Signed)
Referring Provider: No ref. provider found Primary Care Physician:  Briscoe Deutscher, MD Primary Gastroenterologist:  Dr. Therisa Doyne  Reason for Consultation:  Hematochezia  HPI: Melissa Zhang is a 77 y.o. female who is one-week status post removal of 6 small to medium sized polyps (largest 10 mm in diameter) from the proximal and mid colon, by hot snare technique by Dr. Therisa Doyne.  She is on daily low-dose aspirin for primary cardiac prophylaxis, which has been continued since the time of her procedure.  Yesterday evening, she had the first of approximately 5 passages of bloody stool, painless, associated with near syncope in the emergency room.  The volume became moderately large, double handfuls of clots.  Her hemoglobin in the emergency room yesterday evening was 10.1, and overnight it has dropped to 8.3, as compared to a baseline hemoglobin of 12.4 back in October 2018.  BUN has been just mildly elevated, 26 this morning.  The patient denies abdominal pain.  She received a 1 L fluid bolus in the emergency room last night and was able to use the bedside commode this morning without dizziness.  The patient does have diabetes with peripheral neuropathy and a history of sleep apnea as well as dyslipidemia.  She denies shortness of breath or pain at this time.  Past Medical History:  Diagnosis Date  . Allergic rhinitis   . Esophageal reflux   . Hypertension   . Low back pain   . Mixed hyperlipidemia   . Obstructive sleep apnea   . Peripheral neuropathy   . Pneumonia    history  . Type 2 diabetes mellitus ([place])     Past Surgical History:  Procedure Laterality Date  . ABDOMINAL HYSTERECTOMY    . CHOLECYSTECTOMY    . COLONOSCOPY    . CYSTECTOMY     LEFT BREAST  . EYE SURGERY Bilateral    caratacts  . REPLACEMENT TOTAL KNEE BILATERAL    . REVERSE SHOULDER ARTHROPLASTY Right 09/29/2015   Procedure: RIGHT REVERSE TOTAL SHOULDER ARTHROPLASTY;  Surgeon: Netta Cedars, MD;  Location: [facility];   Service: Orthopedics;  Laterality: Right;    Prior to Admission medications   Medication Sig Start Date End Date Taking? Authorizing Provider  aspirin 81 MG tablet Take 81 mg by mouth daily.   Yes [provider]  bisoprolol (ZEBETA) 5 MG tablet Take 5 mg by mouth daily. 09/08/15  Yes [provider]  DULoxetine (CYMBALTA) 60 MG capsule Take 60 mg by mouth daily. 03/05/13  Yes [provider]  gabapentin (NEURONTIN) 300 MG capsule Take 300 mg by mouth at bedtime. 04/11/13  Yes [provider]  glimepiride (AMARYL) 4 MG tablet Take 4 mg by mouth 2 (two) times daily. 02/26/13  Yes [provider]  hydrochlorothiazide (MICROZIDE) 12.5 MG capsule Take 1 capsule (12.5 mg total) by mouth daily. 12/10/13  Yes Herminio Commons, MD  LANTUS SOLOSTAR 100 UNIT/ML Solostar Pen Inject 60 Units into the skin at bedtime.  03/01/13  Yes [provider]  levothyroxine (SYNTHROID, LEVOTHROID) 50 MCG tablet Take 50 mcg by mouth daily. 02/26/13  Yes [provider]  lovastatin (MEVACOR) 40 MG tablet Take 40 mg by mouth at bedtime. 03/26/13  Yes [provider]  metFORMIN (GLUCOPHAGE) 1000 MG tablet Take 1,000 mg by mouth 2 (two) times daily. 02/26/13  Yes [provider]    Current Facility-Administered Medications  Medication Dose Route Frequency Provider Last Rate Last Dose  . 0.9 %  sodium chloride infusion  Intravenous Continuous Etta Quill, DO   Stopped at 05/07/17 0910  . 0.9 %  sodium chloride infusion   Intravenous Continuous Trustin Chapa, MD      . acetaminophen (TYLENOL) tablet 650 mg  650 mg Oral Q6H PRN Etta Quill, DO       Or  . acetaminophen (TYLENOL) suppository 650 mg  650 mg Rectal Q6H PRN Etta Quill, DO      . DULoxetine (CYMBALTA) DR capsule 60 mg  60 mg Oral Daily Jennette Kettle M, DO      . gabapentin (NEURONTIN) capsule 300 mg  300 mg Oral QHS Jennette Kettle M, DO      . insulin aspart (novoLOG)  injection 0-9 Units  0-9 Units Subcutaneous Q4H Etta Quill, DO   2 Units at 05/07/17 0756  . insulin glargine (LANTUS) injection 30 Units  30 Units Subcutaneous QHS Jennette Kettle M, DO      . levothyroxine (SYNTHROID, LEVOTHROID) tablet 50 mcg  50 mcg Oral Daily Alcario Drought, Jared M, DO      . ondansetron [facility]) tablet 4 mg  4 mg Oral Q6H PRN Etta Quill, DO       Or  . ondansetron [facility]) injection 4 mg  4 mg Intravenous Q6H PRN Etta Quill, DO      . pravastatin (PRAVACHOL) tablet 40 mg  40 mg Oral q1800 Etta Quill, DO        Allergies as of 05/06/2017 - Review Complete 05/06/2017  Allergen Reaction Noted  . Lisinopril Anaphylaxis and Swelling 06/04/2013  . Actos [pioglitazone] Swelling 06/03/2013  . Losartan Other (See Comments) 09/18/2015  . Ciprofloxacin Nausea Only 06/04/2013    Family History  Problem Relation Age of Onset  . Heart attack Father   . Hypertension Father   . Diabetes Father     Social History   Socioeconomic History  . Marital status: Married    Spouse name: Not on file  . Number of children: Not on file  . Years of education: Not on file  . Highest education level: Not on file  Social Needs  . Financial resource strain: Not on file  . Food insecurity - worry: Not on file  . Food insecurity - inability: Not on file  . Transportation needs - medical: Not on file  . Transportation needs - non-medical: Not on file  Occupational History  . Not on file  Tobacco Use  . Smoking status: Never Smoker  . Smokeless tobacco: Never Used  Substance and Sexual Activity  . Alcohol use: No    Alcohol/week: 0.0 oz  . Drug use: No  . Sexual activity: Not on file  Other Topics Concern  . Not on file  Social History Narrative  . Not on file    Review of Systems: Please see HPI  Physical Exam: Vital signs in last 24 hours: Temp:  [97.5 F (36.4 C)-97.8 F (36.6 C)] 97.5 F (36.4 C) (03/20 0343) Pulse Rate:  [67-92] 75 (03/20  0343) Resp:  [15-26] 20 (03/20 0343) BP: (118-144)/(35-106) 143/35 (03/20 0343) SpO2:  [95 %-100 %] 100 % (03/20 0343) Weight:  [119.1 kg (262 lb 9.6 oz)] 119.1 kg (262 lb 9.6 oz) (03/19 2242)   General:   Alert,  Well-developed, well-nourished, moderately overweight, pleasant and cooperative in NAD.  Radial pulses full, skin is warm and well perfused.  The patient does not appear volume contracted or shocky. Head:  Normocephalic and atraumatic. Eyes:  Sclera  clear, no icterus.   Conjunctiva slightly pale. Mouth:   No ulcerations or lesions.  Oropharynx pink & moist. Lungs:  Clear throughout to auscultation.   No wheezes, crackles, or rhonchi. No evident respiratory distress. Heart:   Regular rate and rhythm; no murmurs, clicks, rubs,  or gallops. Abdomen:  Soft, severely obese, nontender, nontympanitic, and nondistended. No masses, hepatosplenomegaly or ventral hernias noted.  Msk:   Symmetrical without gross deformities. Pulses:  Normal, full radial pulse is noted. Extremities:   Without clubbing, cyanosis, or edema.  Lower extremities have chronic fullness without pitting edema.  Not clear if it is adiposity or chronic lymphedema. Neurologic:  Alert and coherent;  grossly normal neurologically. Skin:  Intact without significant lesions or rashes. Psych:   Alert and cooperative. Normal mood and affect.  Intake/Output from previous day: 03/19 0701 - 03/20 0700 In: 1000 [IV Piggyback:1000] Out: -  Intake/Output this shift: No intake/output data recorded.  Lab Results: Recent Labs    05/06/17 2345 05/07/17 0625  WBC 11.5* 9.2  HGB 10.1* 8.3*  HCT 31.7* 26.3*  PLT 295 234   BMET Recent Labs    05/06/17 2345 05/07/17 0625  NA 136 139  K 5.2* 5.1  CL 105 107  CO2 22 24  GLUCOSE 307* 184*  BUN 22* 26*  CREATININE 1.29* 1.19*  CALCIUM 8.5* 7.9*   LFT Recent Labs    05/06/17 2345  PROT 6.3*  ALBUMIN 3.2*  AST 20  ALT 10*  ALKPHOS 80  BILITOT 0.2*   PT/INR No  results for input(s): LABPROT, INR in the last 72 hours.  Studies/Results: No results found.  Impression: 1.  Acute GI bleed, most likely lower tract origin, most likely from post polypectomy bleeding 2.  Acute posthemorrhagic anemia 3.  Diabetes, neuropathy, sleep apnea, dyslipidemia 4.  Multiple polyps removed recently, pathology pending  Plan: Proceed to colonoscopic evaluation, the purpose and risks of which were reviewed with the patient.  It is often possible to find the bleeding source and, if appropriate, perform intervention such as clipping or injection.  At the very least, I hope to be able to determine whether or not there is still evidence of active bleeding (that is, fresh blood).  In my experience, once patients with post polypectomy bleeding stopped bleeding, they usually do not have recurrence of the bleeding.   LOS: 0 days   Jersey Ravenscroft V  05/07/2017, 9:27 AM   Pager [phone] If no answer or after 5 PM call [phone]

## 2017-05-07 NOTE — ED Notes (Signed)
ED TO INPATIENT HANDOFF REPORT  Name/Age/Gender Melissa Zhang 77 y.o. female  Code Status    Code Status Orders  (From admission, onward)        Start     Ordered   05/07/17 0255  Full code  Continuous     05/07/17 0255    Code Status History    Date Active Date Inactive Code Status Order ID Comments User Context   09/29/2015 13:56 10/01/2015 19:49 Full Code [id number]  Netta Cedars, MD Inpatient      Home/SNF/Other Home  Chief Complaint Post-op; Rectal Bleeding  Level of Care/Admitting Diagnosis ED Disposition    ED Disposition Condition [facility] Area: [place] [[id number]]  Level of Care: Telemetry [5]  Admit to tele based on following criteria: Other see comments  Comments: GI bleed  Diagnosis: GI bleed [[id number]]  Admitting Physician: Doreatha Massed  Attending Physician: Etta Quill [phone]  Estimated length of stay: past midnight tomorrow  Certification:: I certify this patient will need inpatient services for at least 2 midnights  PT Class (Do Not Modify): Inpatient [101]  PT Acc Code (Do Not Modify): Private [1]       Medical History Past Medical History:  Diagnosis Date  . Allergic rhinitis   . Esophageal reflux   . Hypertension   . Low back pain   . Mixed hyperlipidemia   . Obstructive sleep apnea   . Peripheral neuropathy   . Pneumonia    history  . Type 2 diabetes mellitus ([facility])     Allergies Allergies  Allergen Reactions  . Lisinopril Anaphylaxis and Swelling    TONGUE SWELLING  . Actos [Pioglitazone] Swelling    UNSPECIFIED   . Losartan Other (See Comments)    Made potassium levels go up CAUTION: SEVERE REACTION TO ACE-I ? ANAPHYLAXIS ?  Marland Kitchen Ciprofloxacin Nausea Only    IV Location/Drains/Wounds Patient Lines/Drains/Airways Status   Active Line/Drains/Airways    Name:   Placement date:   Placement time:   Site:   Days:   Peripheral IV 05/06/17 Right Antecubital   05/06/17    2324     Antecubital   1   Incision (Closed) 09/29/15 Shoulder Right   09/29/15    1110     586          Labs/Imaging Results for orders placed or performed during the hospital encounter of 05/06/17 (from the past 48 hour(s))  Comprehensive metabolic panel     Status: Abnormal   Collection Time: 05/06/17 11:45 PM  Result Value Ref Range   Sodium 136 135 - 145 mmol/L   Potassium 5.2 (H) 3.5 - 5.1 mmol/L   Chloride 105 101 - 111 mmol/L   CO2 22 22 - 32 mmol/L   Glucose, Bld 307 (H) 65 - 99 mg/dL   BUN 22 (H) 6 - 20 mg/dL   Creatinine, Ser 1.29 (H) 0.44 - 1.00 mg/dL   Calcium 8.5 (L) 8.9 - 10.3 mg/dL   Total Protein 6.3 (L) 6.5 - 8.1 g/dL   Albumin 3.2 (L) 3.5 - 5.0 g/dL   AST 20 15 - 41 U/L   ALT 10 (L) 14 - 54 U/L   Alkaline Phosphatase 80 38 - 126 U/L   Total Bilirubin 0.2 (L) 0.3 - 1.2 mg/dL   GFR calc non Af Amer 40 (L) >60 mL/min   GFR calc Af Amer 46 (L) >60 mL/min    Comment: (NOTE) The  eGFR has been calculated using the CKD EPI equation. This calculation has not been validated in all clinical situations. eGFR's persistently <60 mL/min signify possible Chronic Kidney Disease.    Anion gap 9 5 - 15    Comment: Performed at [facility], [place] [address] [place], [place] [address]  CBC     Status: Abnormal   Collection Time: 05/06/17 11:45 PM  Result Value Ref Range   WBC 11.5 (H) 4.0 - 10.5 K/uL   RBC 3.38 (L) 3.87 - 5.11 MIL/uL   Hemoglobin 10.1 (L) 12.0 - 15.0 g/dL   HCT 31.7 (L) 36.0 - 46.0 %   MCV 93.8 78.0 - 100.0 fL   MCH 29.9 26.0 - 34.0 pg   MCHC 31.9 30.0 - 36.0 g/dL   RDW 14.6 11.5 - 15.5 %   Platelets 295 150 - 400 K/uL    Comment: Performed at [facility], [place] [address] [place], [place] [address]  Type and screen [place]     Status: None   Collection Time: 05/06/17 11:45 PM  Result Value Ref Range   ABO/RH(D) O POS    Antibody Screen NEG    Sample Expiration      05/09/2017 Performed at  [facility], [place] [address], [place], [place] [address]   ABO/Rh     Status: None   Collection Time: 05/06/17 11:45 PM  Result Value Ref Range   ABO/RH(D)      O POS Performed at [facility], [place] [address], [place], [place] [address]   Troponin I     Status: Abnormal   Collection Time: 05/06/17 11:54 PM  Result Value Ref Range   Troponin I 0.04 (HH) <0.03 ng/mL    Comment: CRITICAL RESULT CALLED TO, READ BACK BY AND VERIFIED WITH: ADKINS,L RN 3.20.19 _0  ZANDO,C Performed at [facility], [place] [address] [place], [place] [address]    No results found.  Pending Labs Unresulted Labs (From admission, onward)   Start     Ordered   05/07/17 0800  Hemoglobin and hematocrit, blood  Once-Timed,   R     05/07/17 0251      Vitals/Pain Today's Vitals   05/07/17 0001 05/07/17 0015 05/07/17 0130 05/07/17 0231  BP: 127/64 130/61 136/67 (!) 144/62  Pulse: 67 73 68 67  Resp: _1 Temp:      TempSrc:      SpO2: 96% 95% 100% 100%  Weight:      Height:      PainSc:        Isolation Precautions No active isolations  Medications Medications  gabapentin (NEURONTIN) capsule 300 mg (not administered)  DULoxetine (CYMBALTA) DR capsule 60 mg (not administered)  levothyroxine (SYNTHROID, LEVOTHROID) tablet 50 mcg (not administered)  pravastatin (PRAVACHOL) tablet 40 mg (not administered)  insulin glargine (LANTUS) injection 30 Units (not administered)  insulin aspart (novoLOG) injection 0-9 Units (not administered)  acetaminophen (TYLENOL) tablet 650 mg (not administered)    Or  acetaminophen (TYLENOL) suppository 650 mg (not administered)  ondansetron (ZOFRAN) tablet 4 mg (not administered)    Or  ondansetron (ZOFRAN) injection 4 mg (not administered)  0.9 %  sodium chloride infusion (not administered)  sodium chloride 0.9 % bolus 1,000 mL (0 mLs Intravenous Stopped 05/07/17 0100)  ondansetron (ZOFRAN) injection 4  mg (4 mg Intravenous Given 05/07/17 0244)    Mobility walks

## 2017-05-07 NOTE — Progress Notes (Signed)
Cymbalta moved to 2200 per patient's request.

## 2017-05-07 NOTE — Transfer of Care (Signed)
Immediate Anesthesia Transfer of Care Note  Patient: Melissa Zhang  Procedure(s) Performed: COLONOSCOPY WITH PROPOFOL (N/A )  Patient Location: PACU  Anesthesia Type:MAC  Level of Consciousness: sedated  Airway & Oxygen Therapy: Patient Spontanous Breathing and Patient connected to face mask oxygen  Post-op Assessment: Report given to RN and Post -op Vital signs reviewed and stable  Post vital signs: Reviewed and stable  Last Vitals:  Vitals:   05/07/17 0343 05/07/17 0930  BP: (!) 143/35 (!) 152/51  Pulse: 75 74  Resp: 20 20  Temp: (!) 36.4 C 36.6 C  SpO2: 100% 98%    Last Pain:  Vitals:   05/07/17 0930  TempSrc: Oral  PainSc:          Complications: No apparent anesthesia complications

## 2017-05-07 NOTE — Anesthesia Procedure Notes (Signed)
Date/Time: 05/07/2017 9:47 AM Performed by: Talbot Grumbling, CRNA Oxygen Delivery Method: Simple face mask

## 2017-05-07 NOTE — ED Provider Notes (Signed)
[facility] DEPT Provider Note   CSN: [id number] Arrival date & time: 05/06/17  2233     History   Chief Complaint Chief Complaint  Patient presents with  . Rectal Bleeding    HPI Melissa Zhang is a 77 y.o. female.  Patient presents with multiple episodes today of dark, black stools today. She reports colonoscopy 6 days ago ([facility]) in evaluation of a positive hemoccult found on routine exam. She was asymptomatic when hemoccult was done. Colonoscopy showed multiple polyps that were snared and sent for biopsy - results not available yet. She was doing well until today when she passed "just dark blood" x 3. No abdominal pain, nausea or vomiting. She has experienced lightheadedness/dizziness without syncope today. No fever.    The history is provided by the patient and the spouse. No language interpreter was used.  Rectal Bleeding  Associated symptoms: dizziness   Associated symptoms: no fever     Past Medical History:  Diagnosis Date  . Allergic rhinitis   . Esophageal reflux   . Hypertension   . Low back pain   . Mixed hyperlipidemia   . Obstructive sleep apnea   . Peripheral neuropathy   . Pneumonia    history  . Type 2 diabetes mellitus [facility])     Patient Active Problem List   Diagnosis Date Noted  . S/P shoulder replacement 09/29/2015  . SOB (shortness of breath) 06/07/2013  . Valvular heart disease 06/07/2013  . Diastolic dysfunction, left ventricle 06/07/2013    Past Surgical History:  Procedure Laterality Date  . ABDOMINAL HYSTERECTOMY    . CHOLECYSTECTOMY    . COLONOSCOPY    . CYSTECTOMY     LEFT BREAST  . EYE SURGERY Bilateral    caratacts  . REPLACEMENT TOTAL KNEE BILATERAL    . REVERSE SHOULDER ARTHROPLASTY Right 09/29/2015   Procedure: RIGHT REVERSE TOTAL SHOULDER ARTHROPLASTY;  Surgeon: Netta Cedars, MD;  Location: [facility];  Service: Orthopedics;  Laterality: Right;    OB History    No data available        Home Medications    Prior to Admission medications   Medication Sig Start Date End Date Taking? Authorizing Provider  aspirin 81 MG tablet Take 81 mg by mouth daily.   Yes [provider]  bisoprolol (ZEBETA) 5 MG tablet Take 5 mg by mouth daily. 09/08/15  Yes [provider]  DULoxetine (CYMBALTA) 60 MG capsule Take 60 mg by mouth daily. 03/05/13  Yes [provider]  gabapentin (NEURONTIN) 300 MG capsule Take 300 mg by mouth at bedtime. 04/11/13  Yes [provider]  glimepiride (AMARYL) 4 MG tablet Take 4 mg by mouth 2 (two) times daily. 02/26/13  Yes [provider]  hydrochlorothiazide (MICROZIDE) 12.5 MG capsule Take 1 capsule (12.5 mg total) by mouth daily. 12/10/13  Yes Herminio Commons, MD  LANTUS SOLOSTAR 100 UNIT/ML Solostar Pen Inject 60 Units into the skin at bedtime.  03/01/13  Yes [provider]  levothyroxine (SYNTHROID, LEVOTHROID) 50 MCG tablet Take 50 mcg by mouth daily. 02/26/13  Yes [provider]  lovastatin (MEVACOR) 40 MG tablet Take 40 mg by mouth at bedtime. 03/26/13  Yes [provider]  metFORMIN (GLUCOPHAGE) 1000 MG tablet Take 1,000 mg by mouth 2 (two) times daily. 02/26/13  Yes [provider]    Family History Family History  Problem Relation Age of Onset  . Heart attack Father   . Hypertension Father   .  Diabetes Father     Social History Social History   Tobacco Use  . Smoking status: Never Smoker  . Smokeless tobacco: Never Used  Substance Use Topics  . Alcohol use: No    Alcohol/week: 0.0 oz  . Drug use: No     Allergies   Lisinopril; Actos [pioglitazone]; Losartan; and Ciprofloxacin   Review of Systems Review of Systems  Constitutional: Positive for diaphoresis. Negative for chills and fever.  HENT: Negative.   Respiratory: Negative.  Negative for shortness of breath.   Cardiovascular: Negative.  Negative for chest pain.  Gastrointestinal: Positive for  blood in stool and hematochezia.  Musculoskeletal: Negative.   Skin: Negative.   Neurological: Positive for dizziness and weakness.     Physical Exam Updated Vital Signs BP (!) 118/106 (BP Location: Left Arm)   Pulse 92   Temp 97.8 F (36.6 C) (Oral)   Resp 16   Ht 5\' 2"  (1.575 m)   Wt 119.1 kg (262 lb 9.6 oz)   SpO2 100%   BMI 48.03 kg/m   Physical Exam  Constitutional: She is oriented to person, place, and time. She appears well-developed and well-nourished.  HENT:  Head: Normocephalic.  Neck: Normal range of motion. Neck supple.  Cardiovascular: Normal rate and regular rhythm.  Pulmonary/Chest: Effort normal and breath sounds normal.  Abdominal: Soft. Bowel sounds are normal. There is no tenderness. There is no rebound and no guarding.  Musculoskeletal: Normal range of motion.  Neurological: She is alert and oriented to person, place, and time.  Skin: Skin is warm and dry. No rash noted.  Psychiatric: She has a normal mood and affect.     ED Treatments / Results  Labs (all labs ordered are listed, but only abnormal results are displayed) Labs Reviewed  CBC - Abnormal; Notable for the following components:      Result Value   WBC 11.5 (*)    RBC 3.38 (*)    Hemoglobin 10.1 (*)    HCT 31.7 (*)    All other components within normal limits  COMPREHENSIVE METABOLIC PANEL  POC OCCULT BLOOD, ED  TYPE AND SCREEN   Results for orders placed or performed during the hospital encounter of 05/06/17  Comprehensive metabolic panel  Result Value Ref Range   Sodium 136 135 - 145 mmol/L   Potassium 5.2 (H) 3.5 - 5.1 mmol/L   Chloride 105 101 - 111 mmol/L   CO2 22 22 - 32 mmol/L   Glucose, Bld 307 (H) 65 - 99 mg/dL   BUN 22 (H) 6 - 20 mg/dL   Creatinine, Ser 1.29 (H) 0.44 - 1.00 mg/dL   Calcium 8.5 (L) 8.9 - 10.3 mg/dL   Total Protein 6.3 (L) 6.5 - 8.1 g/dL   Albumin 3.2 (L) 3.5 - 5.0 g/dL   AST 20 15 - 41 U/L   ALT 10 (L) 14 - 54 U/L   Alkaline Phosphatase 80 38 -  126 U/L   Total Bilirubin 0.2 (L) 0.3 - 1.2 mg/dL   GFR calc non Af Amer 40 (L) >60 mL/min   GFR calc Af Amer 46 (L) >60 mL/min   Anion gap 9 5 - 15  CBC  Result Value Ref Range   WBC 11.5 (H) 4.0 - 10.5 K/uL   RBC 3.38 (L) 3.87 - 5.11 MIL/uL   Hemoglobin 10.1 (L) 12.0 - 15.0 g/dL   HCT 31.7 (L) 36.0 - 46.0 %   MCV 93.8 78.0 - 100.0 fL   MCH  29.9 26.0 - 34.0 pg   MCHC 31.9 30.0 - 36.0 g/dL   RDW 14.6 11.5 - 15.5 %   Platelets 295 150 - 400 K/uL  Troponin I  Result Value Ref Range   Troponin I 0.04 (HH) <0.03 ng/mL  Type and screen [place]  Result Value Ref Range   ABO/RH(D) O POS    Antibody Screen NEG    Sample Expiration      05/09/2017 Performed at [facility], [place] [address], [place], [place] [address]   ABO/Rh  Result Value Ref Range   ABO/RH(D)      O POS Performed at [facility] [address] [place], [place] [address]     EKG  EKG Interpretation None       Radiology No results found.  Procedures Procedures (including critical care time)  Medications Ordered in ED Medications - No data to display   Initial Impression / Assessment and Plan / ED Course  I have reviewed the triage vital signs and the nursing notes.  Pertinent labs & imaging results that were available during my care of the patient were reviewed by me and considered in my medical decision making (see chart for details).     Patient presents with lower GI bleeding that started today. She reports passing 3 bowel movements prior to arrival that was "dark blood". She has been lightheaded and felt as if she were going to pass out but no full syncope. She denies pain.   Patient is diaphoretic and pale. She is fully awake and oriented. Abdomen is benign.   VSS. No tachycardia or hypotension. Hgb 10.5. IVF's provided and condition improves. She has had one BM here that is clotted blood.  Troponin is elevated to 0.04, felt to  be demand ischemia. No chest pain. EKG shows no acute changes.   Discussed with Dr. Cristina Gong [facility]) who will consult on the patient and plan for repeat colonoscopy. Patient to be kept NPO. He is not sure at this time when the scope will be done.  Will admit to the hospitalist. Patient is considered hemodynamically stable now.   Final Clinical Impressions(s) / ED Diagnoses   Final diagnoses:  None   1. Lower GI bleeding  ED Discharge Orders    None       Charlann Lange, Hershal Coria 05/07/17 [place], April, MD 05/07/17 [phone]

## 2017-05-07 NOTE — Progress Notes (Signed)
[facility]   Patient admitted by my partner after midnight for full details see H&P Chart reviewed, patient seen and examined.  Patient is a 77 year old female with medical history significant for OSA, diabetes mellitus type 2, hypertension who had a recent colonoscopy with polypectomy 6 days prior to admission.  Now presented with bloody stool and clots.  Patient was admitted for GI evaluation with working diagnosis of possible polypectomy bleed.  Patient underwent colonoscopy which tolerated well, bleeding site was not identified but patient does have extensive left-sided diverticulosis raising concern for possible diverticular bleed.  GI recommending advance diet to clear liquids, MiraLAX to flush out all blood clots Serial CBC monitoring.  If recurrent bleeding may need nuclear bleeding scan. We will continue to monitor H&H closely and transfuse if hemoglobin less than 7.  Rest per H&P   Chipper Oman, MD

## 2017-05-08 DIAGNOSIS — I10 Essential (primary) hypertension: Secondary | ICD-10-CM

## 2017-05-08 DIAGNOSIS — K922 Gastrointestinal hemorrhage, unspecified: Secondary | ICD-10-CM

## 2017-05-08 DIAGNOSIS — Z794 Long term (current) use of insulin: Secondary | ICD-10-CM

## 2017-05-08 DIAGNOSIS — E119 Type 2 diabetes mellitus without complications: Secondary | ICD-10-CM

## 2017-05-08 LAB — BASIC METABOLIC PANEL
Anion gap: 7 (ref 5–15)
BUN: 16 mg/dL (ref 6–20)
CHLORIDE: 110 mmol/L (ref 101–111)
CO2: 24 mmol/L (ref 22–32)
CREATININE: 1.07 mg/dL — AB (ref 0.44–1.00)
Calcium: 7.9 mg/dL — ABNORMAL LOW (ref 8.9–10.3)
GFR calc non Af Amer: 50 mL/min — ABNORMAL LOW (ref >60)
GFR, EST AFRICAN AMERICAN: 58 mL/min — AB (ref >60)
Glucose, Bld: 117 mg/dL — ABNORMAL HIGH (ref 65–99)
POTASSIUM: 4.8 mmol/L (ref 3.5–5.1)
SODIUM: 141 mmol/L (ref 135–145)

## 2017-05-08 LAB — CBC
HCT: 23.9 % — ABNORMAL LOW (ref 36.0–46.0)
HCT: 22.4 % — ABNORMAL LOW (ref 36.0–46.0)
HEMOGLOBIN: 7.7 g/dL — AB (ref 12.0–15.0)
HEMOGLOBIN: 7.1 g/dL — AB (ref 12.0–15.0)
MCH: 30.6 pg (ref 26.0–34.0)
MCH: 30.1 pg (ref 26.0–34.0)
MCHC: 32.2 g/dL (ref 30.0–36.0)
MCHC: 31.7 g/dL (ref 30.0–36.0)
MCV: 94.8 fL (ref 78.0–100.0)
MCV: 94.9 fL (ref 78.0–100.0)
Platelets: 203 10*3/uL (ref 150–400)
Platelets: 194 10*3/uL (ref 150–400)
RBC: 2.52 MIL/uL — AB (ref 3.87–5.11)
RBC: 2.36 MIL/uL — AB (ref 3.87–5.11)
RDW: 14.9 % (ref 11.5–15.5)
RDW: 15 % (ref 11.5–15.5)
WBC: 7.5 10*3/uL (ref 4.0–10.5)
WBC: 6.2 10*3/uL (ref 4.0–10.5)

## 2017-05-08 LAB — GLUCOSE, CAPILLARY
GLUCOSE-CAPILLARY: 84 mg/dL (ref 65–99)
GLUCOSE-CAPILLARY: 109 mg/dL — AB (ref 65–99)
GLUCOSE-CAPILLARY: 207 mg/dL — AB (ref 65–99)
GLUCOSE-CAPILLARY: 169 mg/dL — AB (ref 65–99)
Glucose-Capillary: 159 mg/dL — ABNORMAL HIGH (ref 65–99)
Glucose-Capillary: 94 mg/dL (ref 65–99)

## 2017-05-08 NOTE — Progress Notes (Signed)
[facility] PROGRESS NOTE Subjective Patient states that she has had no more bright blood.  Colonoscopy by Dr. Cristina Gong yesterday did not show active bleeding site.  She did have diverticulosis.  Objective: Vital signs in last 24 hours: Temp:  [97.5 F (36.4 C)-97.8 F (36.6 C)] 97.5 F (36.4 C) (03/21 0421) Pulse Rate:  [77-84] 80 (03/21 0421) Resp:  [18-20] 19 (03/21 0421) BP: (129)/(51-59) 129/53 (03/21 0421) SpO2:  [96 %-100 %] 96 % (03/21 0421) Last BM Date: 05/08/17  Intake/Output from previous day: 03/20 0701 - 03/21 0700 In: 2454.8 [P.O.:240; I.V.:2214.8] Out: -  Intake/Output this shift: No intake/output data recorded.  PE: General--obese white female in no acute distress  Abdomen--soft and nontender  Lab Results: Recent Labs    05/06/17 2345 05/07/17 0625 05/07/17 1452 05/08/17 0620  WBC 11.5* 9.2 9.5 7.5  HGB 10.1* 8.3* 7.7* 7.7*  HCT 31.7* 26.3* 23.6* 23.9*  PLT 295 234 219 203   BMET Recent Labs    05/06/17 2345 05/07/17 0625 05/08/17 0620  NA 136 139 141  K 5.2* 5.1 4.8  CL 105 107 110  CO2 22 24 24   CREATININE 1.29* 1.19* 1.07*   LFT Recent Labs    05/06/17 2345  PROT 6.3*  AST 20  ALT 10*  ALKPHOS 80  BILITOT 0.2*   PT/INR No results for input(s): LABPROT, INR in the last 72 hours. PANCREAS No results for input(s): LIPASE in the last 72 hours.       Studies/Results: No results found.  Medications: I have reviewed the patient's current medications.  Assessment:   1.  Lower GI bleed.  Questionable post polypectomy bleed approximately a week after colonoscopy versus diverticular bleed.  Colonoscopy yesterday did not show active bleeding.  Hemoglobin is stable   Plan: 1.  Would continue on full liquids for now and if stool is brown and hemoglobin shows return upward she probably could be discharged on a low residue diet.   Nancy Fetter 05/08/2017, 12:36 PM  This note was created using voice recognition  software. Minor errors may Have occurred unintentionally.  Pager: [phone] If no answer or after hours call [phone]

## 2017-05-08 NOTE — Progress Notes (Signed)
PROGRESS NOTE [facility]   Melissa Zhang   LKG:[id number] DOB: 02/09/1941  DOA: 05/06/2017 PCP: Briscoe Deutscher, MD   Brief Narrative:  Melissa Zhang is a 77 year old female with medical history of OSA, diabetes mellitus type 2, hypertension, had a recent colonoscopy with polypectomies 6 days prior to admission.  She presented to the emergency department complaining of bloody stools with clots.  patient was admitted with working diagnosis of possible post polypectomy bleed versus diverticular bleed.  Patient underwent colonoscopy, bleeding site was not identified but patient does have extensive left side diverticulosis.  H&H been monitored.  Subjective: Patient seen and examined, she denies any abdominal pain.  Bowel movement clearing up, last night apparently with some blood residual.  Denies nausea and diarrhea.  Assessment & Plan: Lower GI bleed Suspect post polypectomy versus diverticular bleed Hemoglobin 7.7, patient is asymptomatic will hold on transfusion for now. Continue to monitor H&H and confused if hemoglobin less than 7. Will advance diet to full liquids and advance pending GI evaluation. Continue MiraLAX  Acute blood loss anemia from GI bleed See above Check anemia panel in the morning  Diabetes mellitus type 2 CBGs stable Continue current management  Hypertension BP stable, due to near syncope episode in the emergency department Will continue to monitor BP for now antihypertensive medications were hold and resume medications if BP becomes unstable  DVT prophylaxis: SCDs Code Status: Full code Family Communication: None at bedside Disposition Plan: Home in a.m. if hemoglobins remained stable and bleeding has stopped  Consultants:   GI  Procedures:   Colonoscopy  Antimicrobials:  None  Objective: Vitals:   05/07/17 1113 05/07/17 1358 05/07/17 2013 05/08/17 0421  BP: (!) 125/96 (!) 129/59 (!) 129/51 (!) 129/53  Pulse: 83 84 77 80  Resp: 18  20 18 19   Temp:  97.7 F (36.5 C) 97.8 F (36.6 C) (!) 97.5 F (36.4 C)  TempSrc:  Oral Oral Oral  SpO2: 98% 100% 97% 96%  Weight:      Height:        Intake/Output Summary (Last 24 hours) at 05/08/2017 1206 Last data filed at 05/08/2017 6644 Gross per 24 hour  Intake 1709 ml  Output -  Net 1709 ml   Filed Weights   05/06/17 2242 05/07/17 0930  Weight: 119.1 kg (262 lb 9.6 oz) 119.1 kg (262 lb 9.6 oz)    Examination:  General exam: Appears calm and comfortable  HEENT: OP moist and clear Respiratory system: Clear to auscultation. No wheezes,crackle or rhonchi Cardiovascular system: S1 & S2 heard, RRR. No JVD, murmurs, rubs or gallops Gastrointestinal system: Abdomen is nondistended, soft and nontender.  Central nervous system: Alert and oriented. No focal neurological deficits. Extremities: No pedal edema.  Skin: No rashes Psychiatry: Mood & affect appropriate.    Data Reviewed: I have personally reviewed following labs and imaging studies  CBC: Recent Labs  Lab 05/06/17 2345 05/07/17 0625 05/07/17 1452 05/08/17 0620  WBC 11.5* 9.2 9.5 7.5  HGB 10.1* 8.3* 7.7* 7.7*  HCT 31.7* 26.3* 23.6* 23.9*  MCV 93.8 94.9 94.4 94.8  PLT 295 234 219 034   Basic Metabolic Panel: Recent Labs  Lab 05/06/17 2345 05/07/17 0625 05/08/17 0620  NA 136 139 141  K 5.2* 5.1 4.8  CL 105 107 110  CO2 22 24 24   GLUCOSE 307* 184* 117*  BUN 22* 26* 16  CREATININE 1.29* 1.19* 1.07*  CALCIUM 8.5* 7.9* 7.9*   GFR: Estimated Creatinine Clearance: 56.6  mL/min (A) (by C-G formula based on SCr of 1.07 mg/dL (H)). Liver Function Tests: Recent Labs  Lab 05/06/17 2345  AST 20  ALT 10*  ALKPHOS 80  BILITOT 0.2*  PROT 6.3*  ALBUMIN 3.2*   No results for input(s): LIPASE, AMYLASE in the last 168 hours. No results for input(s): AMMONIA in the last 168 hours. Coagulation Profile: No results for input(s): INR, PROTIME in the last 168 hours. Cardiac Enzymes: Recent Labs  Lab  05/06/17 2354  TROPONINI 0.04*   BNP (last 3 results) No results for input(s): PROBNP in the last 8760 hours. HbA1C: No results for input(s): HGBA1C in the last 72 hours. CBG: Recent Labs  Lab 05/07/17 2103 05/07/17 2342 05/08/17 0440 05/08/17 0740 05/08/17 1117  GLUCAP 136* 92 84 109* 207*   Lipid Profile: No results for input(s): CHOL, HDL, LDLCALC, TRIG, CHOLHDL, LDLDIRECT in the last 72 hours. Thyroid Function Tests: No results for input(s): TSH, T4TOTAL, FREET4, T3FREE, THYROIDAB in the last 72 hours. Anemia Panel: No results for input(s): VITAMINB12, FOLATE, FERRITIN, TIBC, IRON, RETICCTPCT in the last 72 hours. Sepsis Labs: No results for input(s): PROCALCITON, LATICACIDVEN in the last 168 hours.  No results found for this or any previous visit (from the past 240 hour(s)).    Radiology Studies: No results found.    Scheduled Meds: . DULoxetine  60 mg Oral Daily  . gabapentin  300 mg Oral QHS  . insulin aspart  0-9 Units Subcutaneous Q4H  . insulin glargine  30 Units Subcutaneous QHS  . levothyroxine  50 mcg Oral Daily  . polyethylene glycol  17 g Oral BID  . pravastatin  40 mg Oral q1800   Continuous Infusions:   LOS: 1 day   Time spent: Total of 25 minutes spent with pt, greater than 50% of which was spent in discussion of  treatment, counseling and coordination of care   Chipper Oman, MD Pager: Text Page via [email or web address]   If 7PM-7AM, please contact night-coverage [email or web address] 05/08/2017, 12:06 PM   Note - This record has been created using [employer]. Chart creation errors have been sought, but may not always have been located. Such creation errors do not reflect on the standard of medical care.

## 2017-05-09 ENCOUNTER — Encounter (HOSPITAL_COMMUNITY): Payer: Self-pay | Admitting: Gastroenterology

## 2017-05-09 LAB — CBC
HCT: 23.4 % — ABNORMAL LOW (ref 36.0–46.0)
Hemoglobin: 7.7 g/dL — ABNORMAL LOW (ref 12.0–15.0)
MCH: 31 pg (ref 26.0–34.0)
MCHC: 32.9 g/dL (ref 30.0–36.0)
MCV: 94.4 fL (ref 78.0–100.0)
PLATELETS: 201 10*3/uL (ref 150–400)
RBC: 2.48 MIL/uL — AB (ref 3.87–5.11)
RDW: 14.9 % (ref 11.5–15.5)
WBC: 5.9 10*3/uL (ref 4.0–10.5)

## 2017-05-09 LAB — GLUCOSE, CAPILLARY
GLUCOSE-CAPILLARY: 94 mg/dL (ref 65–99)
Glucose-Capillary: 95 mg/dL (ref 65–99)
Glucose-Capillary: 146 mg/dL — ABNORMAL HIGH (ref 65–99)

## 2017-05-09 MED ORDER — FERROUS SULFATE 325 (65 FE) MG PO TABS
325.0000 mg | ORAL_TABLET | Freq: Two times a day (BID) | ORAL | Status: DC
  Administered 2017-05-09: 325 mg via ORAL
  Filled 2017-05-09: qty 1

## 2017-05-09 MED ORDER — POLYSACCHARIDE IRON COMPLEX 150 MG PO CAPS: 150.0000 mg | ORAL_CAPSULE | Freq: Every day | ORAL | 0 refills | Status: DC

## 2017-05-09 MED ORDER — POLYETHYLENE GLYCOL 3350 17 G PO PACK: 17.0000 g | PACK | Freq: Two times a day (BID) | ORAL | 0 refills | Status: DC

## 2017-05-09 NOTE — Progress Notes (Signed)
Subjective: The patient was seen and examined at bedside. She reports having a small but brown stool yesterday, passage of black yesterday but no bowel movements. Denies abdominal pain, nausea orvomiting. Was on full liquids diet yesterday and would want her diet to be advanced  Objective: Vital signs in last 24 hours: Temp:  [96.8 F (36 C)-97.9 F (36.6 C)] 96.8 F (36 C) (03/22 1000) Pulse Rate:  [75-89] 80 (03/22 0524) Resp:  [16-18] 16 (03/22 1000) BP: (128-153)/(48-63) 153/63 (03/22 1000) SpO2:  [92 %-100 %] 100 % (03/22 1000) Weight change:  Last BM Date: 05/08/17  PE:not in distress GENERAL:appears comfortable, lying on bed, mild pallor ABDOMEN:soft, nondistended, nontender, normoactive bowel sounds EXTREMITIES:no edema, no deformity  Lab Results: Results for orders placed or performed during the hospital encounter of 05/06/17 (from the past 48 hour(s))  Glucose, capillary     Status: Abnormal   Collection Time: 05/07/17 11:48 AM  Result Value Ref Range   Glucose-Capillary 115 (H) 65 - 99 mg/dL  CBC     Status: Abnormal   Collection Time: 05/07/17  2:52 PM  Result Value Ref Range   WBC 9.5 4.0 - 10.5 K/uL   RBC 2.50 (L) 3.87 - 5.11 MIL/uL   Hemoglobin 7.7 (L) 12.0 - 15.0 g/dL   HCT 23.6 (L) 36.0 - 46.0 %   MCV 94.4 78.0 - 100.0 fL   MCH 30.8 26.0 - 34.0 pg   MCHC 32.6 30.0 - 36.0 g/dL   RDW 14.7 11.5 - 15.5 %   Platelets 219 150 - 400 K/uL    Comment: Performed at [facility], [place] [address] [place], [place] [address]  Glucose, capillary     Status: Abnormal   Collection Time: 05/07/17  4:31 PM  Result Value Ref Range   Glucose-Capillary 172 (H) 65 - 99 mg/dL  Glucose, capillary     Status: Abnormal   Collection Time: 05/07/17  9:03 PM  Result Value Ref Range   Glucose-Capillary 136 (H) 65 - 99 mg/dL  Glucose, capillary     Status: None   Collection Time: 05/07/17 11:42 PM  Result Value Ref Range   Glucose-Capillary 92 65 - 99 mg/dL   Glucose, capillary     Status: None   Collection Time: 05/08/17  4:40 AM  Result Value Ref Range   Glucose-Capillary 84 65 - 99 mg/dL  CBC     Status: Abnormal   Collection Time: 05/08/17  6:20 AM  Result Value Ref Range   WBC 7.5 4.0 - 10.5 K/uL   RBC 2.52 (L) 3.87 - 5.11 MIL/uL   Hemoglobin 7.7 (L) 12.0 - 15.0 g/dL   HCT 23.9 (L) 36.0 - 46.0 %   MCV 94.8 78.0 - 100.0 fL   MCH 30.6 26.0 - 34.0 pg   MCHC 32.2 30.0 - 36.0 g/dL   RDW 14.9 11.5 - 15.5 %   Platelets 203 150 - 400 K/uL    Comment: Performed at [facility], [place] [address], [place], [place] [address]  Basic metabolic panel     Status: Abnormal   Collection Time: 05/08/17  6:20 AM  Result Value Ref Range   Sodium 141 135 - 145 mmol/L   Potassium 4.8 3.5 - 5.1 mmol/L   Chloride 110 101 - 111 mmol/L   CO2 24 22 - 32 mmol/L   Glucose, Bld 117 (H) 65 - 99 mg/dL   BUN 16 6 - 20 mg/dL   Creatinine, Ser 1.07 (H)  0.44 - 1.00 mg/dL   Calcium 7.9 (L) 8.9 - 10.3 mg/dL   GFR calc non Af Amer 50 (L) >60 mL/min   GFR calc Af Amer 58 (L) >60 mL/min    Comment: (NOTE) The eGFR has been calculated using the CKD EPI equation. This calculation has not been validated in all clinical situations. eGFR's persistently <60 mL/min signify possible Chronic Kidney Disease.    Anion gap 7 5 - 15    Comment: Performed at [facility], [place] [address] [place], [place] [address]  Glucose, capillary     Status: Abnormal   Collection Time: 05/08/17  7:40 AM  Result Value Ref Range   Glucose-Capillary 109 (H) 65 - 99 mg/dL  Glucose, capillary     Status: Abnormal   Collection Time: 05/08/17 11:17 AM  Result Value Ref Range   Glucose-Capillary 207 (H) 65 - 99 mg/dL  Glucose, capillary     Status: Abnormal   Collection Time: 05/08/17  4:48 PM  Result Value Ref Range   Glucose-Capillary 169 (H) 65 - 99 mg/dL  CBC     Status: Abnormal   Collection Time: 05/08/17  6:59 PM  Result Value Ref Range   WBC  6.2 4.0 - 10.5 K/uL   RBC 2.36 (L) 3.87 - 5.11 MIL/uL   Hemoglobin 7.1 (L) 12.0 - 15.0 g/dL   HCT 22.4 (L) 36.0 - 46.0 %   MCV 94.9 78.0 - 100.0 fL   MCH 30.1 26.0 - 34.0 pg   MCHC 31.7 30.0 - 36.0 g/dL   RDW 15.0 11.5 - 15.5 %   Platelets 194 150 - 400 K/uL    Comment: Performed at [facility], [place] [address], [place], [place] [address]  Glucose, capillary     Status: Abnormal   Collection Time: 05/08/17  8:54 PM  Result Value Ref Range   Glucose-Capillary 159 (H) 65 - 99 mg/dL  Glucose, capillary     Status: None   Collection Time: 05/08/17 11:40 PM  Result Value Ref Range   Glucose-Capillary 94 65 - 99 mg/dL  Glucose, capillary     Status: None   Collection Time: 05/09/17  4:47 AM  Result Value Ref Range   Glucose-Capillary 94 65 - 99 mg/dL  CBC     Status: Abnormal   Collection Time: 05/09/17  5:57 AM  Result Value Ref Range   WBC 5.9 4.0 - 10.5 K/uL   RBC 2.48 (L) 3.87 - 5.11 MIL/uL   Hemoglobin 7.7 (L) 12.0 - 15.0 g/dL   HCT 23.4 (L) 36.0 - 46.0 %   MCV 94.4 78.0 - 100.0 fL   MCH 31.0 26.0 - 34.0 pg   MCHC 32.9 30.0 - 36.0 g/dL   RDW 14.9 11.5 - 15.5 %   Platelets 201 150 - 400 K/uL    Comment: Performed at [facility], [place] [address] [place], [place] [address]  Glucose, capillary     Status: None   Collection Time: 05/09/17  7:22 AM  Result Value Ref Range   Glucose-Capillary 95 65 - 99 mg/dL    Studies/Results: No results found.  Medications: I have reviewed the patient's current medications.  Assessment: Lower GI bleeding,post-polypectomy versus diverticular. Hemoglobin stable between 8.7/7.7/7.1/7.1. Hemodynamically stable   Plan: Start regular diet, okay to discharge today from GI standpoint. Patient to follow up with me in office in 2 weeks. Advised high-fiber diet, iron sulfate 325 mg,1 tablet by mouth twice a day and  MiraLAX 17 g twice a day.   Ronnette Juniper 05/09/2017, 10:45 AM   Pager [phone] If  no answer or after 5 PM call [phone]

## 2017-05-09 NOTE — Discharge Summary (Signed)
Physician Discharge Summary  Melissa Zhang  PYK:[id number]  DOB: 11-15-1940  DOA: 05/06/2017 PCP: Briscoe Deutscher, MD  Admit date: 05/06/2017 Discharge date: 05/09/2017  Admitted From: Home  Disposition: Home   Recommendations for Outpatient Follow-up:  1. Follow up with PCP in 1 week 2. Please obtain BMP/CBC in one week to monitor renal function and hemoglobin 3. Low residue diet 4. Follow-up with GI Dr. Therisa Doyne in 2 weeks 5. Iron supplement and MiraLAX twice a day  Discharge Condition: Stable CODE STATUS: Full code Diet recommendation: Heart Healthy  Brief/Interim Summary: For full details see H&P/Progress note, but in brief, Melissa Zhang is a 77 year old female with medical history of OSA, diabetes mellitus type 2, hypertension, had a recent colonoscopy with polypectomies 6 days prior to admission.  She presented to the emergency department complaining of bloody stools with clots.  patient was admitted with working diagnosis of possible post polypectomy bleed versus diverticular bleed.  Patient underwent colonoscopy, bleeding site was not identified but patient does have extensive left side diverticulosis.  Hemoglobin was monitored.  Patient subsequently improved but did not have any more bloody bowel movement.  Tolerating diet will.  GI cleared for discharge.  Patient will be discharged home with iron supplement, low residue diet and follow-up with PCP.   Subjective: Seen and examined, she has no new complaints is asking for regular diet.  No acute events overnight.  Denies chest pain, shortness of breath, dizziness and palpitation.  Discharge Diagnoses/Hospital Course:  Lower GI bleed Suspect post polypectomy versus diverticular bleed Hemoglobin was monitored and stayed stable at 7.7. Holding aspirin for now until discussed with PCP Follow-up with GI in 2 weeks Low residue diet and MiraLAX daily Check CBC in 1 week.  Acute blood loss anemia from GI bleed Iron supplementation  twice daily Monitoring 1 week  Diabetes mellitus type 2 CBGs stable Continue home management with no change in medication  Hypertension BP stable, antihypertensives were held due to near syncope episode in the emergency department Subsequently blood pressure was stable during hospital stay Continue home medications without any changes  All other chronic medical condition were stable during the hospitalization.  On the day of the discharge the patient's vitals were stable, and no other acute medical condition were reported by patient. the patient was felt safe to be discharge to home.   Discharge Instructions  You were cared for by a hospitalist during your hospital stay. If you have any questions about your discharge medications or the care you received while you were in the hospital after you are discharged, you can call the unit and asked to speak with the hospitalist on call if the hospitalist that took care of you is not available. Once you are discharged, your primary care physician will handle any further medical issues. Please note that NO REFILLS for any discharge medications will be authorized once you are discharged, as it is imperative that you return to your primary care physician (or establish a relationship with a primary care physician if you do not have one) for your aftercare needs so that they can reassess your need for medications and monitor your lab values.  Discharge Instructions    Call MD for:  difficulty breathing, headache or visual disturbances   Complete by:  As directed    Call MD for:  persistant nausea and vomiting   Complete by:  As directed    Call MD for:  redness, tenderness, or signs of infection (pain, swelling,  redness, odor or green/yellow discharge around incision site)   Complete by:  As directed    Call MD for:  severe uncontrolled pain   Complete by:  As directed    Call MD for:  temperature >100.4   Complete by:  As directed    Diet - low  sodium heart healthy   Complete by:  As directed    Increase activity slowly   Complete by:  As directed      Allergies as of 05/09/2017      Reactions   Lisinopril Anaphylaxis, Swelling   TONGUE SWELLING   Actos [pioglitazone] Swelling   UNSPECIFIED    Losartan Other (See Comments)   Made potassium levels go up CAUTION: SEVERE REACTION TO ACE-I ? ANAPHYLAXIS ?   Ciprofloxacin Nausea Only      Medication List    TAKE these medications   aspirin 81 MG tablet Take 81 mg by mouth daily.   bisoprolol 5 MG tablet Commonly known as:  ZEBETA Take 5 mg by mouth daily.   DULoxetine 60 MG capsule Commonly known as:  CYMBALTA Take 60 mg by mouth daily.   gabapentin 300 MG capsule Commonly known as:  NEURONTIN Take 300 mg by mouth at bedtime.   glimepiride 4 MG tablet Commonly known as:  AMARYL Take 4 mg by mouth 2 (two) times daily.   hydrochlorothiazide 12.5 MG capsule Commonly known as:  MICROZIDE Take 1 capsule (12.5 mg total) by mouth daily.   iron polysaccharides 150 MG capsule Commonly known as:  NIFEREX Take 1 capsule (150 mg total) by mouth daily.   LANTUS SOLOSTAR 100 UNIT/ML Solostar Pen Generic drug:  Insulin Glargine Inject 60 Units into the skin at bedtime.   levothyroxine 50 MCG tablet Commonly known as:  SYNTHROID, LEVOTHROID Take 50 mcg by mouth daily.   lovastatin 40 MG tablet Commonly known as:  MEVACOR Take 40 mg by mouth at bedtime.   metFORMIN 1000 MG tablet Commonly known as:  GLUCOPHAGE Take 1,000 mg by mouth 2 (two) times daily.   polyethylene glycol packet Commonly known as:  MIRALAX / GLYCOLAX Take 17 g by mouth 2 (two) times daily.      Follow-up Information    Briscoe Deutscher, MD. Schedule an appointment as soon as possible for a visit in 1 week(s).   Specialty:  Family Medicine Why:  Hospital follow-up Contact information: [place] [address] [phone]        Ronnette Juniper, MD. Schedule an appointment as  soon as possible for a visit in 2 week(s).   Specialty:  Gastroenterology Why:  Hospital follow-up Contact information: [address] [place] [address] [phone]          Allergies  Allergen Reactions  . Lisinopril Anaphylaxis and Swelling    TONGUE SWELLING  . Actos [Pioglitazone] Swelling    UNSPECIFIED   . Losartan Other (See Comments)    Made potassium levels go up CAUTION: SEVERE REACTION TO ACE-I ? ANAPHYLAXIS ?  Marland Kitchen Ciprofloxacin Nausea Only    Consultations:   Procedures/Studies: No results found.    Discharge Exam: Vitals:   05/09/17 0524 05/09/17 1000  BP: (!) 131/59 (!) 153/63  Pulse: 80   Resp: 18 16  Temp: (!) 97.5 F (36.4 C) (!) 96.8 F (36 C)  SpO2: 92% 100%   Vitals:   05/08/17 1347 05/08/17 2019 05/09/17 0524 05/09/17 1000  BP: (!) 128/48 (!) 134/50 (!) 131/59 Marland Kitchen)  153/63  Pulse: 75 89 80   Resp: 18 18 18 16   Temp: 97.7 F (36.5 C) 97.9 F (36.6 C) (!) 97.5 F (36.4 C) (!) 96.8 F (36 C)  TempSrc: Oral Oral Oral Oral  SpO2: 100% 99% 92% 100%  Weight:      Height:        General: Pt is alert, awake, not in acute distress Cardiovascular: RRR, S1/S2 +, no rubs, no gallops Respiratory: CTA bilaterally, no wheezing, no rhonchi Abdominal: Soft, NT, ND, bowel sounds + Extremities: no edema   The results of significant diagnostics from this hospitalization (including imaging, microbiology, ancillary and laboratory) are listed below for reference.     Microbiology: No results found for this or any previous visit (from the past 240 hour(s)).   Labs: BNP (last 3 results) No results for input(s): BNP in the last 8760 hours. Basic Metabolic Panel: Recent Labs  Lab 05/06/17 2345 05/07/17 0625 05/08/17 0620  NA 136 139 141  K 5.2* 5.1 4.8  CL 105 107 110  CO2 22 24 24   GLUCOSE 307* 184* 117*  BUN 22* 26* 16  CREATININE 1.29* 1.19* 1.07*  CALCIUM 8.5* 7.9* 7.9*   Liver Function Tests: Recent Labs  Lab  05/06/17 2345  AST 20  ALT 10*  ALKPHOS 80  BILITOT 0.2*  PROT 6.3*  ALBUMIN 3.2*   No results for input(s): LIPASE, AMYLASE in the last 168 hours. No results for input(s): AMMONIA in the last 168 hours. CBC: Recent Labs  Lab 05/07/17 0625 05/07/17 1452 05/08/17 0620 05/08/17 1859 05/09/17 0557  WBC 9.2 9.5 7.5 6.2 5.9  HGB 8.3* 7.7* 7.7* 7.1* 7.7*  HCT 26.3* 23.6* 23.9* 22.4* 23.4*  MCV 94.9 94.4 94.8 94.9 94.4  PLT 234 219 203 194 201   Cardiac Enzymes: Recent Labs  Lab 05/06/17 2354  TROPONINI 0.04*   BNP: Invalid input(s): POCBNP CBG: Recent Labs  Lab 05/08/17 2054 05/08/17 2340 05/09/17 0447 05/09/17 0722 05/09/17 1130  GLUCAP 159* 94 94 95 146*   D-Dimer No results for input(s): DDIMER in the last 72 hours. Hgb A1c No results for input(s): HGBA1C in the last 72 hours. Lipid Profile No results for input(s): CHOL, HDL, LDLCALC, TRIG, CHOLHDL, LDLDIRECT in the last 72 hours. Thyroid function studies No results for input(s): TSH, T4TOTAL, T3FREE, THYROIDAB in the last 72 hours.  Invalid input(s): FREET3 Anemia work up No results for input(s): VITAMINB12, FOLATE, FERRITIN, TIBC, IRON, RETICCTPCT in the last 72 hours. Urinalysis No results found for: COLORURINE, APPEARANCEUR, LABSPEC, [place], GLUCOSEU, HGBUR, BILIRUBINUR, KETONESUR, PROTEINUR, UROBILINOGEN, NITRITE, LEUKOCYTESUR Sepsis Labs Invalid input(s): PROCALCITONIN,  WBC,  LACTICIDVEN Microbiology No results found for this or any previous visit (from the past 240 hour(s)).   Time coordinating discharge:  35 minutes  SIGNED:  Chipper Oman, MD  [facility] 05/09/2017, 12:39 PM  Pager please text page via  [email or web address]  Note - This record has been created using [employer]. Chart creation errors have been sought, but may not always have been located. Such creation errors do not reflect on the standard of medical care.

## 2017-05-13 DIAGNOSIS — D500 Iron deficiency anemia secondary to blood loss (chronic): Secondary | ICD-10-CM | POA: Diagnosis not present

## 2017-05-13 DIAGNOSIS — Z794 Long term (current) use of insulin: Secondary | ICD-10-CM | POA: Diagnosis not present

## 2017-05-13 DIAGNOSIS — E1140 Type 2 diabetes mellitus with diabetic neuropathy, unspecified: Secondary | ICD-10-CM | POA: Diagnosis not present

## 2017-05-13 DIAGNOSIS — K922 Gastrointestinal hemorrhage, unspecified: Secondary | ICD-10-CM | POA: Diagnosis not present

## 2017-06-02 DIAGNOSIS — D126 Benign neoplasm of colon, unspecified: Secondary | ICD-10-CM | POA: Diagnosis not present

## 2017-06-02 DIAGNOSIS — D509 Iron deficiency anemia, unspecified: Secondary | ICD-10-CM | POA: Diagnosis not present

## 2017-06-13 ENCOUNTER — Ambulatory Visit: Payer: Medicare Other | Admitting: Cardiovascular Disease

## 2017-08-01 DIAGNOSIS — D509 Iron deficiency anemia, unspecified: Secondary | ICD-10-CM | POA: Diagnosis not present

## 2017-09-04 ENCOUNTER — Ambulatory Visit: Payer: Medicare Other | Admitting: Cardiovascular Disease

## 2017-10-13 DIAGNOSIS — H40033 Anatomical narrow angle, bilateral: Secondary | ICD-10-CM | POA: Diagnosis not present

## 2017-10-13 DIAGNOSIS — H04123 Dry eye syndrome of bilateral lacrimal glands: Secondary | ICD-10-CM | POA: Diagnosis not present

## 2017-11-20 ENCOUNTER — Encounter: Payer: Self-pay | Admitting: Cardiovascular Disease

## 2017-11-20 ENCOUNTER — Ambulatory Visit (INDEPENDENT_AMBULATORY_CARE_PROVIDER_SITE_OTHER): Payer: Medicare Other | Admitting: Cardiovascular Disease

## 2017-11-20 ENCOUNTER — Ambulatory Visit: Payer: Medicare Other | Admitting: Cardiovascular Disease

## 2017-11-20 VITALS — BP 142/70 | HR 71 | Ht 62.5 in | Wt 255.0 lb

## 2017-11-20 DIAGNOSIS — G4733 Obstructive sleep apnea (adult) (pediatric): Secondary | ICD-10-CM | POA: Diagnosis not present

## 2017-11-20 DIAGNOSIS — I340 Nonrheumatic mitral (valve) insufficiency: Secondary | ICD-10-CM

## 2017-11-20 DIAGNOSIS — Q211 Atrial septal defect, unspecified: Secondary | ICD-10-CM

## 2017-11-20 DIAGNOSIS — R600 Localized edema: Secondary | ICD-10-CM

## 2017-11-20 DIAGNOSIS — I519 Heart disease, unspecified: Secondary | ICD-10-CM

## 2017-11-20 DIAGNOSIS — I38 Endocarditis, valve unspecified: Secondary | ICD-10-CM

## 2017-11-20 DIAGNOSIS — R0609 Other forms of dyspnea: Secondary | ICD-10-CM | POA: Diagnosis not present

## 2017-11-20 NOTE — Progress Notes (Signed)
SUBJECTIVE: The patient presents for routine follow-up.  She has valvular heart disease.  She is also obese and has sleep apnea.  Echocardiogram on 09/11/2016 showed normal left ventricular systolic function, LVEF 50 to 55%, mild LVH, grade 2 diastolic dysfunction, moderate, eccentric mitral regurgitation, calcified aortic annulus with trivial aortic regurgitation, and mild tricuspid regurgitation with PASP 38 mmHg.  There was a PFO versus secundum ASD with left-to-right shunt.  Right ventricular systolic function was normal.  She refuses CPAP.  She has chronic exertional dyspnea which is stable.  She has some fatigue which has not changed in several years.  She denies chest pain and tightness.  She denies palpitations, orthopnea, and paroxysmal nocturnal dyspnea.  She has been married 40 years.  Her husband continues to drive a truck and has done so for 45 years.      Review of Systems: As per "subjective", otherwise negative.  Allergies  Allergen Reactions  . Lisinopril Anaphylaxis and Swelling    TONGUE SWELLING  . Actos [Pioglitazone] Swelling    UNSPECIFIED   . Losartan Other (See Comments)    Made potassium levels go up CAUTION: SEVERE REACTION TO ACE-I ? ANAPHYLAXIS ?  Marland Kitchen Ciprofloxacin Nausea Only    Current Outpatient Medications  Medication Sig Dispense Refill  . bisoprolol (ZEBETA) 5 MG tablet Take 5 mg by mouth daily.  3  . DULoxetine (CYMBALTA) 60 MG capsule Take 60 mg by mouth daily.    Marland Kitchen gabapentin (NEURONTIN) 300 MG capsule Take 300 mg by mouth at bedtime.    Marland Kitchen glimepiride (AMARYL) 4 MG tablet Take 4 mg by mouth 2 (two) times daily.    . hydrochlorothiazide (MICROZIDE) 12.5 MG capsule Take 1 capsule (12.5 mg total) by mouth daily. 90 capsule 3  . iron polysaccharides (NIFEREX) 150 MG capsule Take 1 capsule (150 mg total) by mouth daily. 30 capsule 0  . LANTUS SOLOSTAR 100 UNIT/ML Solostar Pen Inject 60 Units into the skin at bedtime.     Marland Kitchen levothyroxine  (SYNTHROID, LEVOTHROID) 50 MCG tablet Take 50 mcg by mouth daily.    Marland Kitchen lovastatin (MEVACOR) 40 MG tablet Take 40 mg by mouth at bedtime.    . metFORMIN (GLUCOPHAGE) 1000 MG tablet Take 1,000 mg by mouth 2 (two) times daily.    . polyethylene glycol (MIRALAX / GLYCOLAX) packet Take 17 g by mouth 2 (two) times daily. 14 each 0   No current facility-administered medications for this visit.     Past Medical History:  Diagnosis Date  . Allergic rhinitis   . Esophageal reflux   . Hypertension   . Low back pain   . Mixed hyperlipidemia   . Obstructive sleep apnea   . Peripheral neuropathy   . Pneumonia    history  . Type 2 diabetes mellitus ([place])     Past Surgical History:  Procedure Laterality Date  . ABDOMINAL HYSTERECTOMY    . CHOLECYSTECTOMY    . COLONOSCOPY    . COLONOSCOPY WITH PROPOFOL N/A 05/07/2017   Procedure: COLONOSCOPY WITH PROPOFOL;  Surgeon: Ronald Lobo, MD;  Location: [facility];  Service: Endoscopy;  Laterality: N/A;  . CYSTECTOMY     LEFT BREAST  . EYE SURGERY Bilateral    caratacts  . REPLACEMENT TOTAL KNEE BILATERAL    . REVERSE SHOULDER ARTHROPLASTY Right 09/29/2015   Procedure: RIGHT REVERSE TOTAL SHOULDER ARTHROPLASTY;  Surgeon: Netta Cedars, MD;  Location: [facility];  Service: Orthopedics;  Laterality: Right;    Social  History   Socioeconomic History  . Marital status: Married    Spouse name: Not on file  . Number of children: Not on file  . Years of education: Not on file  . Highest education level: Not on file  Occupational History  . Not on file  Social Needs  . Financial resource strain: Not on file  . Food insecurity:    Worry: Not on file    Inability: Not on file  . Transportation needs:    Medical: Not on file    Non-medical: Not on file  Tobacco Use  . Smoking status: Never Smoker  . Smokeless tobacco: Never Used  Substance and Sexual Activity  . Alcohol use: No    Alcohol/week: 0.0 standard drinks  . Drug use: No  . Sexual  activity: Not on file  Lifestyle  . Physical activity:    Days per week: Not on file    Minutes per session: Not on file  . Stress: Not on file  Relationships  . Social connections:    Talks on phone: Not on file    Gets together: Not on file    Attends religious service: Not on file    Active member of club or organization: Not on file    Attends meetings of clubs or organizations: Not on file    Relationship status: Not on file  . Intimate partner violence:    Fear of current or ex partner: Not on file    Emotionally abused: Not on file    Physically abused: Not on file    Forced sexual activity: Not on file  Other Topics Concern  . Not on file  Social History Narrative  . Not on file     Vitals:   11/20/17 1338  BP: (!) 142/70  Pulse: 71  SpO2: 98%  Weight: 255 lb (115.7 kg)  Height: 5' 2.5" (1.588 m)    Wt Readings from Last 3 Encounters:  11/20/17 255 lb (115.7 kg)  05/07/17 262 lb 9.6 oz (119.1 kg)  06/13/16 254 lb (115.2 kg)     PHYSICAL EXAM General: NAD HEENT: Normal. Neck: No JVD, no thyromegaly. Lungs: Clear to auscultation bilaterally with normal respiratory effort. CV: Regular rate and rhythm, normal S1/S2, no N2/T5, soft systolic murmur along LSB.  Trace bilateral lower extremity edema.  No carotid bruit.   Abdomen: Soft, nontender, obese.  Neurologic: Alert and oriented.  Psych: Normal affect. Skin: Normal. Musculoskeletal: No gross deformities.    ECG: Reviewed above under Subjective   Labs: Lab Results  Component Value Date/Time   K 4.8 05/08/2017 06:20 AM   BUN 16 05/08/2017 06:20 AM   CREATININE 1.07 (H) 05/08/2017 06:20 AM   ALT 10 (L) 05/06/2017 11:45 PM   HGB 7.7 (L) 05/09/2017 05:57 AM     Lipids: No results found for: LDLCALC, LDLDIRECT, CHOL, TRIG, HDL     ASSESSMENT AND PLAN:  1. Dyspnea on exertion: Symptomatically stable. Mitral regurgitation is moderate in severity. She also has mild tricuspid regurgitation with  mildly elevated pulmonary pressures, likely being exacerbated by untreated sleep apnea. She refuses CPAP. Nuclear stress testing was unremarkable.   2. Chest pain: Nuclear stress test on 06/15/13 did not demonstrate any suggestion of occult ischemic heart disease. No recurrence of symptoms.  3. Grade II diastolic dysfunction: No signs of diastolic decompensation with mildly elevated blood pressure.  4. Small secundum ASD: This appears to be small and is not likely to be causing significant hemodynamic  problems. If her dyspnea on exertion were to increase in frequency or severity, I would consider right heart catheterization and shunt quantification versus cardiac MRI.  5. Valvular heart disease: Moderate mitral regurgitation and mild tricuspid regurgitation by echocardiogram as detailed above.  6. Sleep apnea: She continues to refuse CPAP.  7. Leg edema: Controlled. No changes to therapy.  8. Essential HTN: Mildly elevated. No changes.   Disposition: Follow up 1 year   Kate Sable, M.D., F.A.C.C.

## 2017-11-20 NOTE — Patient Instructions (Signed)
Your physician wants you to follow-up in: 1 YEAR WITH DR KONESWARAN You will receive a reminder letter in the mail two months in advance. If you don't receive a letter, please call our office to schedule the follow-up appointment.  Your physician recommends that you continue on your current medications as directed. Please refer to the Current Medication list given to you today.  Thank you for choosing [facility]!!    

## 2018-05-21 IMAGING — CT CT RENAL STONE PROTOCOL
2 of 4 series · 16 of 46 positions shown, 18 images · non-contrast
Comparison: None.

CLINICAL DATA: Right flank pain for 3 weeks radiating to the back

EXAM:
CT ABDOMEN AND PELVIS WITHOUT CONTRAST
TECHNIQUE: Multidetector CT imaging of the abdomen and pelvis was performed
following the standard protocol without IV contrast.

[Series 2: axial st · axial · 0.89mm/px · z∈[+772,+1182]mm · 13 of 90 slices shown, 15 images]
[im 4/90  soft-tissue]
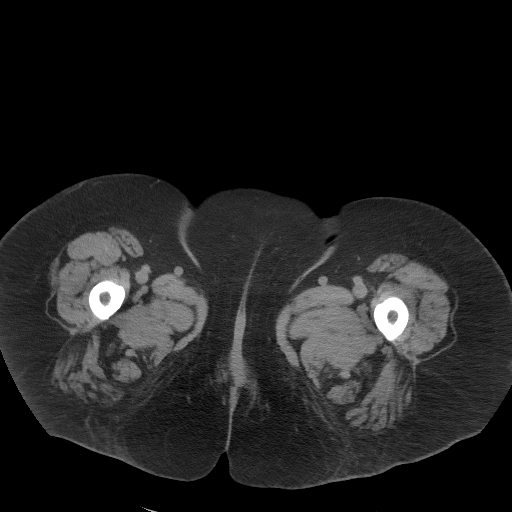
[im 4/90  bone]
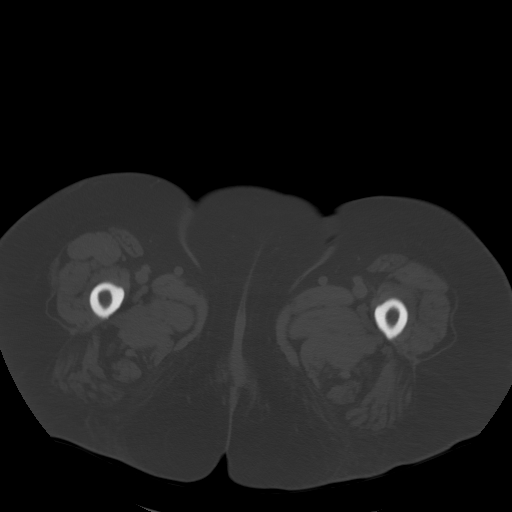
[im 12/90  soft-tissue]
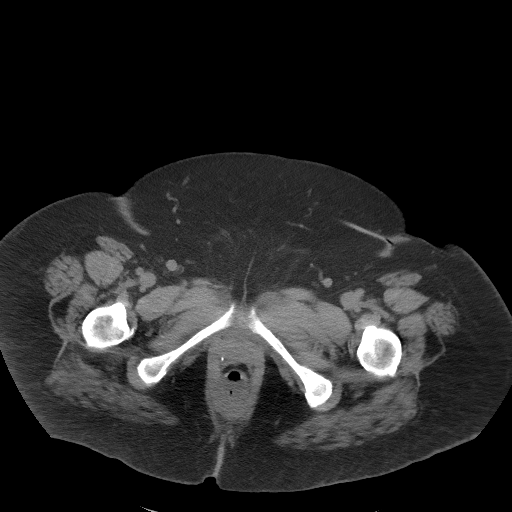
[im 19/90  soft-tissue]
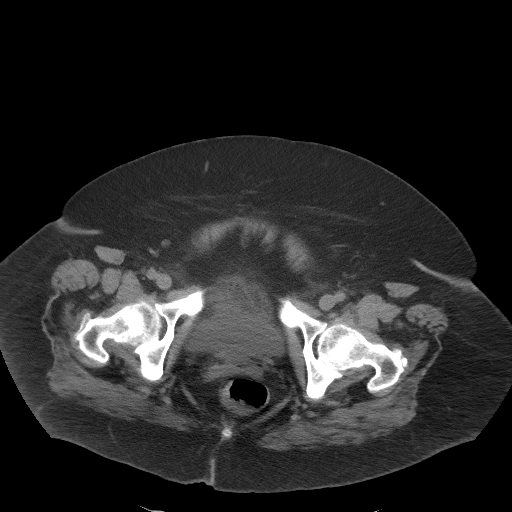
[im 26/90  soft-tissue]
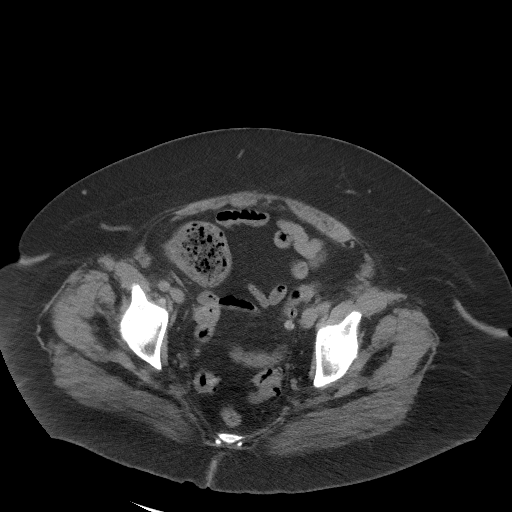
[im 30/90  soft-tissue]
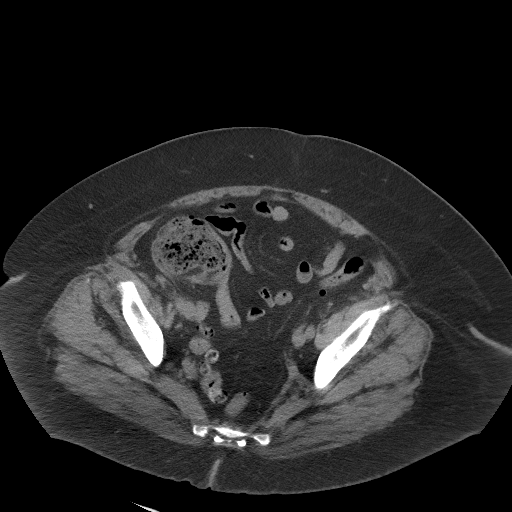
[im 38/90  soft-tissue]
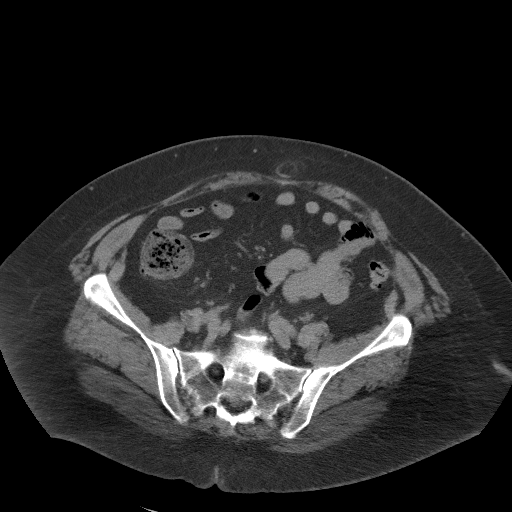
[im 45/90  soft-tissue]
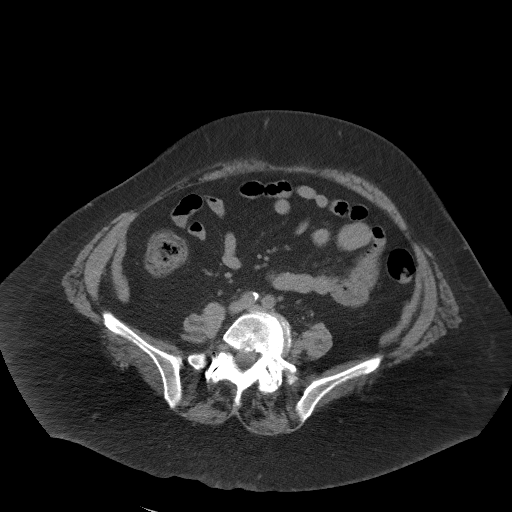
[im 52/90  soft-tissue]
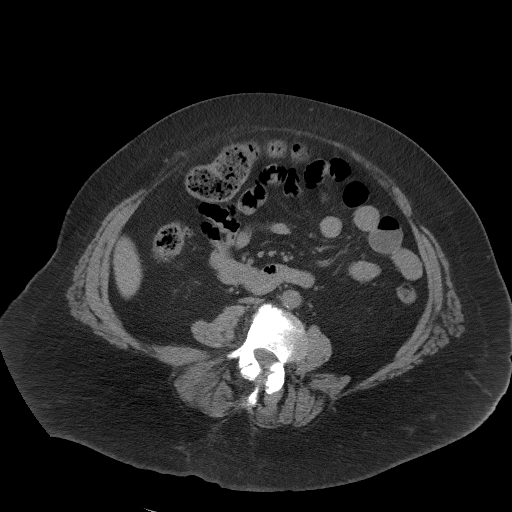
[im 60/90  soft-tissue]
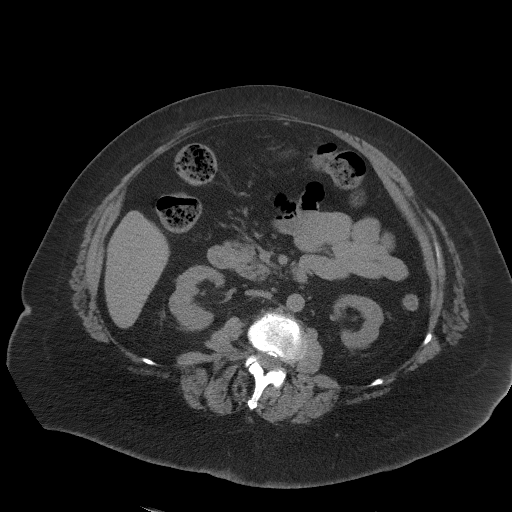
[im 60/90  bone]
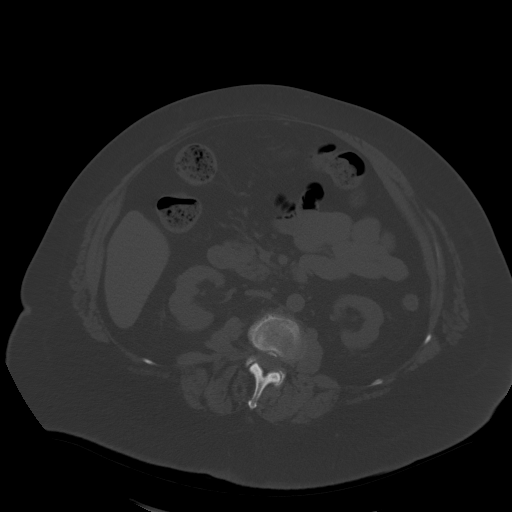
[im 64/90  soft-tissue]
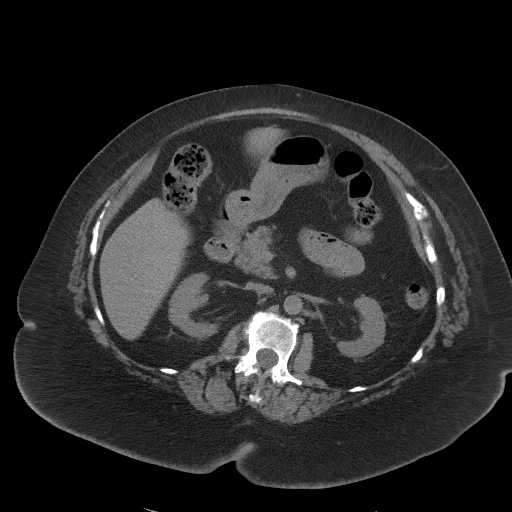
[im 71/90  soft-tissue]
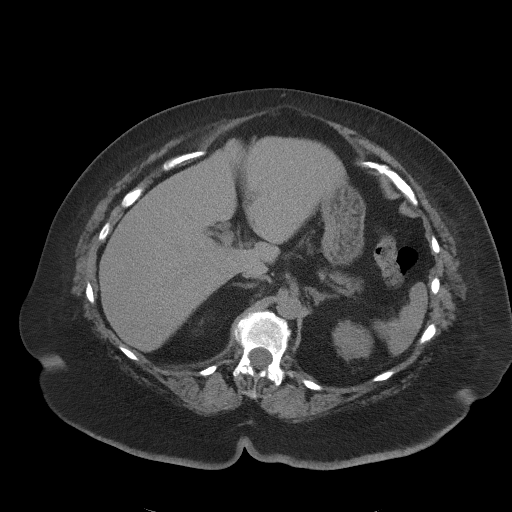
[im 78/90  soft-tissue]
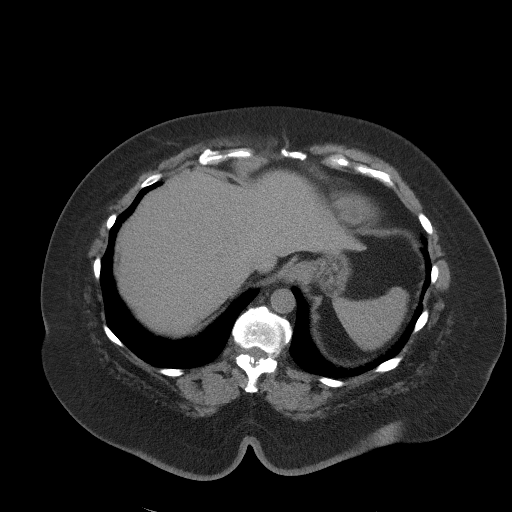
[im 86/90  soft-tissue]
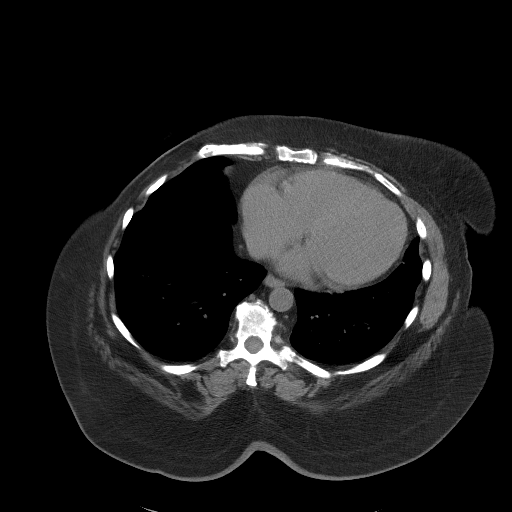

[Series 5: coronal st · coronal · 0.91mm/px · 3 of 101 slices shown]
[im 34/101  soft-tissue]
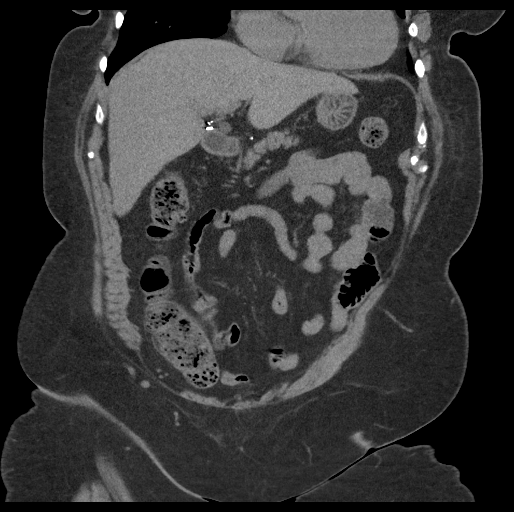
[im 45/101  soft-tissue]
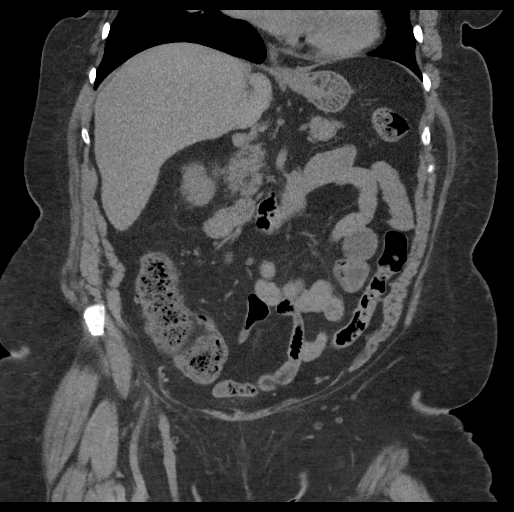
[im 56/101  soft-tissue]
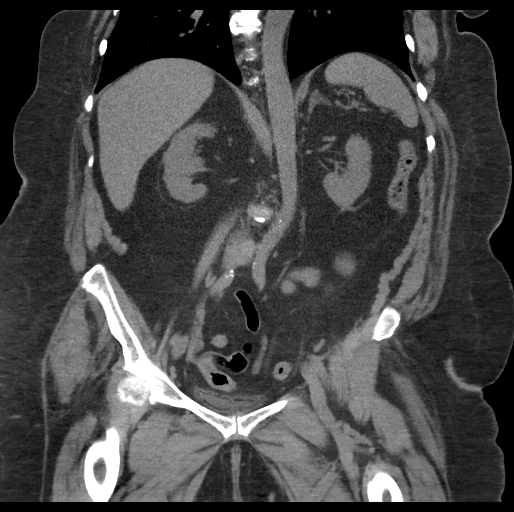

[16 of 46 positions shown; findings below may reference images not displayed]

FINDINGS: Lower chest: The lung bases are clear. The heart is mildly enlarged.
No pericardial effusion is seen.

Hepatobiliary: The liver is unremarkable in the unenhanced state.
Surgical clips are present from prior cholecystectomy.

Pancreas: The pancreas is normal in size and the pancreatic duct is
not dilated.

Spleen: The spleen is unremarkable.

Adrenals/Urinary Tract: The adrenal glands are unremarkable. The
kidneys appear normal in the unenhanced state. No renal calculi are
seen and there is no hydronephrosis noted. Minimal bulging of the
lower pole of the right kidney is noted on coronal images of
questionable significance. Ultrasound may be helpful to assess
kidneys further warranted. The ureters appear normal in caliber. The
urinary bladder is decompressed without definite abnormality.

Stomach/Bowel: The stomach is largely decompressed and difficult to
assess. No small bowel distention is seen. Multiple rectosigmoid
colon diverticula are present but no diverticulitis is noted. There
is feces throughout the colon. The terminal ileum is unremarkable.
The appendix is not visualized.

Vascular/Lymphatic: The abdominal aorta is normal in caliber with
mild abdominal aortic atherosclerosis present. No adenopathy is
seen.

Reproductive: The uterus has previously been resected. No adnexal
lesion is seen. No fluid is noted within the pelvis.

Other: No abdominal wall hernia is seen.

Musculoskeletal: There is lumbar scoliosis convex to the left with
degenerative disc disease primarily at L3-4 and L5-S1 levels. No
compression deformity is seen.
IMPRESSION: 1. No explanation for the patient's right flank pain is seen. No
renal or ureteral calculi are noted and there is no evidence of
hydronephrosis.
2. Multiple rectosigmoid colon diverticula.  No diverticulitis.
3. Minimal bulging of the lower pole of the right kidney on this
unenhanced study. Consider ultrasound for further assessment if
warranted.
4. Mild abdominal aortic atherosclerosis.
5. Lumbar scoliosis with degenerative disc disease.

## 2018-11-30 ENCOUNTER — Other Ambulatory Visit: Payer: Self-pay

## 2018-11-30 ENCOUNTER — Ambulatory Visit: Payer: Medicare Other | Admitting: Cardiovascular Disease

## 2018-11-30 ENCOUNTER — Encounter: Payer: Self-pay | Admitting: Cardiovascular Disease

## 2018-11-30 VITALS — BP 142/83 | HR 69 | Ht 62.5 in | Wt 253.2 lb

## 2018-11-30 DIAGNOSIS — G4733 Obstructive sleep apnea (adult) (pediatric): Secondary | ICD-10-CM

## 2018-11-30 DIAGNOSIS — Q211 Atrial septal defect, unspecified: Secondary | ICD-10-CM

## 2018-11-30 DIAGNOSIS — I340 Nonrheumatic mitral (valve) insufficiency: Secondary | ICD-10-CM

## 2018-11-30 DIAGNOSIS — R600 Localized edema: Secondary | ICD-10-CM

## 2018-11-30 DIAGNOSIS — R0600 Dyspnea, unspecified: Secondary | ICD-10-CM | POA: Diagnosis not present

## 2018-11-30 DIAGNOSIS — R0609 Other forms of dyspnea: Secondary | ICD-10-CM

## 2018-11-30 DIAGNOSIS — I519 Heart disease, unspecified: Secondary | ICD-10-CM | POA: Diagnosis not present

## 2018-11-30 DIAGNOSIS — I10 Essential (primary) hypertension: Secondary | ICD-10-CM | POA: Diagnosis not present

## 2018-11-30 DIAGNOSIS — I38 Endocarditis, valve unspecified: Secondary | ICD-10-CM | POA: Diagnosis not present

## 2018-11-30 NOTE — Progress Notes (Signed)
SUBJECTIVE: The patient presents for routine follow-up.  She has valvular heart disease.  She is also obese and has sleep apnea.  Echocardiogram on 09/11/2016 showed normal left ventricular systolic function, LVEF 50 to 55%, mild LVH, grade 2 diastolic dysfunction, moderate, eccentric mitral regurgitation, calcified aortic annulus with trivial aortic regurgitation, and mild tricuspid regurgitation with PASP 38 mmHg.  There was a PFO versus secundum ASD with left-to-right shunt.  Right ventricular systolic function was normal.  She refuses CPAP.  She has chronic exertional dyspnea which is stable.  She has some fatigue which has not changed in several years.  She denies chest pain and tightness.  She denies palpitations, orthopnea, and paroxysmal nocturnal dyspnea.  She has been married 24 years.  Her husband continues to drive a truck and has done so for 46 years.  He typically drives to [place] once a week.  ECG performed today which I personally reviewed demonstrates sinus rhythm with LVH and repolarization abnormalities.    Review of Systems: As per "subjective", otherwise negative.  Allergies  Allergen Reactions  . Lisinopril Anaphylaxis and Swelling    TONGUE SWELLING  . Actos [Pioglitazone] Swelling    UNSPECIFIED   . Losartan Other (See Comments)    Made potassium levels go up CAUTION: SEVERE REACTION TO ACE-I ? ANAPHYLAXIS ?  Marland Kitchen Ciprofloxacin Nausea Only    Current Outpatient Medications  Medication Sig Dispense Refill  . bisoprolol (ZEBETA) 5 MG tablet Take 5 mg by mouth daily.  3  . DULoxetine (CYMBALTA) 60 MG capsule Take 60 mg by mouth daily.    Marland Kitchen gabapentin (NEURONTIN) 300 MG capsule Take 300 mg by mouth at bedtime.    Marland Kitchen glimepiride (AMARYL) 4 MG tablet Take 4 mg by mouth 2 (two) times daily.    . hydrochlorothiazide (MICROZIDE) 12.5 MG capsule Take 1 capsule (12.5 mg total) by mouth daily. 90 capsule 3  . LANTUS SOLOSTAR 100 UNIT/ML Solostar Pen Inject  60 Units into the skin at bedtime.     Marland Kitchen levothyroxine (SYNTHROID, LEVOTHROID) 50 MCG tablet Take 50 mcg by mouth daily.    Marland Kitchen lovastatin (MEVACOR) 40 MG tablet Take 40 mg by mouth at bedtime.    . metFORMIN (GLUCOPHAGE) 1000 MG tablet Take 1,000 mg by mouth 2 (two) times daily.     No current facility-administered medications for this visit.     Past Medical History:  Diagnosis Date  . Allergic rhinitis   . Esophageal reflux   . Hypertension   . Low back pain   . Mixed hyperlipidemia   . Obstructive sleep apnea   . Peripheral neuropathy   . Pneumonia    history  . Type 2 diabetes mellitus ([place])     Past Surgical History:  Procedure Laterality Date  . ABDOMINAL HYSTERECTOMY    . CHOLECYSTECTOMY    . COLONOSCOPY    . COLONOSCOPY WITH PROPOFOL N/A 05/07/2017   Procedure: COLONOSCOPY WITH PROPOFOL;  Surgeon: Ronald Lobo, MD;  Location: [facility];  Service: Endoscopy;  Laterality: N/A;  . CYSTECTOMY     LEFT BREAST  . EYE SURGERY Bilateral    caratacts  . REPLACEMENT TOTAL KNEE BILATERAL    . REVERSE SHOULDER ARTHROPLASTY Right 09/29/2015   Procedure: RIGHT REVERSE TOTAL SHOULDER ARTHROPLASTY;  Surgeon: Netta Cedars, MD;  Location: [facility];  Service: Orthopedics;  Laterality: Right;    Social History   Socioeconomic History  . Marital status: Married    Spouse name: Not on  file  . Number of children: Not on file  . Years of education: Not on file  . Highest education level: Not on file  Occupational History  . Not on file  Social Needs  . Financial resource strain: Not on file  . Food insecurity    Worry: Not on file    Inability: Not on file  . Transportation needs    Medical: Not on file    Non-medical: Not on file  Tobacco Use  . Smoking status: Never Smoker  . Smokeless tobacco: Never Used  Substance and Sexual Activity  . Alcohol use: No    Alcohol/week: 0.0 standard drinks  . Drug use: No  . Sexual activity: Not on file  Lifestyle  . Physical  activity    Days per week: Not on file    Minutes per session: Not on file  . Stress: Not on file  Relationships  . Social Herbalist on phone: Not on file    Gets together: Not on file    Attends religious service: Not on file    Active member of club or organization: Not on file    Attends meetings of clubs or organizations: Not on file    Relationship status: Not on file  . Intimate partner violence    Fear of current or ex partner: Not on file    Emotionally abused: Not on file    Physically abused: Not on file    Forced sexual activity: Not on file  Other Topics Concern  . Not on file  Social History Narrative  . Not on file     Vitals:   11/30/18 1503  BP: (!) 142/83  Pulse: 69  SpO2: 97%  Weight: 253 lb 3.2 oz (114.9 kg)  Height: 5' 2.5" (1.588 m)    Wt Readings from Last 3 Encounters:  11/30/18 253 lb 3.2 oz (114.9 kg)  11/20/17 255 lb (115.7 kg)  05/07/17 262 lb 9.6 oz (119.1 kg)     PHYSICAL EXAM General: NAD HEENT: Normal. Neck: No JVD, no thyromegaly. Lungs: Clear to auscultation bilaterally with normal respiratory effort. CV: Regular rate and rhythm, normal S1/S2, no [id number], soft systolicmurmur along LSB.  Trace bilateral lower extremity edema. No carotid bruit.   Abdomen: Soft, nontender, no distention.  Neurologic: Alert and oriented.  Psych: Normal affect. Skin: Normal. Musculoskeletal: No gross deformities.      Labs: Lab Results  Component Value Date/Time   K 4.8 05/08/2017 06:20 AM   BUN 16 05/08/2017 06:20 AM   CREATININE 1.07 (H) 05/08/2017 06:20 AM   ALT 10 (L) 05/06/2017 11:45 PM   HGB 7.7 (L) 05/09/2017 05:57 AM     Lipids: No results found for: LDLCALC, LDLDIRECT, CHOL, TRIG, HDL     ASSESSMENT AND PLAN: 1. Dyspnea on exertion:Symptomatically stable. Mitral regurgitation is moderate in severity. She also has mild tricuspid regurgitation with mildly elevated pulmonary pressures, likely being exacerbated by  untreated sleep apnea. She refuses CPAP. Nuclear stress testing was unremarkable.   2. Chest pain:Nuclear stress test on 06/15/13 did not demonstrate any suggestion of occult ischemic heart disease. No recurrence of symptoms.  3. Grade II diastolic dysfunction:No signs of diastolic decompensation with mildly elevated blood pressure.  4. Small secundum [id number] appears to be small and is not likely to be causing significant hemodynamic problems. If her dyspnea on exertion were to increase in frequency or severity, I would consider right heart catheterization and shunt quantification versus cardiac  MRI.  5. Valvular heart disease:Moderate mitral regurgitation and mild tricuspid regurgitation by echocardiogram as detailed above.  6. Sleep apnea:She continues to refuse CPAP.  7. Leg edema:Controlled. No changes to therapy.  8. Essential [id number] elevated. No changes.     Disposition: Follow up 1 year   Kate Sable, M.D., F.A.C.C.

## 2018-11-30 NOTE — Patient Instructions (Signed)

## 2019-04-25 ENCOUNTER — Ambulatory Visit: Payer: Medicare HMO | Attending: Internal Medicine

## 2019-04-25 DIAGNOSIS — Z23 Encounter for immunization: Secondary | ICD-10-CM | POA: Insufficient documentation

## 2019-04-25 NOTE — Progress Notes (Signed)
   Covid-19 Vaccination Clinic  Name:  Melissa Zhang    MRN: [id number] DOB: [birth year]  04/25/2019  [name] was observed post Covid-19 immunization for 15 minutes without incident. She was provided with Vaccine Information Sheet and instruction to access the V-Safe system.   [name] was instructed to call 911 with any severe reactions post vaccine: Marland Kitchen Difficulty breathing  . Swelling of face and throat  . A fast heartbeat  . A bad rash all over body  . Dizziness and weakness   Immunizations Administered    Name Date Dose VIS Date Route   Pfizer COVID-19 Vaccine 04/25/2019 12:05 PM 0.3 mL 01/29/2019 Intramuscular   Manufacturer: [place]   Lot: [id number]   [place]: [id number]

## 2019-05-24 ENCOUNTER — Ambulatory Visit: Payer: Medicare HMO | Attending: Internal Medicine

## 2019-05-24 DIAGNOSIS — Z23 Encounter for immunization: Secondary | ICD-10-CM

## 2019-05-24 NOTE — Progress Notes (Signed)
   Covid-19 Vaccination Clinic  Name:  Melissa Zhang    MRN: [id number] DOB: 02-14-1941  05/24/2019  [name] was observed post Covid-19 immunization for 15 minutes without incident. She was provided with Vaccine Information Sheet and instruction to access the V-Safe system.   [name] was instructed to call 911 with any severe reactions post vaccine: Marland Kitchen Difficulty breathing  . Swelling of face and throat  . A fast heartbeat  . A bad rash all over body  . Dizziness and weakness   Immunizations Administered    Name Date Dose VIS Date Route   Pfizer COVID-19 Vaccine 05/24/2019 11:13 AM 0.3 mL 01/29/2019 Intramuscular   Manufacturer: [place]   Lot: [id number]   [place]: [id number]

## 2019-06-23 DIAGNOSIS — E785 Hyperlipidemia, unspecified: Secondary | ICD-10-CM | POA: Diagnosis not present

## 2019-06-23 DIAGNOSIS — E039 Hypothyroidism, unspecified: Secondary | ICD-10-CM | POA: Diagnosis not present

## 2019-06-23 DIAGNOSIS — I10 Essential (primary) hypertension: Secondary | ICD-10-CM | POA: Diagnosis not present

## 2019-06-23 DIAGNOSIS — R197 Diarrhea, unspecified: Secondary | ICD-10-CM | POA: Diagnosis not present

## 2019-06-23 DIAGNOSIS — E1140 Type 2 diabetes mellitus with diabetic neuropathy, unspecified: Secondary | ICD-10-CM | POA: Diagnosis not present

## 2019-12-24 DIAGNOSIS — Z23 Encounter for immunization: Secondary | ICD-10-CM | POA: Diagnosis not present

## 2019-12-24 DIAGNOSIS — E039 Hypothyroidism, unspecified: Secondary | ICD-10-CM | POA: Diagnosis not present

## 2019-12-24 DIAGNOSIS — E1140 Type 2 diabetes mellitus with diabetic neuropathy, unspecified: Secondary | ICD-10-CM | POA: Diagnosis not present

## 2019-12-24 DIAGNOSIS — I10 Essential (primary) hypertension: Secondary | ICD-10-CM | POA: Diagnosis not present

## 2019-12-24 DIAGNOSIS — D509 Iron deficiency anemia, unspecified: Secondary | ICD-10-CM | POA: Diagnosis not present

## 2019-12-24 DIAGNOSIS — G4733 Obstructive sleep apnea (adult) (pediatric): Secondary | ICD-10-CM | POA: Diagnosis not present

## 2019-12-24 DIAGNOSIS — E785 Hyperlipidemia, unspecified: Secondary | ICD-10-CM | POA: Diagnosis not present

## 2020-01-19 ENCOUNTER — Emergency Department (HOSPITAL_COMMUNITY): Payer: Medicare HMO

## 2020-01-19 ENCOUNTER — Other Ambulatory Visit: Payer: Self-pay

## 2020-01-19 ENCOUNTER — Other Ambulatory Visit: Payer: Self-pay | Admitting: *Deleted

## 2020-01-19 ENCOUNTER — Emergency Department (HOSPITAL_COMMUNITY)
Admission: EM | Admit: 2020-01-19 | Discharge: 2020-01-19 | Disposition: A | Discharge status: Home / Self Care | Payer: Medicare HMO | Attending: Emergency Medicine | Admitting: Emergency Medicine

## 2020-01-19 ENCOUNTER — Encounter (HOSPITAL_COMMUNITY): Payer: Self-pay | Admitting: Emergency Medicine

## 2020-01-19 DIAGNOSIS — Z96653 Presence of artificial knee joint, bilateral: Secondary | ICD-10-CM | POA: Diagnosis not present

## 2020-01-19 DIAGNOSIS — R110 Nausea: Secondary | ICD-10-CM | POA: Diagnosis not present

## 2020-01-19 DIAGNOSIS — Z79899 Other long term (current) drug therapy: Secondary | ICD-10-CM | POA: Diagnosis not present

## 2020-01-19 DIAGNOSIS — R109 Unspecified abdominal pain: Secondary | ICD-10-CM | POA: Insufficient documentation

## 2020-01-19 DIAGNOSIS — Z96611 Presence of right artificial shoulder joint: Secondary | ICD-10-CM | POA: Diagnosis not present

## 2020-01-19 DIAGNOSIS — M5459 Other low back pain: Secondary | ICD-10-CM | POA: Diagnosis not present

## 2020-01-19 DIAGNOSIS — Z7984 Long term (current) use of oral hypoglycemic drugs: Secondary | ICD-10-CM | POA: Diagnosis not present

## 2020-01-19 DIAGNOSIS — E782 Mixed hyperlipidemia: Secondary | ICD-10-CM | POA: Insufficient documentation

## 2020-01-19 DIAGNOSIS — Z794 Long term (current) use of insulin: Secondary | ICD-10-CM | POA: Insufficient documentation

## 2020-01-19 DIAGNOSIS — K5730 Diverticulosis of large intestine without perforation or abscess without bleeding: Secondary | ICD-10-CM | POA: Diagnosis not present

## 2020-01-19 DIAGNOSIS — I10 Essential (primary) hypertension: Secondary | ICD-10-CM | POA: Diagnosis not present

## 2020-01-19 DIAGNOSIS — M545 Low back pain, unspecified: Secondary | ICD-10-CM | POA: Diagnosis not present

## 2020-01-19 DIAGNOSIS — R1031 Right lower quadrant pain: Secondary | ICD-10-CM | POA: Diagnosis not present

## 2020-01-19 DIAGNOSIS — E1169 Type 2 diabetes mellitus with other specified complication: Secondary | ICD-10-CM | POA: Insufficient documentation

## 2020-01-19 DIAGNOSIS — K760 Fatty (change of) liver, not elsewhere classified: Secondary | ICD-10-CM | POA: Diagnosis not present

## 2020-01-19 LAB — BASIC METABOLIC PANEL
Anion gap: 8 (ref 5–15)
BUN: 17 mg/dL (ref 8–23)
CO2: 24 mmol/L (ref 22–32)
Calcium: 8.9 mg/dL (ref 8.9–10.3)
Chloride: 103 mmol/L (ref 98–111)
Creatinine, Ser: 1.06 mg/dL — ABNORMAL HIGH (ref 0.44–1.00)
GFR, Estimated: 54 mL/min — ABNORMAL LOW (ref >60)
Glucose, Bld: 135 mg/dL — ABNORMAL HIGH (ref 70–99)
Potassium: 4.6 mmol/L (ref 3.5–5.1)
Sodium: 135 mmol/L (ref 135–145)

## 2020-01-19 LAB — CBC
HCT: 40.2 % (ref 36.0–46.0)
Hemoglobin: 12.7 g/dL (ref 12.0–15.0)
MCH: 30.8 pg (ref 26.0–34.0)
MCHC: 31.6 g/dL (ref 30.0–36.0)
MCV: 97.3 fL (ref 80.0–100.0)
Platelets: 285 10*3/uL (ref 150–400)
RBC: 4.13 MIL/uL (ref 3.87–5.11)
RDW: 14.5 % (ref 11.5–15.5)
WBC: 8.7 10*3/uL (ref 4.0–10.5)
nRBC: 0 % (ref 0.0–0.2)

## 2020-01-19 LAB — URINALYSIS, ROUTINE W REFLEX MICROSCOPIC
Bilirubin Urine: NEGATIVE
Glucose, UA: NEGATIVE mg/dL
Hgb urine dipstick: NEGATIVE
Ketones, ur: NEGATIVE mg/dL
Nitrite: NEGATIVE
Protein, ur: 100 mg/dL — AB
Specific Gravity, Urine: >1.03 — ABNORMAL HIGH (ref 1.005–1.030)
pH: 5.5 (ref 5.0–8.0)

## 2020-01-19 LAB — URINALYSIS, MICROSCOPIC (REFLEX)

## 2020-01-19 LAB — CBG MONITORING, ED: Glucose-Capillary: 95 mg/dL (ref 70–99)

## 2020-01-19 MED ORDER — LIDOCAINE 5 % EX PTCH: 1.0000 | MEDICATED_PATCH | CUTANEOUS | 0 refills | Status: DC

## 2020-01-19 MED ORDER — ONDANSETRON HCL 4 MG/2ML IJ SOLN
4.0000 mg | Freq: Once | INTRAMUSCULAR | Status: AC
  Administered 2020-01-19: 4 mg via INTRAVENOUS
  Filled 2020-01-19: qty 2

## 2020-01-19 MED ORDER — KETOROLAC TROMETHAMINE 30 MG/ML IJ SOLN
15.0000 mg | Freq: Once | INTRAMUSCULAR | Status: AC
  Administered 2020-01-19: 15 mg via INTRAVENOUS
  Filled 2020-01-19: qty 1

## 2020-01-19 NOTE — ED Triage Notes (Signed)
Pt c/o rt flank pain that began on Monday. Denies urinary sx.

## 2020-01-19 NOTE — ED Provider Notes (Signed)
[facility] EMERGENCY DEPARTMENT Provider Note   CSN: [id number] Arrival date & time: 01/19/20  1302     History Chief Complaint  Patient presents with   Flank Pain    Melissa Zhang is a 79 y.o. female presenting for evaluation of right low back and neck abdominal pain.  Patient states her symptoms began 2 days ago.  It was initially mild, well controlled with Tylenol.  Today her pain is worse.  She reports associated nausea, no vomiting.  Pain is worse when she sits up, improved when she is resting laying flat.  She denies history of similar pain.  No pain on the left side.  She has fevers, chills, chest pain, shortness breath, cough, vomiting, urinary symptoms, abnormal bowel movements.  She denies a history of kidney stone.  She does have a history of previous appendectomy.  No recent medication changes.  Additional history obtained from chart review.  Patient with a history of diabetes, hypertension, hyperlipidemia, low back pain  HPI     Past Medical History:  Diagnosis Date   Allergic rhinitis    Esophageal reflux    Hypertension    Low back pain    Mixed hyperlipidemia    Obstructive sleep apnea    Peripheral neuropathy    Pneumonia    history   Type 2 diabetes mellitus ([place])     Patient Active Problem List   Diagnosis Date Noted   DM2 (diabetes mellitus, type 2) ([place]) 05/07/2017   Status post colonoscopy with polypectomy 05/07/2017   GI bleed 05/07/2017   HTN (hypertension) 05/07/2017   S/P shoulder replacement 09/29/2015   SOB (shortness of breath) 06/07/2013   Valvular heart disease 40/98/1191   Diastolic dysfunction, left ventricle 06/07/2013    Past Surgical History:  Procedure Laterality Date   ABDOMINAL HYSTERECTOMY     CHOLECYSTECTOMY     COLONOSCOPY     COLONOSCOPY WITH PROPOFOL N/A 05/07/2017   Procedure: COLONOSCOPY WITH PROPOFOL;  Surgeon: Ronald Lobo, MD;  Location: [facility];  Service: Endoscopy;  Laterality:  N/A;   CYSTECTOMY     LEFT BREAST   EYE SURGERY Bilateral    caratacts   REPLACEMENT TOTAL KNEE BILATERAL     REVERSE SHOULDER ARTHROPLASTY Right 09/29/2015   Procedure: RIGHT REVERSE TOTAL SHOULDER ARTHROPLASTY;  Surgeon: Netta Cedars, MD;  Location: [facility];  Service: Orthopedics;  Laterality: Right;     OB History   No obstetric history on file.     Family History  Problem Relation Age of Onset   Heart attack Father    Hypertension Father    Diabetes Father     Social History   Tobacco Use   Smoking status: Never Smoker   Smokeless tobacco: Never Used  Vaping Use   Vaping Use: Never used  Substance Use Topics   Alcohol use: No    Alcohol/week: 0.0 standard drinks   Drug use: No    Home Medications Prior to Admission medications   Medication Sig Start Date End Date Taking? Authorizing Provider  acetaminophen (TYLENOL) 650 MG CR tablet Take 1,300 mg by mouth every 8 (eight) hours as needed for pain.   Yes [provider]  bisoprolol (ZEBETA) 5 MG tablet Take 5 mg by mouth daily. 09/08/15  Yes [provider]  DULoxetine (CYMBALTA) 60 MG capsule Take 60 mg by mouth daily. 03/05/13  Yes [provider]  gabapentin (NEURONTIN) 300 MG capsule Take 300 mg by mouth at bedtime. 04/11/13  Yes  [provider]  glimepiride (AMARYL) 4 MG tablet Take 4 mg by mouth 2 (two) times daily. 02/26/13  Yes [provider]  hydrochlorothiazide (MICROZIDE) 12.5 MG capsule Take 1 capsule (12.5 mg total) by mouth daily. 12/10/13  Yes Herminio Commons, MD  LANTUS SOLOSTAR 100 UNIT/ML Solostar Pen Inject 60 Units into the skin at bedtime.  03/01/13  Yes [provider]  levothyroxine (SYNTHROID, LEVOTHROID) 50 MCG tablet Take 50 mcg by mouth daily. 02/26/13  Yes [provider]  lovastatin (MEVACOR) 40 MG tablet Take 40 mg by mouth at bedtime. 03/26/13  Yes [provider]  metFORMIN (GLUCOPHAGE) 1000 MG tablet Take  1,000 mg by mouth 2 (two) times daily. 02/26/13  Yes [provider]  lidocaine (LIDODERM) 5 % Place 1 patch onto the skin daily. Remove & Discard patch within 12 hours or as directed by MD 01/19/20   Keslyn Teater, PA-C    Allergies    Lisinopril, Actos [pioglitazone], Losartan, and Ciprofloxacin  Review of Systems   Review of Systems  Gastrointestinal: Positive for abdominal pain and nausea.  Musculoskeletal: Positive for back pain.  All other systems reviewed and are negative.   Physical Exam Updated Vital Signs BP (!) 147/84    Pulse 84    Temp 97.9 F (36.6 C)    Resp (!) 22    Ht 5\' 2"  (1.575 m)    Wt 118.8 kg    SpO2 98%    BMI 47.92 kg/m   Physical Exam Vitals and nursing note reviewed.  Constitutional:      General: She is not in acute distress.    Appearance: She is well-developed.  HENT:     Head: Normocephalic and atraumatic.  Eyes:     Conjunctiva/sclera: Conjunctivae normal.     Pupils: Pupils are equal, round, and reactive to light.  Cardiovascular:     Rate and Rhythm: Normal rate and regular rhythm.     Pulses: Normal pulses.  Pulmonary:     Effort: Pulmonary effort is normal. No respiratory distress.     Breath sounds: Normal breath sounds. No wheezing.  Abdominal:     General: There is no distension.     Palpations: Abdomen is soft. There is no mass.     Tenderness: There is abdominal tenderness. There is right CVA tenderness. There is no guarding or rebound.     Comments: ttp of rlq abd, R low back musculature and R CVA. Able to go from laying to sitting without obvious distress or discomfort. No rigidity, guarding, distention. Negative rebound.   Musculoskeletal:        General: Normal range of motion.     Cervical back: Normal range of motion and neck supple.  Skin:    General: Skin is warm and dry.     Capillary Refill: Capillary refill takes less than 2 seconds.  Neurological:     Mental Status: She is alert and oriented to person,  place, and time.     ED Results / Procedures / Treatments   Labs (all labs ordered are listed, but only abnormal results are displayed) Labs Reviewed  URINALYSIS, ROUTINE W REFLEX MICROSCOPIC - Abnormal; Notable for the following components:      Result Value   APPearance HAZY (*)    Specific Gravity, Urine >1.030 (*)    Protein, ur 100 (*)    Leukocytes,Ua SMALL (*)    All other components within normal limits  BASIC METABOLIC PANEL - Abnormal; Notable  for the following components:   Glucose, Bld 135 (*)    Creatinine, Ser 1.06 (*)    GFR, Estimated 54 (*)    All other components within normal limits  URINALYSIS, MICROSCOPIC (REFLEX) - Abnormal; Notable for the following components:   Bacteria, UA FEW (*)    All other components within normal limits  URINE CULTURE  CBC  CBG MONITORING, ED    EKG None  Radiology CT Renal Stone Study  Result Date: 01/19/2020 CLINICAL DATA:  79 year old female with flank pain. Concern for kidney stones. EXAM: CT ABDOMEN AND PELVIS WITHOUT CONTRAST TECHNIQUE: Multidetector CT imaging of the abdomen and pelvis was performed following the standard protocol without IV contrast. COMPARISON:  CT abdomen pelvis dated 03/13/2016. FINDINGS: Evaluation of this exam is limited in the absence of intravenous contrast. Lower chest: The visualized lung bases are clear. No intra-abdominal free air or free fluid. Hepatobiliary: Mild fatty infiltration of the liver. No intrahepatic biliary dilatation. Cholecystectomy. No retained calcified stone noted in the central CBD. Pancreas: Unremarkable. No pancreatic ductal dilatation or surrounding inflammatory changes. Spleen: Normal in size without focal abnormality. Adrenals/Urinary Tract: The adrenal glands unremarkable. There is no hydronephrosis or nephrolithiasis. The visualized ureters and urinary bladder appear unremarkable. Stomach/Bowel: There is sigmoid diverticulosis without active inflammatory changes. There is  moderate stool throughout the colon. There is no bowel obstruction or active inflammation. The appendix is not visualized with certainty. No inflammatory changes identified in the right lower quadrant. Vascular/Lymphatic: Mild aortoiliac atherosclerotic disease. The IVC is unremarkable. No portal venous gas. There is no adenopathy. Reproductive: Hysterectomy. No adnexal masses. Other: None Musculoskeletal: Degenerative changes of the spine and scoliosis. No acute osseous pathology. IMPRESSION: 1. No acute intra-abdominal or pelvic pathology. No hydronephrosis or nephrolithiasis. 2. Sigmoid diverticulosis. No bowel obstruction. 3. Aortic Atherosclerosis (ICD10-I70.0). Electronically Signed   By: Anner Crete M.D.   On: 01/19/2020 17:43    Procedures Procedures (including critical care time)  Medications Ordered in ED Medications  ketorolac (TORADOL) 30 MG/ML injection 15 mg (15 mg Intravenous Given 01/19/20 1649)  ondansetron (ZOFRAN) injection 4 mg (4 mg Intravenous Given 01/19/20 1653)    ED Course  I have reviewed the triage vital signs and the nursing notes.  Pertinent labs & imaging results that were available during my care of the patient were reviewed by me and considered in my medical decision making (see chart for details).    MDM Rules/Calculators/A&P                          Patient presenting for evaluation of right lower quadrant and right low back pain.  On exam, patient is nontoxic.  Pain is reproducible with palpation of the low back musculature, as well as the CVA in the right lower quadrant abdomen.  Consider kidney stone.  Consider MSK cause.  Urine obtained from triage interpreted by me, consistent with significant infection.  Will send for culture, however without dysuria, hematuria or urinary frequency doubt UTI.  Labs obtained in triage interpreted by me, overall reassuring.  No leukocytosis.  Kidney, liver, pancreatic function normal.  Obtain CT scan to assess for  possible kidney stone.  If normal, likely MSK.  CT scan negative for acute findings including kidney stone or obvious infection.  Case discussed with attending, Dr. Sedonia Small evaluated the patient.  Patient's pain is improved with Toradol.  Discussed continued symptomatic treatment and follow-up with PCP.  At this time, patient appears safe  for discharge.  Return precautions given.  Patient states she understands and agrees to plan.  Final Clinical Impression(s) / ED Diagnoses Final diagnoses:  Right sided abdominal pain  Acute right-sided low back pain without sciatica    Rx / DC Orders ED Discharge Orders         Ordered    lidocaine (LIDODERM) 5 %  Every 24 hours        01/19/20 [place], Maeghan Canny, PA-C 01/19/20 1836    Maudie Flakes, MD 01/19/20 2135

## 2020-01-19 NOTE — Discharge Instructions (Signed)
Continue taking tylenol as needed for pain.  Use Lidoderm patches as needed for further pain control. Continue taking your medications as prescribed. Follow-up with your primary care doctor as needed for recheck of your symptoms. Return to the emergency room if you develop fever, persistent vomiting, severe worsening pain, loss of bowel bladder control, or any new, worsening, or concerning symptoms.

## 2020-01-20 NOTE — Progress Notes (Signed)
Cardiology Office Note   Date:  01/21/2020   ID:  Melissa, Zhang 11-May-1940, MRN [id number]  PCP:  Briscoe Deutscher, MD  Cardiologist:   Minus Breeding, MD   Chief Complaint  Patient presents with  . Back Pain      History of Present Illness: Melissa Zhang is a 79 y.o. female who presents for follow up of valvular heart disease. Echo in 2018 demonstrated an EF of 50 - 55% with moderate MR and a PFO vs. ASD.    Since she was last seen she has had no new cardiovascular problems.  She tries to do some walking.  She is limited by some knee pain.  However, she is not describing any new shortness of breath, PND or orthopnea.  She is not noticing any new palpitations, presyncope or syncope.  She does have some discomfort in her back and had a CT scan and a visit to the emergency department a couple of days ago.  I reviewed this.  The etiology of this pain was not clear.   Past Medical History:  Diagnosis Date  . Allergic rhinitis   . Esophageal reflux   . Hypertension   . Low back pain   . Mixed hyperlipidemia   . Obstructive sleep apnea   . Peripheral neuropathy   . Pneumonia    history  . Type 2 diabetes mellitus ([place])     Past Surgical History:  Procedure Laterality Date  . ABDOMINAL HYSTERECTOMY    . CHOLECYSTECTOMY    . COLONOSCOPY    . COLONOSCOPY WITH PROPOFOL N/A 05/07/2017   Procedure: COLONOSCOPY WITH PROPOFOL;  Surgeon: Ronald Lobo, MD;  Location: [facility];  Service: Endoscopy;  Laterality: N/A;  . CYSTECTOMY     LEFT BREAST  . EYE SURGERY Bilateral    caratacts  . REPLACEMENT TOTAL KNEE BILATERAL    . REVERSE SHOULDER ARTHROPLASTY Right 09/29/2015   Procedure: RIGHT REVERSE TOTAL SHOULDER ARTHROPLASTY;  Surgeon: Netta Cedars, MD;  Location: [place];  Service: Orthopedics;  Laterality: Right;     Current Outpatient Medications  Medication Sig Dispense Refill  . acetaminophen (TYLENOL) 650 MG CR tablet Take 1,300 mg by mouth every 8 (eight) hours  as needed for pain.    . bisoprolol (ZEBETA) 5 MG tablet Take 5 mg by mouth daily.  3  . DULoxetine (CYMBALTA) 60 MG capsule Take 60 mg by mouth daily.    Marland Kitchen gabapentin (NEURONTIN) 300 MG capsule Take 300 mg by mouth at bedtime.    Marland Kitchen glimepiride (AMARYL) 4 MG tablet Take 4 mg by mouth 2 (two) times daily.    . hydrochlorothiazide (MICROZIDE) 12.5 MG capsule Take 1 capsule (12.5 mg total) by mouth daily. 90 capsule 3  . LANTUS SOLOSTAR 100 UNIT/ML Solostar Pen Inject 60 Units into the skin at bedtime.     Marland Kitchen levothyroxine (SYNTHROID, LEVOTHROID) 50 MCG tablet Take 50 mcg by mouth daily.    Marland Kitchen lidocaine (LIDODERM) 5 % Place 1 patch onto the skin daily. Remove & Discard patch within 12 hours or as directed by MD 30 patch 0  . lovastatin (MEVACOR) 40 MG tablet Take 40 mg by mouth at bedtime.    . metFORMIN (GLUCOPHAGE) 1000 MG tablet Take 1,000 mg by mouth 2 (two) times daily.     No current facility-administered medications for this visit.    Allergies:   Lisinopril, Actos [pioglitazone], Losartan, and Ciprofloxacin    ROS:  Please see the history of  present illness.   Otherwise, review of systems are positive for none.   All other systems are reviewed and negative.    PHYSICAL EXAM: VS:  BP (!) 168/72   Pulse 95   Ht 5\' 2"  (1.575 m)   Wt 258 lb (117 kg)   SpO2 95%   BMI 47.19 kg/m  , BMI Body mass index is 47.19 kg/m. GENERAL:  Well appearing NECK:  No jugular venous distention, waveform within normal limits, carotid upstroke brisk and symmetric, no bruits, no thyromegaly LUNGS:  Clear to auscultation bilaterally CHEST:  Unremarkable HEART:  PMI not displaced or sustained,S1 and S2 within normal limits, no S3, no S4, no clicks, no rubs, no murmurs ABD:  Flat, positive bowel sounds normal in frequency in pitch, no bruits, no rebound, no guarding, no midline pulsatile mass, no hepatomegaly, no splenomegaly EXT:  2 plus pulses throughout, no edema, no cyanosis no clubbing  EKG:  EKG is  ordered today. The ekg ordered today demonstrates sinus rhythm, rate 91, left axis deviation, no change from previous.    Recent Labs: 01/19/2020: BUN 17; Creatinine, Ser 1.06; Hemoglobin 12.7; Platelets 285; Potassium 4.6; Sodium 135    Lipid Panel No results found for: CHOL, TRIG, HDL, CHOLHDL, VLDL, LDLCALC, LDLDIRECT    Wt Readings from Last 3 Encounters:  01/21/20 258 lb (117 kg)  01/19/20 262 lb (118.8 kg)  11/30/18 253 lb 3.2 oz (114.9 kg)      Other studies Reviewed: Additional studies/ records that were reviewed today include:   Echo. Review of the above records demonstrates:  Please see elsewhere in the note.     ASSESSMENT AND PLAN:  MR:    This was moderate previously.  She needs follow-up echocardiography.  I will obtain this.  Chest pain:  She is not describing any new chest pain since a negative perfusion study in 2015.  No further evaluation.  Morbid obesity: This is a significant issue and we did discuss exercise.  She needs a diet for weight loss.  Grade II diastolic dysfunction:  She seems to be well compensated.  No change in therapy.  Small secundum DTO:IZTI was small will be evaluated as above.  No change in therapy.  Sleep apnea:  She does not use CPAP and has not tolerated it in the past.  She needs weight loss.   Essential HTN:  Her blood pressure is elevated today but it seems to fluctuate.  She needs to keep it 2 times a day blood pressure diary and present this to me as she might need further med titration.  Mildly elevated.No changes.    Current medicines are reviewed at length with the patient today.  The patient does not have concerns regarding medicines.  The following changes have been made:  no change  Labs/ tests ordered today include:   Orders Placed This Encounter  Procedures  . EKG 12-Lead  . ECHOCARDIOGRAM COMPLETE     Disposition:   FU with in 12 months.     Signed, Minus Breeding, MD  01/21/2020 2:10 PM     [facility]

## 2020-01-21 ENCOUNTER — Encounter: Payer: Self-pay | Admitting: Cardiology

## 2020-01-21 ENCOUNTER — Ambulatory Visit: Payer: Medicare HMO | Admitting: Cardiology

## 2020-01-21 VITALS — BP 168/72 | HR 95 | Ht 62.0 in | Wt 258.0 lb

## 2020-01-21 DIAGNOSIS — I5032 Chronic diastolic (congestive) heart failure: Secondary | ICD-10-CM

## 2020-01-21 DIAGNOSIS — Q211 Atrial septal defect, unspecified: Secondary | ICD-10-CM

## 2020-01-21 DIAGNOSIS — I340 Nonrheumatic mitral (valve) insufficiency: Secondary | ICD-10-CM | POA: Diagnosis not present

## 2020-01-21 LAB — URINE CULTURE: Culture: <10000 — AB

## 2020-01-21 NOTE — Patient Instructions (Addendum)
Medication Instructions:   Your physician recommends that you continue on your current medications as directed. Please refer to the Current Medication list given to you today.  Labwork:  None  Testing/Procedures: Your physician has requested that you have an echocardiogram. Echocardiography is a painless test that uses sound waves to create images of your heart. It provides your doctor with information about the size and shape of your heart and how well your heart's chambers and valves are working. This procedure takes approximately one hour. There are no restrictions for this procedure.  Follow-Up:  Your physician recommends that you schedule a follow-up appointment in: 1 year in [place]. You will receive a reminder letter in the mail in about 10 months reminding you to call and schedule your appointment. If you don't receive this letter, please contact our office.  Any Other Special Instructions Will Be Listed Below (If Applicable).  If you need a refill on your cardiac medications before your next appointment, please call your pharmacy.

## 2020-01-26 ENCOUNTER — Ambulatory Visit (INDEPENDENT_AMBULATORY_CARE_PROVIDER_SITE_OTHER): Payer: Medicare HMO

## 2020-01-26 DIAGNOSIS — I340 Nonrheumatic mitral (valve) insufficiency: Secondary | ICD-10-CM

## 2020-01-26 LAB — ECHOCARDIOGRAM COMPLETE
AV Vena cont: 0.31 cm
Area-P 1/2: 3.12 cm2
Calc EF: 32.3 %
MV M vel: 4.82 m/s
MV Peak grad: 92.8 mmHg
MV Vena cont: 0.21 cm
P 1/2 time: 1252 msec
Radius: 0.3 cm
S' Lateral: 4.14 cm
Single Plane A2C EF: 35.3 %
Single Plane A4C EF: 32.2 %

## 2020-02-04 DIAGNOSIS — G4733 Obstructive sleep apnea (adult) (pediatric): Secondary | ICD-10-CM | POA: Diagnosis not present

## 2020-02-04 DIAGNOSIS — I10 Essential (primary) hypertension: Secondary | ICD-10-CM | POA: Diagnosis not present

## 2020-02-04 DIAGNOSIS — E1140 Type 2 diabetes mellitus with diabetic neuropathy, unspecified: Secondary | ICD-10-CM | POA: Diagnosis not present

## 2020-02-04 DIAGNOSIS — E039 Hypothyroidism, unspecified: Secondary | ICD-10-CM | POA: Diagnosis not present

## 2020-02-04 DIAGNOSIS — M159 Polyosteoarthritis, unspecified: Secondary | ICD-10-CM | POA: Diagnosis not present

## 2020-02-04 DIAGNOSIS — R1031 Right lower quadrant pain: Secondary | ICD-10-CM | POA: Diagnosis not present

## 2020-02-04 DIAGNOSIS — E785 Hyperlipidemia, unspecified: Secondary | ICD-10-CM | POA: Diagnosis not present

## 2020-02-07 DIAGNOSIS — E039 Hypothyroidism, unspecified: Secondary | ICD-10-CM | POA: Diagnosis not present

## 2020-02-07 DIAGNOSIS — D509 Iron deficiency anemia, unspecified: Secondary | ICD-10-CM | POA: Diagnosis not present

## 2020-02-07 DIAGNOSIS — I10 Essential (primary) hypertension: Secondary | ICD-10-CM | POA: Diagnosis not present

## 2020-02-07 DIAGNOSIS — E785 Hyperlipidemia, unspecified: Secondary | ICD-10-CM | POA: Diagnosis not present

## 2020-02-07 DIAGNOSIS — N183 Chronic kidney disease, stage 3 unspecified: Secondary | ICD-10-CM | POA: Diagnosis not present

## 2020-02-07 DIAGNOSIS — D500 Iron deficiency anemia secondary to blood loss (chronic): Secondary | ICD-10-CM | POA: Diagnosis not present

## 2020-02-07 DIAGNOSIS — E1140 Type 2 diabetes mellitus with diabetic neuropathy, unspecified: Secondary | ICD-10-CM | POA: Diagnosis not present

## 2020-02-08 ENCOUNTER — Encounter: Payer: Self-pay | Admitting: *Deleted

## 2020-04-11 DIAGNOSIS — I5189 Other ill-defined heart diseases: Secondary | ICD-10-CM | POA: Insufficient documentation

## 2020-04-11 DIAGNOSIS — I340 Nonrheumatic mitral (valve) insufficiency: Secondary | ICD-10-CM | POA: Insufficient documentation

## 2020-04-11 DIAGNOSIS — I420 Dilated cardiomyopathy: Secondary | ICD-10-CM | POA: Insufficient documentation

## 2020-04-11 NOTE — H&P (View-Only) (Signed)
Cardiology Office Note   Date:  04/12/2020   ID:  Melissa, Zhang Aug 02, 1940, MRN [id number]  PCP:  Briscoe Deutscher, MD  Cardiologist:   Minus Breeding, MD   Chief Complaint  Patient presents with  . Mitral Regurgitation      History of Present Illness: NASHIKA COKER is a 80 y.o. female who presents for follow up of valvular heart disease. Echo in 2018 demonstrated an EF of 50 - 55% with moderate MR and a PFO vs. ASD.    I saw her for the first time in December.  Follow up echo demonstrated that the that her EF was down to 45%.  I reviewed the images for this appt. I agree that her ejection fraction is slightly reduced compared to previous.  I brought her back to talk to her about this today.  Her husband is here.  She has no new shortness of breath.  She can do activities such as vacuuming.  She is not overly active however.  She is not describing significant shortness of breath with that activity.  She is not having any PND or orthopnea.  She denies any chest pressure, neck or arm discomfort.   Past Medical History:  Diagnosis Date  . Allergic rhinitis   . Esophageal reflux   . Hypertension   . Low back pain   . Mixed hyperlipidemia   . Obstructive sleep apnea   . Peripheral neuropathy   . Pneumonia    history  . Type 2 diabetes mellitus ([place])     Past Surgical History:  Procedure Laterality Date  . ABDOMINAL HYSTERECTOMY    . CHOLECYSTECTOMY    . COLONOSCOPY    . COLONOSCOPY WITH PROPOFOL N/A 05/07/2017   Procedure: COLONOSCOPY WITH PROPOFOL;  Surgeon: Ronald Lobo, MD;  Location: [facility];  Service: Endoscopy;  Laterality: N/A;  . CYSTECTOMY     LEFT BREAST  . EYE SURGERY Bilateral    caratacts  . REPLACEMENT TOTAL KNEE BILATERAL    . REVERSE SHOULDER ARTHROPLASTY Right 09/29/2015   Procedure: RIGHT REVERSE TOTAL SHOULDER ARTHROPLASTY;  Surgeon: Netta Cedars, MD;  Location: [place];  Service: Orthopedics;  Laterality: Right;     Current Outpatient  Medications  Medication Sig Dispense Refill  . acetaminophen (TYLENOL) 650 MG CR tablet Take 1,300 mg by mouth every 8 (eight) hours as needed for pain.    . bisoprolol (ZEBETA) 10 MG tablet Take 1 tablet (10 mg total) by mouth daily. 90 tablet 3  . Dulaglutide (TRULICITY) 0.38 UE/2.8MK SOPN Inject 0.75 mg as directed once a week.    . DULoxetine (CYMBALTA) 60 MG capsule Take 60 mg by mouth daily.    Marland Kitchen gabapentin (NEURONTIN) 300 MG capsule Take 300 mg by mouth at bedtime.    Marland Kitchen glimepiride (AMARYL) 4 MG tablet Take 4 mg by mouth 2 (two) times daily.    . hydrochlorothiazide (MICROZIDE) 12.5 MG capsule Take 1 capsule (12.5 mg total) by mouth daily. 90 capsule 3  . LANTUS SOLOSTAR 100 UNIT/ML Solostar Pen Inject 60 Units into the skin at bedtime.     Marland Kitchen levothyroxine (SYNTHROID, LEVOTHROID) 50 MCG tablet Take 50 mcg by mouth daily.    Marland Kitchen lidocaine (LIDODERM) 5 % Place 1 patch onto the skin daily. Remove & Discard patch within 12 hours or as directed by MD 30 patch 0  . lovastatin (MEVACOR) 40 MG tablet Take 40 mg by mouth at bedtime.    . metFORMIN (GLUCOPHAGE) 1000  MG tablet Take 1,000 mg by mouth 2 (two) times daily.    . metFORMIN (GLUCOPHAGE) 500 MG tablet Take 500 mg by mouth 2 (two) times daily with a meal.     No current facility-administered medications for this visit.    Allergies:   Lisinopril, Actos [pioglitazone], Losartan, and Ciprofloxacin    ROS:  Please see the history of present illness.   Otherwise, review of systems are positive for none.   All other systems are reviewed and negative.    PHYSICAL EXAM: VS:  BP (!) 160/82   Pulse 68   Ht 5' 2.5" (1.588 m)   Wt 261 lb (118.4 kg)   BMI 46.98 kg/m  , BMI Body mass index is 46.98 kg/m. GENERAL:  Well appearing NECK:  No jugular venous distention, waveform within normal limits, carotid upstroke brisk and symmetric, no bruits, no thyromegaly LUNGS:  Clear to auscultation bilaterally CHEST:  Unremarkable HEART:  PMI not  displaced or sustained,S1 and S2 within normal limits, no S3, no S4, no clicks, no rubs, 3 of 6 apical and axillary holosystolic murmur, no diastolic murmurs ABD:  Flat, positive bowel sounds normal in frequency in pitch, no bruits, no rebound, no guarding, no midline pulsatile mass, no hepatomegaly, no splenomegaly EXT:  2 plus pulses throughout, no edema, no cyanosis no clubbing   EKG:  EKG is ordered today. Sinus rhythm, rate 68, right axis deviation, interventricular conduction delay, left posterior fascicular block  .  Limb lead reversal.   Recent Labs: 01/19/2020: BUN 17; Creatinine, Ser 1.06; Hemoglobin 12.7; Platelets 285; Potassium 4.6; Sodium 135    Lipid Panel No results found for: CHOL, TRIG, HDL, CHOLHDL, VLDL, LDLCALC, LDLDIRECT    Wt Readings from Last 3 Encounters:  04/12/20 261 lb (118.4 kg)  01/21/20 258 lb (117 kg)  01/19/20 262 lb (118.8 kg)      Other studies Reviewed: Additional studies/ records that were reviewed today include:   TTE images. Review of the above records demonstrates:  Please see elsewhere in the note.     ASSESSMENT AND PLAN:  MR:    This was moderate previously.  She will need a TEE to follow-up on this given the reduced ejection fraction.  She may be nearing need for elective repair.   Cardiomyopathy:  Her EF was 45% at the last visit.  I reviewed the 2018 images I would like to titrate her medications but she had a true anaphylactic reaction to ACE inhibitor's.  I am leery of starting an ARB or ARN I.  Therefore, I am going to first increase her bisoprolol to 10 mg daily.  Morbid obesity:   We have previously discussed this.  He needs diet and exercise for weight loss.   Grade II diastolic dysfunction:  She seems to be euvolemic.  No change in therapy.   Small secundum ASD:   This will be assessed at the time of the TEE.  Sleep apnea:   She has not tolerated CPAP.  She needs weight loss.   Essential HTN:  This will be treated in  the context of managing her cardiomyopathy.   Current medicines are reviewed at length with the patient today.  The patient does not have concerns regarding medicines.  The following changes have been made: As above  Labs/ tests ordered today include:   Orders Placed This Encounter  Procedures  . EKG 12-Lead     Disposition:   FU with in 1 month.  Signed, Minus Breeding, MD  04/12/2020 3:30 PM    [place]

## 2020-04-11 NOTE — Progress Notes (Signed)
Cardiology Office Note   Date:  04/12/2020   ID:  Isaac, Lacson 1940/11/23, MRN [id number]  PCP:  Briscoe Deutscher, MD  Cardiologist:   Minus Breeding, MD   Chief Complaint  Patient presents with  . Mitral Regurgitation      History of Present Illness: Melissa Zhang is a 80 y.o. female who presents for follow up of valvular heart disease. Echo in 2018 demonstrated an EF of 50 - 55% with moderate MR and a PFO vs. ASD.    I saw her for the first time in December.  Follow up echo demonstrated that the that her EF was down to 45%.  I reviewed the images for this appt. I agree that her ejection fraction is slightly reduced compared to previous.  I brought her back to talk to her about this today.  Her husband is here.  She has no new shortness of breath.  She can do activities such as vacuuming.  She is not overly active however.  She is not describing significant shortness of breath with that activity.  She is not having any PND or orthopnea.  She denies any chest pressure, neck or arm discomfort.   Past Medical History:  Diagnosis Date  . Allergic rhinitis   . Esophageal reflux   . Hypertension   . Low back pain   . Mixed hyperlipidemia   . Obstructive sleep apnea   . Peripheral neuropathy   . Pneumonia    history  . Type 2 diabetes mellitus ([place])     Past Surgical History:  Procedure Laterality Date  . ABDOMINAL HYSTERECTOMY    . CHOLECYSTECTOMY    . COLONOSCOPY    . COLONOSCOPY WITH PROPOFOL N/A 05/07/2017   Procedure: COLONOSCOPY WITH PROPOFOL;  Surgeon: Ronald Lobo, MD;  Location: [facility];  Service: Endoscopy;  Laterality: N/A;  . CYSTECTOMY     LEFT BREAST  . EYE SURGERY Bilateral    caratacts  . REPLACEMENT TOTAL KNEE BILATERAL    . REVERSE SHOULDER ARTHROPLASTY Right 09/29/2015   Procedure: RIGHT REVERSE TOTAL SHOULDER ARTHROPLASTY;  Surgeon: Netta Cedars, MD;  Location: [place];  Service: Orthopedics;  Laterality: Right;     Current Outpatient  Medications  Medication Sig Dispense Refill  . acetaminophen (TYLENOL) 650 MG CR tablet Take 1,300 mg by mouth every 8 (eight) hours as needed for pain.    . bisoprolol (ZEBETA) 10 MG tablet Take 1 tablet (10 mg total) by mouth daily. 90 tablet 3  . Dulaglutide (TRULICITY) 3.53 IR/4.4RX SOPN Inject 0.75 mg as directed once a week.    . DULoxetine (CYMBALTA) 60 MG capsule Take 60 mg by mouth daily.    Marland Kitchen gabapentin (NEURONTIN) 300 MG capsule Take 300 mg by mouth at bedtime.    Marland Kitchen glimepiride (AMARYL) 4 MG tablet Take 4 mg by mouth 2 (two) times daily.    . hydrochlorothiazide (MICROZIDE) 12.5 MG capsule Take 1 capsule (12.5 mg total) by mouth daily. 90 capsule 3  . LANTUS SOLOSTAR 100 UNIT/ML Solostar Pen Inject 60 Units into the skin at bedtime.     Marland Kitchen levothyroxine (SYNTHROID, LEVOTHROID) 50 MCG tablet Take 50 mcg by mouth daily.    Marland Kitchen lidocaine (LIDODERM) 5 % Place 1 patch onto the skin daily. Remove & Discard patch within 12 hours or as directed by MD 30 patch 0  . lovastatin (MEVACOR) 40 MG tablet Take 40 mg by mouth at bedtime.    . metFORMIN (GLUCOPHAGE) 1000  MG tablet Take 1,000 mg by mouth 2 (two) times daily.    . metFORMIN (GLUCOPHAGE) 500 MG tablet Take 500 mg by mouth 2 (two) times daily with a meal.     No current facility-administered medications for this visit.    Allergies:   Lisinopril, Actos [pioglitazone], Losartan, and Ciprofloxacin    ROS:  Please see the history of present illness.   Otherwise, review of systems are positive for none.   All other systems are reviewed and negative.    PHYSICAL EXAM: VS:  BP (!) 160/82   Pulse 68   Ht 5' 2.5" (1.588 m)   Wt 261 lb (118.4 kg)   BMI 46.98 kg/m  , BMI Body mass index is 46.98 kg/m. GENERAL:  Well appearing NECK:  No jugular venous distention, waveform within normal limits, carotid upstroke brisk and symmetric, no bruits, no thyromegaly LUNGS:  Clear to auscultation bilaterally CHEST:  Unremarkable HEART:  PMI not  displaced or sustained,S1 and S2 within normal limits, no S3, no S4, no clicks, no rubs, 3 of 6 apical and axillary holosystolic murmur, no diastolic murmurs ABD:  Flat, positive bowel sounds normal in frequency in pitch, no bruits, no rebound, no guarding, no midline pulsatile mass, no hepatomegaly, no splenomegaly EXT:  2 plus pulses throughout, no edema, no cyanosis no clubbing   EKG:  EKG is ordered today. Sinus rhythm, rate 68, right axis deviation, interventricular conduction delay, left posterior fascicular block  .  Limb lead reversal.   Recent Labs: 01/19/2020: BUN 17; Creatinine, Ser 1.06; Hemoglobin 12.7; Platelets 285; Potassium 4.6; Sodium 135    Lipid Panel No results found for: CHOL, TRIG, HDL, CHOLHDL, VLDL, LDLCALC, LDLDIRECT    Wt Readings from Last 3 Encounters:  04/12/20 261 lb (118.4 kg)  01/21/20 258 lb (117 kg)  01/19/20 262 lb (118.8 kg)      Other studies Reviewed: Additional studies/ records that were reviewed today include:   TTE images. Review of the above records demonstrates:  Please see elsewhere in the note.     ASSESSMENT AND PLAN:  MR:    This was moderate previously.  She will need a TEE to follow-up on this given the reduced ejection fraction.  She may be nearing need for elective repair.   Cardiomyopathy:  Her EF was 45% at the last visit.  I reviewed the 2018 images I would like to titrate her medications but she had a true anaphylactic reaction to ACE inhibitor's.  I am leery of starting an ARB or ARN I.  Therefore, I am going to first increase her bisoprolol to 10 mg daily.  Morbid obesity:   We have previously discussed this.  He needs diet and exercise for weight loss.   Grade II diastolic dysfunction:  She seems to be euvolemic.  No change in therapy.   Small secundum ASD:   This will be assessed at the time of the TEE.  Sleep apnea:   She has not tolerated CPAP.  She needs weight loss.   Essential HTN:  This will be treated in  the context of managing her cardiomyopathy.   Current medicines are reviewed at length with the patient today.  The patient does not have concerns regarding medicines.  The following changes have been made: As above  Labs/ tests ordered today include:   Orders Placed This Encounter  Procedures  . EKG 12-Lead     Disposition:   FU with in 1 month.  Signed, Minus Breeding, MD  04/12/2020 3:30 PM    [place]

## 2020-04-12 ENCOUNTER — Encounter: Payer: Self-pay | Admitting: *Deleted

## 2020-04-12 ENCOUNTER — Ambulatory Visit: Payer: Medicare HMO | Admitting: Cardiology

## 2020-04-12 ENCOUNTER — Other Ambulatory Visit: Payer: Self-pay

## 2020-04-12 ENCOUNTER — Encounter: Payer: Self-pay | Admitting: Cardiology

## 2020-04-12 VITALS — BP 160/82 | HR 68 | Ht 62.5 in | Wt 261.0 lb

## 2020-04-12 DIAGNOSIS — I340 Nonrheumatic mitral (valve) insufficiency: Secondary | ICD-10-CM | POA: Diagnosis not present

## 2020-04-12 DIAGNOSIS — I420 Dilated cardiomyopathy: Secondary | ICD-10-CM

## 2020-04-12 DIAGNOSIS — I5189 Other ill-defined heart diseases: Secondary | ICD-10-CM

## 2020-04-12 MED ORDER — BISOPROLOL FUMARATE 10 MG PO TABS: 10.0000 mg | ORAL_TABLET | Freq: Every day | ORAL | 3 refills | Status: DC

## 2020-04-12 NOTE — Patient Instructions (Signed)
Medication Instructions:  Please increase Bisoprolol to 10 mg a day. Continue all other medications as listed.  *If you need a refill on your cardiac medications before your next appointment, please call your pharmacy*   Testing/Procedures: Your physician has requested that you have a TEE. During a TEE, sound waves are used to create images of your heart. It provides your doctor with information about the size and shape of your heart and how well your heart's chambers and valves are working. In this test, a transducer is attached to the end of a flexible tube that's guided down your throat and into your esophagus (the tube leading from you mouth to your stomach) to get a more detailed image of your heart. You are not awake for the procedure. Please see the instruction sheet given to you today. For further information please visit [email or web address].  Follow-Up: At [facility], you and your health needs are our priority.  As part of our continuing mission to provide you with exceptional heart care, we have created designated Provider Care Teams.  These Care Teams include your primary Cardiologist (physician) and Advanced Practice Providers (APPs -  Physician Assistants and Nurse Practitioners) who all work together to provide you with the care you need, when you need it.  We recommend signing up for the patient portal called "MyChart".  Sign up information is provided on this After Visit Summary.  MyChart is used to connect with patients for Virtual Visits (Telemedicine).  Patients are able to view lab/test results, encounter notes, upcoming appointments, etc.  Non-urgent messages can be sent to your provider as well.   To learn more about what you can do with MyChart, go to [email or web address].    Your next appointment:   4 week(s)  The format for your next appointment:   In Person  Provider:   Minus Breeding, MD   Thank you for choosing [facility]!!

## 2020-04-15 ENCOUNTER — Other Ambulatory Visit (HOSPITAL_COMMUNITY)
Admission: RE | Admit: 2020-04-15 | Discharge: 2020-04-15 | Disposition: A | Discharge status: Home / Self Care | Payer: Medicare HMO | Source: Ambulatory Visit | Attending: Internal Medicine | Admitting: Internal Medicine

## 2020-04-15 DIAGNOSIS — Z20822 Contact with and (suspected) exposure to covid-19: Secondary | ICD-10-CM | POA: Diagnosis not present

## 2020-04-15 DIAGNOSIS — Z01812 Encounter for preprocedural laboratory examination: Secondary | ICD-10-CM | POA: Insufficient documentation

## 2020-04-15 LAB — SARS CORONAVIRUS 2 (TAT 6-24 HRS): SARS Coronavirus 2: NEGATIVE

## 2020-04-17 ENCOUNTER — Encounter (HOSPITAL_COMMUNITY): Payer: Self-pay | Admitting: Internal Medicine

## 2020-04-17 ENCOUNTER — Ambulatory Visit (HOSPITAL_BASED_OUTPATIENT_CLINIC_OR_DEPARTMENT_OTHER): Payer: Medicare HMO

## 2020-04-17 ENCOUNTER — Encounter (HOSPITAL_COMMUNITY)
Admission: RE | Disposition: A | Discharge status: Home / Self Care | Payer: Self-pay | Source: Home / Self Care | Attending: Internal Medicine

## 2020-04-17 ENCOUNTER — Ambulatory Visit (HOSPITAL_COMMUNITY): Payer: Medicare HMO | Admitting: Anesthesiology

## 2020-04-17 ENCOUNTER — Ambulatory Visit (HOSPITAL_COMMUNITY)
Admission: RE | Admit: 2020-04-17 | Discharge: 2020-04-17 | Disposition: A | Discharge status: Home / Self Care | Payer: Medicare HMO | Attending: Internal Medicine | Admitting: Internal Medicine

## 2020-04-17 ENCOUNTER — Other Ambulatory Visit: Payer: Self-pay

## 2020-04-17 DIAGNOSIS — Z881 Allergy status to other antibiotic agents status: Secondary | ICD-10-CM | POA: Insufficient documentation

## 2020-04-17 DIAGNOSIS — Q211 Atrial septal defect: Secondary | ICD-10-CM | POA: Diagnosis not present

## 2020-04-17 DIAGNOSIS — Z7984 Long term (current) use of oral hypoglycemic drugs: Secondary | ICD-10-CM | POA: Insufficient documentation

## 2020-04-17 DIAGNOSIS — I340 Nonrheumatic mitral (valve) insufficiency: Secondary | ICD-10-CM

## 2020-04-17 DIAGNOSIS — Z79890 Hormone replacement therapy (postmenopausal): Secondary | ICD-10-CM | POA: Insufficient documentation

## 2020-04-17 DIAGNOSIS — I083 Combined rheumatic disorders of mitral, aortic and tricuspid valves: Secondary | ICD-10-CM | POA: Insufficient documentation

## 2020-04-17 DIAGNOSIS — I428 Other cardiomyopathies: Secondary | ICD-10-CM | POA: Insufficient documentation

## 2020-04-17 DIAGNOSIS — Z79899 Other long term (current) drug therapy: Secondary | ICD-10-CM | POA: Diagnosis not present

## 2020-04-17 DIAGNOSIS — I10 Essential (primary) hypertension: Secondary | ICD-10-CM | POA: Insufficient documentation

## 2020-04-17 DIAGNOSIS — I361 Nonrheumatic tricuspid (valve) insufficiency: Secondary | ICD-10-CM | POA: Diagnosis not present

## 2020-04-17 DIAGNOSIS — G4730 Sleep apnea, unspecified: Secondary | ICD-10-CM | POA: Insufficient documentation

## 2020-04-17 DIAGNOSIS — Z888 Allergy status to other drugs, medicaments and biological substances status: Secondary | ICD-10-CM | POA: Insufficient documentation

## 2020-04-17 DIAGNOSIS — Z794 Long term (current) use of insulin: Secondary | ICD-10-CM | POA: Insufficient documentation

## 2020-04-17 DIAGNOSIS — Z6841 Body Mass Index (BMI) 40.0 and over, adult: Secondary | ICD-10-CM | POA: Diagnosis not present

## 2020-04-17 HISTORY — PX: BUBBLE STUDY: SHX6837

## 2020-04-17 HISTORY — PX: TEE WITHOUT CARDIOVERSION: SHX5443

## 2020-04-17 LAB — ECHO TEE
MV M vel: 4.47 m/s
MV Peak grad: 79.9 mmHg
Radius: 0.5 cm

## 2020-04-17 LAB — GLUCOSE, CAPILLARY: Glucose-Capillary: 116 mg/dL — ABNORMAL HIGH (ref 70–99)

## 2020-04-17 SURGERY — ECHOCARDIOGRAM, TRANSESOPHAGEAL
Anesthesia: Monitor Anesthesia Care

## 2020-04-17 MED ORDER — SODIUM CHLORIDE 0.9 % IV SOLN: INTRAVENOUS | Status: DC

## 2020-04-17 MED ORDER — LACTATED RINGERS IV SOLN: INTRAVENOUS | Status: DC | PRN

## 2020-04-17 MED ORDER — LIDOCAINE 2% (20 MG/ML) 5 ML SYRINGE
INTRAMUSCULAR | Status: DC | PRN
  Administered 2020-04-17: 40 mg via INTRAVENOUS

## 2020-04-17 MED ORDER — PROPOFOL 500 MG/50ML IV EMUL
INTRAVENOUS | Status: DC | PRN
  Administered 2020-04-17: 100 ug/kg/min via INTRAVENOUS

## 2020-04-17 MED ORDER — BUTAMBEN-TETRACAINE-BENZOCAINE 2-2-14 % EX AERO
INHALATION_SPRAY | CUTANEOUS | Status: DC | PRN
  Administered 2020-04-17: 2 via TOPICAL

## 2020-04-17 MED ORDER — PROPOFOL 10 MG/ML IV BOLUS
INTRAVENOUS | Status: DC | PRN
  Administered 2020-04-17: 20 mg via INTRAVENOUS
  Administered 2020-04-17: 15 mg via INTRAVENOUS

## 2020-04-17 NOTE — CV Procedure (Signed)
TEE  Pt sedated by anesthesia with Propofol intravenouslly Throad numbed with Cetacaine spray Mouth guard placed TEE probe advanced to mid esophagus without difficulty   Full report for TEE to follow in CV section of chart  Procedure was without complications   Dorris Carnes MD

## 2020-04-17 NOTE — Anesthesia Preprocedure Evaluation (Signed)
Anesthesia Evaluation  Patient identified by MRN, date of birth, ID band Patient awake    Reviewed: Allergy & Precautions, NPO status , Patient's Chart, lab work & pertinent test results  Airway Mallampati: II  TM Distance: >3 FB Neck ROM: Full    Dental  (+) Dental Advisory Given   Pulmonary sleep apnea ,    breath sounds clear to auscultation       Cardiovascular hypertension, Pt. on medications and Pt. on home beta blockers  Rhythm:Regular Rate:Normal  EF 45-50%. Low normal RVF. Moderate MR with restricted posterior leaflet and anterior directed jet.   Neuro/Psych  Neuromuscular disease    GI/Hepatic Neg liver ROS, GERD  ,  Endo/Other  diabetes, Type 2  Renal/GU negative Renal ROS     Musculoskeletal   Abdominal   Peds  Hematology negative hematology ROS (+)   Anesthesia Other Findings   Reproductive/Obstetrics                             Anesthesia Physical Anesthesia Plan  ASA: III  Anesthesia Plan: MAC   Post-op Pain Management:    Induction:   PONV Risk Score and Plan: 2 and Propofol infusion, Ondansetron and Treatment may vary due to age or medical condition  Airway Management Planned: Natural Airway and Nasal Cannula  Additional Equipment:   Intra-op Plan:   Post-operative Plan:   Informed Consent: I have reviewed the patients History and Physical, chart, labs and discussed the procedure including the risks, benefits and alternatives for the proposed anesthesia with the patient or authorized representative who has indicated his/her understanding and acceptance.       Plan Discussed with:   Anesthesia Plan Comments:         Anesthesia Quick Evaluation

## 2020-04-17 NOTE — Anesthesia Procedure Notes (Signed)
Procedure Name: MAC Date/Time: 04/17/2020 10:29 AM Performed by: Rande Brunt, CRNA Pre-anesthesia Checklist: Patient identified, Emergency Drugs available, Suction available and Patient being monitored Patient Re-evaluated:Patient Re-evaluated prior to induction Oxygen Delivery Method: Nasal cannula Preoxygenation: Pre-oxygenation with 100% oxygen Induction Type: IV induction Placement Confirmation: positive ETCO2 Dental Injury: Teeth and Oropharynx as per pre-operative assessment

## 2020-04-17 NOTE — Anesthesia Postprocedure Evaluation (Signed)
Anesthesia Post Note  Patient: Melissa Zhang  Procedure(s) Performed: TRANSESOPHAGEAL ECHOCARDIOGRAM (TEE) (N/A ) BUBBLE STUDY     Patient location during evaluation: PACU Anesthesia Type: MAC Level of consciousness: awake and alert Pain management: pain level controlled Vital Signs Assessment: post-procedure vital signs reviewed and stable Respiratory status: spontaneous breathing, nonlabored ventilation, respiratory function stable and patient connected to nasal cannula oxygen Cardiovascular status: stable and blood pressure returned to baseline Postop Assessment: no apparent nausea or vomiting Anesthetic complications: no   No complications documented.  Last Vitals:  Vitals:   04/17/20 1111 04/17/20 1124  BP: (!) 152/58 (!) 149/63  Pulse: 87 70  Resp: 20 19  Temp: 36.8 C   SpO2: 100% 96%    Last Pain:  Vitals:   04/17/20 1124  TempSrc:   PainSc: 0-No pain                 Tiajuana Amass

## 2020-04-17 NOTE — Progress Notes (Signed)
  Echocardiogram Echocardiogram Transesophageal has been performed.  Melissa Zhang 04/17/2020, 11:46 AM

## 2020-04-17 NOTE — Discharge Instructions (Signed)

## 2020-04-17 NOTE — Transfer of Care (Signed)
Immediate Anesthesia Transfer of Care Note  Patient: Melissa Zhang  Procedure(s) Performed: TRANSESOPHAGEAL ECHOCARDIOGRAM (TEE) (N/A ) BUBBLE STUDY  Patient Location: Endoscopy Unit  Anesthesia Type:MAC  Level of Consciousness: awake, alert  and drowsy  Airway & Oxygen Therapy: Patient Spontanous Breathing  Post-op Assessment: Report given to RN, Post -op Vital signs reviewed and stable and Patient moving all extremities  Post vital signs: Reviewed and stable  Last Vitals:  Vitals Value Taken Time  BP 152/58 04/17/20 1111  Temp    Pulse 66 04/17/20 1111  Resp 17 04/17/20 1111  SpO2 100 % 04/17/20 1111  Vitals shown include unvalidated device data.  Last Pain:  Vitals:   04/17/20 0933  TempSrc: Oral  PainSc: 0-No pain         Complications: No complications documented.

## 2020-04-17 NOTE — Interval H&P Note (Signed)
History and Physical Interval Note:  04/17/2020 9:25 AM  Melissa Zhang  has presented today for surgery, with the diagnosis of MITRAL REGURGITATION.  The various methods of treatment have been discussed with the patient and family. After consideration of risks, benefits and other options for treatment, the patient has consented to  Procedure(s): TRANSESOPHAGEAL ECHOCARDIOGRAM (TEE) (N/A) as a surgical intervention.  The patient's history has been reviewed, patient examined, no change in status, stable for surgery.  I have reviewed the patient's chart and labs.  Questions were answered to the patient's satisfaction.     Dorris Carnes

## 2020-04-18 ENCOUNTER — Encounter (HOSPITAL_COMMUNITY): Payer: Self-pay | Admitting: Internal Medicine

## 2020-05-16 DIAGNOSIS — Q211 Atrial septal defect: Secondary | ICD-10-CM | POA: Insufficient documentation

## 2020-05-16 DIAGNOSIS — Q2111 Secundum atrial septal defect: Secondary | ICD-10-CM | POA: Insufficient documentation

## 2020-05-16 NOTE — Progress Notes (Signed)
Cardiology Office Note   Date:  05/18/2020   ID:  Arnelle, Nale [birth year], MRN [id number]  PCP:  Briscoe Deutscher, MD  Cardiologist:   Minus Breeding, MD   Chief Complaint  Patient presents with  . Shortness of Breath      History of Present Illness: Melissa Zhang is a 80 y.o. female who presents for follow up of valvular heart disease. Echo in 2018 demonstrated an EF of 50 - 55% with moderate MR and a PFO vs. ASD.    I saw her for the first time in December.  Follow up echo demonstrated that the that her EF was down to 45%.  I reviewed the images for this appt. I agree that her ejection fraction is slightly reduced compared to previous.   I sent her for a TEE and this was moderately severe.   I brought her back to talk about this.  Her shortness of breath has been progressive 1 year to the next.  She is short of breath doing mild activities around the house.  She is not describing PND or orthopnea.  She is not having any chest pressure, neck or arm discomfort.  She has had no weight gain or edema.  She really did not talk about that shortness of breath is much at the last visit but when pressed she has progressively gotten more short of breath.  Past Medical History:  Diagnosis Date  . Allergic rhinitis   . Esophageal reflux   . Hypertension   . Low back pain   . Mixed hyperlipidemia   . Obstructive sleep apnea   . Peripheral neuropathy   . Pneumonia    history  . Type 2 diabetes mellitus ([place])     Past Surgical History:  Procedure Laterality Date  . ABDOMINAL HYSTERECTOMY    . BUBBLE STUDY  04/17/2020   Procedure: BUBBLE STUDY;  Surgeon: Fay Records, MD;  Location: [place];  Service: Cardiovascular;;  . CHOLECYSTECTOMY    . COLONOSCOPY    . COLONOSCOPY WITH PROPOFOL N/A 05/07/2017   Procedure: COLONOSCOPY WITH PROPOFOL;  Surgeon: Ronald Lobo, MD;  Location: [facility] ENDOSCOPY;  Service: Endoscopy;  Laterality: N/A;  . CYSTECTOMY     LEFT BREAST  . EYE  SURGERY Bilateral    caratacts  . REPLACEMENT TOTAL KNEE BILATERAL    . REVERSE SHOULDER ARTHROPLASTY Right 09/29/2015   Procedure: RIGHT REVERSE TOTAL SHOULDER ARTHROPLASTY;  Surgeon: Netta Cedars, MD;  Location: [place];  Service: Orthopedics;  Laterality: Right;  . TEE WITHOUT CARDIOVERSION N/A 04/17/2020   Procedure: TRANSESOPHAGEAL ECHOCARDIOGRAM (TEE);  Surgeon: Fay Records, MD;  Location: [facility] ENDOSCOPY;  Service: Cardiovascular;  Laterality: N/A;     Current Outpatient Medications  Medication Sig Dispense Refill  . acetaminophen (TYLENOL) 650 MG CR tablet Take 1,300 mg by mouth every 8 (eight) hours as needed for pain.    . bisoprolol (ZEBETA) 10 MG tablet Take 1 tablet (10 mg total) by mouth daily. 90 tablet 3  . Dulaglutide (TRULICITY) 5.62 ZH/0.8MV SOPN Inject 0.75 mg as directed once a week. Tuesday    . DULoxetine (CYMBALTA) 60 MG capsule Take 60 mg by mouth daily.    Marland Kitchen gabapentin (NEURONTIN) 300 MG capsule Take 300 mg by mouth at bedtime.    Mar[place] Kitchen glimepiride (AMARYL) 4 MG tablet Take 4 mg by mouth 2 (two) times daily.    . hydrochlorothiazide (MICROZIDE) 12.5 MG capsule Take 1 capsule (12.5 mg total) by mouth  daily. 90 capsule 3  . isosorbide-hydrALAZINE (BIDIL) 20-37.5 MG tablet Take 1 tablet by mouth 3 (three) times daily. 90 tablet 6  . LANTUS SOLOSTAR 100 UNIT/ML Solostar Pen Inject 55 Units into the skin at bedtime.    Marland Kitchen levothyroxine (SYNTHROID, LEVOTHROID) 50 MCG tablet Take 50 mcg by mouth daily.    Marland Kitchen lovastatin (MEVACOR) 40 MG tablet Take 40 mg by mouth at bedtime.    . metFORMIN (GLUCOPHAGE) 500 MG tablet Take 500 mg by mouth 2 (two) times daily with a meal.    . MYRBETRIQ 25 MG TB24 tablet Take 25 mg by mouth daily.    . Probiotic Product (ALIGN) 4 MG CAPS Take 4 mg by mouth daily.     No current facility-administered medications for this visit.    Allergies:   Lisinopril, Actos [pioglitazone], Losartan, and Ciprofloxacin    ROS:  Please see the history of present  illness.   Otherwise, review of systems are positive for none.   All other systems are reviewed and negative.    PHYSICAL EXAM: VS:  BP (!) 144/80   Pulse 74   Ht 5\' 2"  (1.575 m)   Wt 262 lb (118.8 kg)   BMI 47.92 kg/m  , BMI Body mass index is 47.92 kg/m.  GENERAL:  Well appearing NECK:  No jugular venous distention, waveform within normal limits, carotid upstroke brisk and symmetric, no bruits, no thyromegaly LUNGS:  Clear to auscultation bilaterally CHEST:  Unremarkable HEART:  PMI not displaced or sustained,S1 and S2 within normal limits, no S3, no S4, no clicks, no rubs, 3 out of 6 apical and axillary holosystolic murmur, no diastolic murmurs ABD:  Flat, positive bowel sounds normal in frequency in pitch, no bruits, no rebound, no guarding, no midline pulsatile mass, no hepatomegaly, no splenomegaly EXT:  2 plus pulses throughout, no edema, no cyanosis no clubbing   EKG:  EKG is  ordered today. Sinus rhythm, rate 74, right axis deviation, interventricular conduction delay, left posterior fascicular block  Recent Labs: 01/19/2020: BUN 17; Creatinine, Ser 1.06; Hemoglobin 12.7; Platelets 285; Potassium 4.6; Sodium 135    Lipid Panel No results found for: CHOL, TRIG, HDL, CHOLHDL, VLDL, LDLCALC, LDLDIRECT    Wt Readings from Last 3 Encounters:  05/17/20 262 lb (118.8 kg)  04/17/20 261 lb (118.4 kg)  04/12/20 261 lb (118.4 kg)      Other studies Reviewed: Additional studies/ records that were reviewed today include:   TEE images reviewed personally in the office Review of the above records demonstrates:  Please see elsewhere in the note.     ASSESSMENT AND PLAN:  MR:    She had a TEE and this suggested that the MR was moderate to moderately severe..  Her ejection fraction is reduced compared to previous.  Very difficult to sort out her symptoms.  I am going to check a BNP level.  I had a long conversation with her and I am suggesting that we should get a right and left  heart catheterization and consider her case in the Structural Heart Disease Forum.  The patient understands that risks included but are not limited to stroke (1 in 1000), death (1 in 85), kidney failure [usually temporary] (1 in 500), bleeding (1 in 200), allergic reaction [possibly serious] (1 in 200).  The patient understands and agrees to proceed.   TR:  This was moderate.  This will be followed as above.  Cardiomyopathy:  Her EF was 45% at the last visit.  I would like to start Bidil since I am very leery of starting ACE or ARB given previous anaphylaxis.    Morbid obesity:    We had a long discussion about the need to lose weight.  This certainly could be contributing to her symptoms.    Grade II diastolic dysfunction:  I will follow this with the BNP.  I will be titrating her meds for blood pressure control.  She is to use low salt.   Small secundum ASD:  This was noted on TEE.    Sleep apnea:   She has not tolerated CPAP.    Essential HTN:   This will be treated in the context of managing her cardiomyopathy.    Current medicines are reviewed at length with the patient today.  The patient does not have concerns regarding medicines.  The following changes have been made: As above  Labs/ tests ordered today include:    Orders Placed This Encounter  Procedures  . CBC  . Basic metabolic panel  . Pro b natriuretic peptide (BNP)  . EKG 12-Lead     Disposition:   FU me after the cardiac cath   Signed, Minus Breeding, MD  05/18/2020 8:09 AM    [place]

## 2020-05-16 NOTE — H&P (View-Only) (Signed)
Cardiology Office Note   Date:  05/18/2020   ID:  Gift, Rueckert 1940/10/04, MRN [id number]  PCP:  Briscoe Deutscher, MD  Cardiologist:   Minus Breeding, MD   Chief Complaint  Patient presents with  . Shortness of Breath      History of Present Illness: Melissa Zhang is a 80 y.o. female who presents for follow up of valvular heart disease. Echo in 2018 demonstrated an EF of 50 - 55% with moderate MR and a PFO vs. ASD.    I saw her for the first time in December.  Follow up echo demonstrated that the that her EF was down to 45%.  I reviewed the images for this appt. I agree that her ejection fraction is slightly reduced compared to previous.   I sent her for a TEE and this was moderately severe.   I brought her back to talk about this.  Her shortness of breath has been progressive 1 year to the next.  She is short of breath doing mild activities around the house.  She is not describing PND or orthopnea.  She is not having any chest pressure, neck or arm discomfort.  She has had no weight gain or edema.  She really did not talk about that shortness of breath is much at the last visit but when pressed she has progressively gotten more short of breath.  Past Medical History:  Diagnosis Date  . Allergic rhinitis   . Esophageal reflux   . Hypertension   . Low back pain   . Mixed hyperlipidemia   . Obstructive sleep apnea   . Peripheral neuropathy   . Pneumonia    history  . Type 2 diabetes mellitus ([place])     Past Surgical History:  Procedure Laterality Date  . ABDOMINAL HYSTERECTOMY    . BUBBLE STUDY  04/17/2020   Procedure: BUBBLE STUDY;  Surgeon: Fay Records, MD;  Location: [facility];  Service: Cardiovascular;;  . CHOLECYSTECTOMY    . COLONOSCOPY    . COLONOSCOPY WITH PROPOFOL N/A 05/07/2017   Procedure: COLONOSCOPY WITH PROPOFOL;  Surgeon: Ronald Lobo, MD;  Location: [facility];  Service: Endoscopy;  Laterality: N/A;  . CYSTECTOMY     LEFT BREAST  . EYE  SURGERY Bilateral    caratacts  . REPLACEMENT TOTAL KNEE BILATERAL    . REVERSE SHOULDER ARTHROPLASTY Right 09/29/2015   Procedure: RIGHT REVERSE TOTAL SHOULDER ARTHROPLASTY;  Surgeon: Netta Cedars, MD;  Location: [place];  Service: Orthopedics;  Laterality: Right;  . TEE WITHOUT CARDIOVERSION N/A 04/17/2020   Procedure: TRANSESOPHAGEAL ECHOCARDIOGRAM (TEE);  Surgeon: Fay Records, MD;  Location: [facility] ENDOSCOPY;  Service: Cardiovascular;  Laterality: N/A;     Current Outpatient Medications  Medication Sig Dispense Refill  . acetaminophen (TYLENOL) 650 MG CR tablet Take 1,300 mg by mouth every 8 (eight) hours as needed for pain.    . bisoprolol (ZEBETA) 10 MG tablet Take 1 tablet (10 mg total) by mouth daily. 90 tablet 3  . Dulaglutide (TRULICITY) 7.78 EU/2.3NT SOPN Inject 0.75 mg as directed once a week. Tuesday    . DULoxetine (CYMBALTA) 60 MG capsule Take 60 mg by mouth daily.    Marland Kitchen gabapentin (NEURONTIN) 300 MG capsule Take 300 mg by mouth at bedtime.    Marland Kitchen glimepiride (AMARYL) 4 MG tablet Take 4 mg by mouth 2 (two) times daily.    . hydrochlorothiazide (MICROZIDE) 12.5 MG capsule Take 1 capsule (12.5 mg total) by mouth  daily. 90 capsule 3  . isosorbide-hydrALAZINE (BIDIL) 20-37.5 MG tablet Take 1 tablet by mouth 3 (three) times daily. 90 tablet 6  . LANTUS SOLOSTAR 100 UNIT/ML Solostar Pen Inject 55 Units into the skin at bedtime.    Marland Kitchen levothyroxine (SYNTHROID, LEVOTHROID) 50 MCG tablet Take 50 mcg by mouth daily.    Marland Kitchen lovastatin (MEVACOR) 40 MG tablet Take 40 mg by mouth at bedtime.    . metFORMIN (GLUCOPHAGE) 500 MG tablet Take 500 mg by mouth 2 (two) times daily with a meal.    . MYRBETRIQ 25 MG TB24 tablet Take 25 mg by mouth daily.    . Probiotic Product (ALIGN) 4 MG CAPS Take 4 mg by mouth daily.     No current facility-administered medications for this visit.    Allergies:   Lisinopril, Actos [pioglitazone], Losartan, and Ciprofloxacin    ROS:  Please see the history of present  illness.   Otherwise, review of systems are positive for none.   All other systems are reviewed and negative.    PHYSICAL EXAM: VS:  BP (!) 144/80   Pulse 74   Ht 5\' 2"  (1.575 m)   Wt 262 lb (118.8 kg)   BMI 47.92 kg/m  , BMI Body mass index is 47.92 kg/m.  GENERAL:  Well appearing NECK:  No jugular venous distention, waveform within normal limits, carotid upstroke brisk and symmetric, no bruits, no thyromegaly LUNGS:  Clear to auscultation bilaterally CHEST:  Unremarkable HEART:  PMI not displaced or sustained,S1 and S2 within normal limits, no S3, no S4, no clicks, no rubs, 3 out of 6 apical and axillary holosystolic murmur, no diastolic murmurs ABD:  Flat, positive bowel sounds normal in frequency in pitch, no bruits, no rebound, no guarding, no midline pulsatile mass, no hepatomegaly, no splenomegaly EXT:  2 plus pulses throughout, no edema, no cyanosis no clubbing   EKG:  EKG is  ordered today. Sinus rhythm, rate 74, right axis deviation, interventricular conduction delay, left posterior fascicular block  Recent Labs: 01/19/2020: BUN 17; Creatinine, Ser 1.06; Hemoglobin 12.7; Platelets 285; Potassium 4.6; Sodium 135    Lipid Panel No results found for: CHOL, TRIG, HDL, CHOLHDL, VLDL, LDLCALC, LDLDIRECT    Wt Readings from Last 3 Encounters:  05/17/20 262 lb (118.8 kg)  04/17/20 261 lb (118.4 kg)  04/12/20 261 lb (118.4 kg)      Other studies Reviewed: Additional studies/ records that were reviewed today include:   TEE images reviewed personally in the office Review of the above records demonstrates:  Please see elsewhere in the note.     ASSESSMENT AND PLAN:  MR:    She had a TEE and this suggested that the MR was moderate to moderately severe..  Her ejection fraction is reduced compared to previous.  Very difficult to sort out her symptoms.  I am going to check a BNP level.  I had a long conversation with her and I am suggesting that we should get a right and left  heart catheterization and consider her case in the Structural Heart Disease Forum.  The patient understands that risks included but are not limited to stroke (1 in 1000), death (1 in 53), kidney failure [usually temporary] (1 in 500), bleeding (1 in 200), allergic reaction [possibly serious] (1 in 200).  The patient understands and agrees to proceed.   TR:  This was moderate.  This will be followed as above.  Cardiomyopathy:  Her EF was 45% at the last visit.  I would like to start Bidil since I am very leery of starting ACE or ARB given previous anaphylaxis.    Morbid obesity:    We had a long discussion about the need to lose weight.  This certainly could be contributing to her symptoms.    Grade II diastolic dysfunction:  I will follow this with the BNP.  I will be titrating her meds for blood pressure control.  She is to use low salt.   Small secundum ASD:  This was noted on TEE.    Sleep apnea:   She has not tolerated CPAP.    Essential HTN:   This will be treated in the context of managing her cardiomyopathy.    Current medicines are reviewed at length with the patient today.  The patient does not have concerns regarding medicines.  The following changes have been made: As above  Labs/ tests ordered today include:    Orders Placed This Encounter  Procedures  . CBC  . Basic metabolic panel  . Pro b natriuretic peptide (BNP)  . EKG 12-Lead     Disposition:   FU me after the cardiac cath   Signed, Minus Breeding, MD  05/18/2020 8:09 AM    [place]

## 2020-05-17 ENCOUNTER — Encounter: Payer: Self-pay | Admitting: Cardiology

## 2020-05-17 ENCOUNTER — Ambulatory Visit: Payer: Medicare HMO | Admitting: Cardiology

## 2020-05-17 ENCOUNTER — Other Ambulatory Visit: Payer: Self-pay

## 2020-05-17 VITALS — BP 144/80 | HR 74 | Ht 62.0 in | Wt 262.0 lb

## 2020-05-17 DIAGNOSIS — I5189 Other ill-defined heart diseases: Secondary | ICD-10-CM

## 2020-05-17 DIAGNOSIS — I10 Essential (primary) hypertension: Secondary | ICD-10-CM

## 2020-05-17 DIAGNOSIS — Z01812 Encounter for preprocedural laboratory examination: Secondary | ICD-10-CM | POA: Diagnosis not present

## 2020-05-17 DIAGNOSIS — Q211 Atrial septal defect: Secondary | ICD-10-CM | POA: Diagnosis not present

## 2020-05-17 DIAGNOSIS — I420 Dilated cardiomyopathy: Secondary | ICD-10-CM

## 2020-05-17 DIAGNOSIS — I340 Nonrheumatic mitral (valve) insufficiency: Secondary | ICD-10-CM | POA: Diagnosis not present

## 2020-05-17 DIAGNOSIS — Q2111 Secundum atrial septal defect: Secondary | ICD-10-CM

## 2020-05-17 DIAGNOSIS — R0602 Shortness of breath: Secondary | ICD-10-CM

## 2020-05-17 MED ORDER — ISOSORB DINITRATE-HYDRALAZINE 20-37.5 MG PO TABS: 1.0000 | ORAL_TABLET | Freq: Three times a day (TID) | ORAL | 6 refills | Status: DC

## 2020-05-17 NOTE — Patient Instructions (Signed)
Medication Instructions:  Please start Bidil one tablet three times a day. Continue all other medications as listed.  *If you need a refill on your cardiac medications before your next appointment, please call your pharmacy*  Lab Work: Please have blood work at your PCP office (written order given)  CBC, BMP, Pro-BNP.  If you have labs (blood work) drawn today and your tests are completely normal, you will receive your results only by: Marland Kitchen MyChart Message (if you have MyChart) OR . A paper copy in the mail If you have any lab test that is abnormal or we need to change your treatment, we will call you to review the results.  Covid screening as scheduled - [place], [place], [place]  Testing/Procedures:   [place] [address] Dept: [phone] Loc: [phone]  Melissa Zhang  05/17/2020  You are scheduled for a Cardiac Cath on Monday May 29, 2020 with Dr. [name].  1. Please arrive at the [facility] (Main Entrance A) at [facility]: [address] [place], [place] [address] at 8:30 am (two hours before your procedure to ensure your preparation). Free valet parking service is available.   Special note: Every effort is made to have your procedure done on time. Please understand that emergencies sometimes delay scheduled procedures.  2. Diet: Nothing after midnight  3. Labs: At PCP office no more than 1 week before.  4. Medication instructions in preparation for your procedure:  Hold Metformin, Glimepiride this AM.    You may take your other medications as listed unless called and given other instructions.   You may use sips of water.  5. Plan for one night stay--bring personal belongings. 6. Bring a current list of your medications and current insurance cards. 7. You MUST have a responsible person to drive you home. 8. Someone MUST be with you  the first 24 hours after you arrive home or your discharge will be delayed. 9. Please wear clothes that are easy to get on and off and wear slip-on shoes.  Thank you for allowing [place] to care for you!   -- [facility]  Follow-Up: At [facility], you and your health needs are our priority.  As part of our continuing mission to provide you with exceptional heart care, we have created designated Provider Care Teams.  These Care Teams include your primary Cardiologist (physician) and Advanced Practice Providers (APPs -  Physician Assistants and Nurse Practitioners) who all work together to provide you with the care you need, when you need it.  We recommend signing up for the patient portal called "MyChart".  Sign up information is provided on this After Visit Summary.  MyChart is used to connect with patients for Virtual Visits (Telemedicine).  Patients are able to view lab/test results, encounter notes, upcoming appointments, etc.  Non-urgent messages can be sent to your provider as well.   To learn more about what you can do with MyChart, go to [email or web address].    Your next appointment:   Follow up with Dr Percival Spanish after your cardiac cath.  Thank you for choosing [facility]!!

## 2020-05-18 ENCOUNTER — Encounter: Payer: Self-pay | Admitting: Cardiology

## 2020-05-19 NOTE — Progress Notes (Signed)
Melissa Zhang DOB 04/04/1940  This looks like a clippable valve. The fossa looks approachable for transseptal puncture in both the SAXB and Bicaval views. TR is noted. The left atrium is large enough for device steering and straddle. The MR is anteriorly directed. The posterior leaflet is restricted and measures 1 cm in the 164 LVOT view. I was unable to measure MVA or gradient and I would like to verify both prior to the case. Based on this information, I'd recommend starting with an NTW and assessing for gradient.

## 2020-05-22 ENCOUNTER — Telehealth: Payer: Self-pay | Admitting: Cardiovascular Disease

## 2020-05-22 NOTE — Telephone Encounter (Signed)
Pt c/o medication issue:  1. Name of Medication: isosorbide-hydrALAZINE (BIDIL) 20-37.5 MG tablet [[id number]]    2. How are you currently taking this medication (dosage and times per day)? isosorbide-hydrALAZINE (BIDIL) 20-37.5 MG tablet [[id number]]    3. Are you having a reaction (difficulty breathing--STAT)? Na   4. What is your medication issue?  Pt called in and stated that this med is $275.00 and she stated she can not afford this med .  She would like to know is there anything cheaper she would be able to take ?     Best number [phone]

## 2020-05-22 NOTE — Telephone Encounter (Signed)
Tried to call patient, unable to leave message

## 2020-05-23 ENCOUNTER — Telehealth: Payer: Self-pay | Admitting: Physician Assistant

## 2020-05-23 NOTE — Telephone Encounter (Signed)
  [facility]   MULTIDISCIPLINARY HEART VALVE TEAM  Patient's TEE was reviewed by our multidisciplinary valve team including advanced structural imagers. Her MR was not felt to be severe by TEE assessment on 04/17/20. She does have dyspnea on exertion per review of last office note but also has G2DD and BMI ~48.   She is set up for [facility] cath with Dr. Burt Knack on 05/29/20. If this is only for mitral valve work up, we may want to consider cancelling it. Will route to Dr. Warren Lacy for further direction.   Angelena Form PA-C  [facility]

## 2020-05-23 NOTE — Telephone Encounter (Signed)
Reviewed notes. [facility] referring for R/L heart cath for progressive dyspnea. Will eval MR, CAD, right heart pressures, etc. Structural imagers all reviewed TEE and felt MR was in moderate range, so doubt we will pursue mitral valve intervention.

## 2020-05-23 NOTE — Telephone Encounter (Signed)
Unable to reach pt or leave a message phone just rings

## 2020-05-23 NOTE — Telephone Encounter (Signed)
She is significantly SOB and has reduced EF so I would still ask for right and left heart cath.

## 2020-05-24 ENCOUNTER — Other Ambulatory Visit: Payer: Self-pay | Admitting: Cardiology

## 2020-05-24 NOTE — Telephone Encounter (Signed)
I would suggest hydralazine 25 mg po tid and Imdur 20 mg po tid.

## 2020-05-24 NOTE — Telephone Encounter (Signed)
Returned call to patient who states the cost of Bidil is $275 and she cannot afford it. I advised that we can send the medications in as separate prescriptions, which should reduce the cost. Advised that I will send to Dr. Percival Spanish for verification if he wants her to start with hydralazine 37.5 mg (which would be 1 1/2 25 mg tabs) and isosorbide dinitrate 20 mg daily or start with hydralazine 25 mg and isosorbide 20 mg three times daily. Advised patient that once that clarification is made, we will send the medications to the [facility] listed in her chart. She verbalized understanding and agreement and thanked me for the call.

## 2020-05-25 ENCOUNTER — Telehealth: Payer: Self-pay | Admitting: *Deleted

## 2020-05-25 LAB — CBC WITH DIFFERENTIAL/PLATELET
Basophils Absolute: 0.1 10*3/uL (ref 0.0–0.2)
Basos: 1 %
EOS (ABSOLUTE): 0.2 10*3/uL (ref 0.0–0.4)
Eos: 2 %
Hematocrit: 42.2 % (ref 34.0–46.6)
Hemoglobin: 14 g/dL (ref 11.1–15.9)
Immature Grans (Abs): 0 10*3/uL (ref 0.0–0.1)
Immature Granulocytes: 0 %
Lymphocytes Absolute: 2.4 10*3/uL (ref 0.7–3.1)
Lymphs: 26 %
MCH: 30.2 pg (ref 26.6–33.0)
MCHC: 33.2 g/dL (ref 31.5–35.7)
MCV: 91 fL (ref 79–97)
Monocytes Absolute: 0.9 10*3/uL (ref 0.1–0.9)
Monocytes: 9 %
Neutrophils Absolute: 5.7 10*3/uL (ref 1.4–7.0)
Neutrophils: 62 %
Platelets: 309 10*3/uL (ref 150–450)
RBC: 4.63 x10E6/uL (ref 3.77–5.28)
RDW: 13.9 % (ref 11.7–15.4)
WBC: 9.3 10*3/uL (ref 3.4–10.8)

## 2020-05-25 LAB — BASIC METABOLIC PANEL (7)
BUN/Creatinine Ratio: 13 (ref 12–28)
BUN: 16 mg/dL (ref 8–27)
CO2: 22 mmol/L (ref 20–29)
Chloride: 99 mmol/L (ref 96–106)
Creatinine, Ser: 1.25 mg/dL — ABNORMAL HIGH (ref 0.57–1.00)
Glucose: 99 mg/dL (ref 65–99)
Potassium: 5.4 mmol/L — ABNORMAL HIGH (ref 3.5–5.2)
Sodium: 137 mmol/L (ref 134–144)
eGFR: 44 mL/min/{1.73_m2} — ABNORMAL LOW (ref >59)

## 2020-05-25 LAB — PRO B NATRIURETIC PEPTIDE: NT-Pro BNP: 802 pg/mL — ABNORMAL HIGH (ref 0–738)

## 2020-05-25 MED ORDER — HYDRALAZINE HCL 25 MG PO TABS: 25.0000 mg | ORAL_TABLET | Freq: Three times a day (TID) | ORAL | 3 refills | Status: DC

## 2020-05-25 MED ORDER — ISOSORBIDE DINITRATE 20 MG PO TABS: 20.0000 mg | ORAL_TABLET | Freq: Three times a day (TID) | ORAL | 3 refills | Status: DC

## 2020-05-25 NOTE — Telephone Encounter (Signed)
Pt contacted pre-catheterization scheduled at [facility] for: Monday May 29, 2020 10:30 AM Verified arrival time and place: [place]) at: 8:30 AM   No solid food after midnight prior to cath, clear liquids until 5 AM day of procedure.  Hold: Metformin-day of procedure and 48 hours post procedure Glimepiride-AM of procedure Insulin-1/2 usual dose HS prior to procedure HCTZ-AM of procedure  Except hold medications AM meds can be  taken pre-cath with sips of water including: ASA 81 mg   Confirmed patient has responsible adult to drive home post procedure and be with patient first 24 hours after arriving home: yes  You are allowed ONE visitor in the waiting room during the time you are at the hospital for your procedure. Both you and your visitor must wear a mask once you enter the hospital.   Reviewed procedure/mask/visitor instructions with patient.

## 2020-05-25 NOTE — Telephone Encounter (Signed)
Advised patient, verbalized understanding  Discussed Imdur dose with Alena Bills D and should be Isosorbide din

## 2020-05-26 ENCOUNTER — Other Ambulatory Visit (HOSPITAL_COMMUNITY)
Admission: RE | Admit: 2020-05-26 | Discharge: 2020-05-26 | Disposition: A | Discharge status: Home / Self Care | Payer: Medicare HMO | Source: Ambulatory Visit | Attending: Cardiovascular Disease | Admitting: Cardiovascular Disease

## 2020-05-26 DIAGNOSIS — Z20822 Contact with and (suspected) exposure to covid-19: Secondary | ICD-10-CM | POA: Diagnosis not present

## 2020-05-26 DIAGNOSIS — Z01812 Encounter for preprocedural laboratory examination: Secondary | ICD-10-CM | POA: Insufficient documentation

## 2020-05-26 LAB — SARS CORONAVIRUS 2 (TAT 6-24 HRS): SARS Coronavirus 2: NEGATIVE

## 2020-05-26 NOTE — Telephone Encounter (Signed)
05/24/20 GFR 44-per [facility] guideline, GFR < 45,  I have arranged pre-procedure hydration prior to [facility] 05/29/20, standard weight based IV flow rate for hydration.   I spoke with patient, discussed pre-procedure hydration, patient knows to  arrive 5:30 AM Monday 05/29/20 for hydration prior to [facility] 10:30 AM.

## 2020-05-29 ENCOUNTER — Encounter (HOSPITAL_COMMUNITY)
Admission: RE | Disposition: A | Discharge status: Home / Self Care | Payer: Self-pay | Source: Home / Self Care | Attending: Cardiovascular Disease

## 2020-05-29 ENCOUNTER — Other Ambulatory Visit: Payer: Self-pay

## 2020-05-29 ENCOUNTER — Ambulatory Visit (HOSPITAL_COMMUNITY)
Admission: RE | Admit: 2020-05-29 | Discharge: 2020-05-29 | Disposition: A | Discharge status: Home / Self Care | Payer: Medicare HMO | Attending: Cardiovascular Disease | Admitting: Cardiovascular Disease

## 2020-05-29 DIAGNOSIS — Z888 Allergy status to other drugs, medicaments and biological substances status: Secondary | ICD-10-CM | POA: Insufficient documentation

## 2020-05-29 DIAGNOSIS — Q211 Atrial septal defect: Secondary | ICD-10-CM | POA: Insufficient documentation

## 2020-05-29 DIAGNOSIS — I272 Pulmonary hypertension, unspecified: Secondary | ICD-10-CM

## 2020-05-29 DIAGNOSIS — E1142 Type 2 diabetes mellitus with diabetic polyneuropathy: Secondary | ICD-10-CM | POA: Diagnosis not present

## 2020-05-29 DIAGNOSIS — I2510 Atherosclerotic heart disease of native coronary artery without angina pectoris: Secondary | ICD-10-CM | POA: Diagnosis not present

## 2020-05-29 DIAGNOSIS — I340 Nonrheumatic mitral (valve) insufficiency: Secondary | ICD-10-CM

## 2020-05-29 DIAGNOSIS — I10 Essential (primary) hypertension: Secondary | ICD-10-CM | POA: Diagnosis not present

## 2020-05-29 DIAGNOSIS — E782 Mixed hyperlipidemia: Secondary | ICD-10-CM | POA: Insufficient documentation

## 2020-05-29 DIAGNOSIS — Z96653 Presence of artificial knee joint, bilateral: Secondary | ICD-10-CM | POA: Insufficient documentation

## 2020-05-29 DIAGNOSIS — Z6841 Body Mass Index (BMI) 40.0 and over, adult: Secondary | ICD-10-CM | POA: Diagnosis not present

## 2020-05-29 DIAGNOSIS — Z79899 Other long term (current) drug therapy: Secondary | ICD-10-CM | POA: Insufficient documentation

## 2020-05-29 DIAGNOSIS — Z006 Encounter for examination for normal comparison and control in clinical research program: Secondary | ICD-10-CM

## 2020-05-29 DIAGNOSIS — R0602 Shortness of breath: Secondary | ICD-10-CM | POA: Diagnosis not present

## 2020-05-29 DIAGNOSIS — Z96611 Presence of right artificial shoulder joint: Secondary | ICD-10-CM | POA: Diagnosis not present

## 2020-05-29 DIAGNOSIS — E119 Type 2 diabetes mellitus without complications: Secondary | ICD-10-CM | POA: Diagnosis not present

## 2020-05-29 DIAGNOSIS — Z7984 Long term (current) use of oral hypoglycemic drugs: Secondary | ICD-10-CM | POA: Insufficient documentation

## 2020-05-29 DIAGNOSIS — Z881 Allergy status to other antibiotic agents status: Secondary | ICD-10-CM | POA: Insufficient documentation

## 2020-05-29 DIAGNOSIS — Z794 Long term (current) use of insulin: Secondary | ICD-10-CM | POA: Diagnosis not present

## 2020-05-29 DIAGNOSIS — Z79890 Hormone replacement therapy (postmenopausal): Secondary | ICD-10-CM | POA: Diagnosis not present

## 2020-05-29 DIAGNOSIS — Z9049 Acquired absence of other specified parts of digestive tract: Secondary | ICD-10-CM | POA: Insufficient documentation

## 2020-05-29 HISTORY — PX: RIGHT/LEFT HEART CATH AND CORONARY ANGIOGRAPHY: CATH118266

## 2020-05-29 LAB — POCT I-STAT EG7
Acid-base deficit: 2 mmol/L (ref 0.0–2.0)
Acid-base deficit: 3 mmol/L — ABNORMAL HIGH (ref 0.0–2.0)
Bicarbonate: 23.8 mmol/L (ref 20.0–28.0)
Bicarbonate: 24.9 mmol/L (ref 20.0–28.0)
Calcium, Ion: 1.15 mmol/L (ref 1.15–1.40)
Calcium, Ion: 1.24 mmol/L (ref 1.15–1.40)
HCT: 31 % — ABNORMAL LOW (ref 36.0–46.0)
HCT: 34 % — ABNORMAL LOW (ref 36.0–46.0)
Hemoglobin: 10.5 g/dL — ABNORMAL LOW (ref 12.0–15.0)
Hemoglobin: 11.6 g/dL — ABNORMAL LOW (ref 12.0–15.0)
O2 Saturation: 77 %
O2 Saturation: 77 %
Potassium: 4.4 mmol/L (ref 3.5–5.1)
Potassium: 4.6 mmol/L (ref 3.5–5.1)
Sodium: 139 mmol/L (ref 135–145)
Sodium: 139 mmol/L (ref 135–145)
TCO2: 25 mmol/L (ref 22–32)
TCO2: 26 mmol/L (ref 22–32)
pCO2, Ven: 51.2 mmHg (ref 44.0–60.0)
pCO2, Ven: 52.1 mmHg (ref 44.0–60.0)
pH, Ven: 7.267 (ref 7.250–7.430)
pH, Ven: 7.295 (ref 7.250–7.430)
pO2, Ven: 46 mmHg — ABNORMAL HIGH (ref 32.0–45.0)
pO2, Ven: 48 mmHg — ABNORMAL HIGH (ref 32.0–45.0)

## 2020-05-29 LAB — POCT I-STAT 7, (LYTES, BLD GAS, ICA,H+H)
Acid-base deficit: 3 mmol/L — ABNORMAL HIGH (ref 0.0–2.0)
Bicarbonate: 23.8 mmol/L (ref 20.0–28.0)
Calcium, Ion: 1.25 mmol/L (ref 1.15–1.40)
HCT: 34 % — ABNORMAL LOW (ref 36.0–46.0)
Hemoglobin: 11.6 g/dL — ABNORMAL LOW (ref 12.0–15.0)
O2 Saturation: 99 %
Potassium: 4.6 mmol/L (ref 3.5–5.1)
Sodium: 139 mmol/L (ref 135–145)
TCO2: 25 mmol/L (ref 22–32)
pCO2 arterial: 47.4 mmHg (ref 32.0–48.0)
pH, Arterial: 7.31 — ABNORMAL LOW (ref 7.350–7.450)
pO2, Arterial: 165 mmHg — ABNORMAL HIGH (ref 83.0–108.0)

## 2020-05-29 LAB — GLUCOSE, CAPILLARY
Glucose-Capillary: 177 mg/dL — ABNORMAL HIGH (ref 70–99)
Glucose-Capillary: 124 mg/dL — ABNORMAL HIGH (ref 70–99)

## 2020-05-29 SURGERY — RIGHT/LEFT HEART CATH AND CORONARY ANGIOGRAPHY
Anesthesia: LOCAL

## 2020-05-29 MED ORDER — SODIUM CHLORIDE 0.9 % IV SOLN: 250.0000 mL | INTRAVENOUS | Status: DC | PRN

## 2020-05-29 MED ORDER — ASPIRIN 81 MG PO CHEW: 81.0000 mg | CHEWABLE_TABLET | ORAL | Status: AC

## 2020-05-29 MED ORDER — MIDAZOLAM HCL 2 MG/2ML IJ SOLN
INTRAMUSCULAR | Status: DC | PRN
  Administered 2020-05-29: 2 mg via INTRAVENOUS

## 2020-05-29 MED ORDER — LIDOCAINE HCL (PF) 1 % IJ SOLN
INTRAMUSCULAR | Status: DC | PRN
Start: 2020-05-29 — End: 2020-05-29
  Administered 2020-05-29 (×2): 2 mL via SUBCUTANEOUS

## 2020-05-29 MED ORDER — HYDRALAZINE HCL 20 MG/ML IJ SOLN: 10.0000 mg | INTRAMUSCULAR | Status: DC | PRN

## 2020-05-29 MED ORDER — HEPARIN SODIUM (PORCINE) 1000 UNIT/ML IJ SOLN
INTRAMUSCULAR | Status: AC
  Filled 2020-05-29: qty 1

## 2020-05-29 MED ORDER — HEPARIN (PORCINE) IN NACL 1000-0.9 UT/500ML-% IV SOLN
INTRAVENOUS | Status: AC
  Filled 2020-05-29: qty 1000

## 2020-05-29 MED ORDER — SODIUM CHLORIDE 0.9 % WEIGHT BASED INFUSION
3.0000 mL/kg/h | INTRAVENOUS | Status: AC
  Administered 2020-05-29: 3 mL/kg/h via INTRAVENOUS

## 2020-05-29 MED ORDER — IOHEXOL 350 MG/ML SOLN
INTRAVENOUS | Status: DC | PRN
  Administered 2020-05-29: 50 mL

## 2020-05-29 MED ORDER — SODIUM CHLORIDE 0.9 % WEIGHT BASED INFUSION
1.0000 mL/kg/h | INTRAVENOUS | Status: DC
  Administered 2020-05-29: 1 mL/kg/h via INTRAVENOUS

## 2020-05-29 MED ORDER — SODIUM CHLORIDE 0.9% FLUSH: 3.0000 mL | Freq: Two times a day (BID) | INTRAVENOUS | Status: DC

## 2020-05-29 MED ORDER — HEPARIN (PORCINE) IN NACL 1000-0.9 UT/500ML-% IV SOLN
INTRAVENOUS | Status: DC | PRN
  Administered 2020-05-29: 500 mL

## 2020-05-29 MED ORDER — LABETALOL HCL 5 MG/ML IV SOLN: 10.0000 mg | INTRAVENOUS | Status: DC | PRN

## 2020-05-29 MED ORDER — SODIUM CHLORIDE 0.9% FLUSH: 3.0000 mL | INTRAVENOUS | Status: DC | PRN

## 2020-05-29 MED ORDER — FENTANYL CITRATE (PF) 100 MCG/2ML IJ SOLN
INTRAMUSCULAR | Status: DC | PRN
  Administered 2020-05-29: 25 ug via INTRAVENOUS

## 2020-05-29 MED ORDER — VERAPAMIL HCL 2.5 MG/ML IV SOLN
INTRAVENOUS | Status: DC | PRN
  Administered 2020-05-29: 10 mL via INTRA_ARTERIAL

## 2020-05-29 MED ORDER — MIDAZOLAM HCL 2 MG/2ML IJ SOLN
INTRAMUSCULAR | Status: AC
  Filled 2020-05-29: qty 2

## 2020-05-29 MED ORDER — ONDANSETRON HCL 4 MG/2ML IJ SOLN: 4.0000 mg | Freq: Four times a day (QID) | INTRAMUSCULAR | Status: DC | PRN

## 2020-05-29 MED ORDER — LIDOCAINE HCL (PF) 1 % IJ SOLN
INTRAMUSCULAR | Status: AC
  Filled 2020-05-29: qty 30

## 2020-05-29 MED ORDER — HEPARIN SODIUM (PORCINE) 1000 UNIT/ML IJ SOLN
INTRAMUSCULAR | Status: DC | PRN
  Administered 2020-05-29: 5000 [IU] via INTRAVENOUS

## 2020-05-29 MED ORDER — FENTANYL CITRATE (PF) 100 MCG/2ML IJ SOLN
INTRAMUSCULAR | Status: AC
  Filled 2020-05-29: qty 2

## 2020-05-29 MED ORDER — ACETAMINOPHEN 325 MG PO TABS: 650.0000 mg | ORAL_TABLET | ORAL | Status: DC | PRN

## 2020-05-29 MED ORDER — VERAPAMIL HCL 2.5 MG/ML IV SOLN
INTRAVENOUS | Status: AC
  Filled 2020-05-29: qty 2

## 2020-05-29 SURGICAL SUPPLY — 12 items
CATH 5FR JL3.5 JR4 ANG PIG MP (CATHETERS) ×2 IMPLANT
CATH INFINITI 5 FR AR1 MOD (CATHETERS) ×1 IMPLANT
CATH SWAN GANZ 7F STRAIGHT (CATHETERS) ×2 IMPLANT
DEVICE RAD COMP TR BAND LRG (VASCULAR PRODUCTS) ×1 IMPLANT
GLIDESHEATH SLEND SS 6F .021 (SHEATH) ×2 IMPLANT
GLIDESHEATH SLENDER 7FR .021G (SHEATH) ×2 IMPLANT
GUIDEWIRE INQWIRE 1.5J.035X260 (WIRE) ×1 IMPLANT
INQWIRE 1.5J .035X260CM (WIRE) ×2
KIT HEART LEFT (KITS) ×2 IMPLANT
PACK CARDIAC CATHETERIZATION (CUSTOM PROCEDURE TRAY) ×2 IMPLANT
TRANSDUCER W/STOPCOCK (MISCELLANEOUS) ×2 IMPLANT
TUBING CIL FLEX 10 FLL-RA (TUBING) ×2 IMPLANT

## 2020-05-29 NOTE — Discharge Instructions (Signed)
DRINK PLENTY OF FLUIDS OVER THE NEXT 2-3 DAYS.  Radial Site Care  This sheet gives you information about how to care for yourself after your procedure. Your health care provider may also give you more specific instructions. If you have problems or questions, contact your health care provider. What can I expect after the procedure? After the procedure, it is common to have:  Bruising and tenderness at the catheter insertion area. Follow these instructions at home: Medicines  Take over-the-counter and prescription medicines only as told by your health care provider. Insertion site care  Follow instructions from your health care provider about how to take care of your insertion site. Make sure you: ? Wash your hands with soap and water before you change your bandage (dressing). If soap and water are not available, use hand sanitizer. ? Change your dressing as told by your health care provider. ? Leave stitches (sutures), skin glue, or adhesive strips in place. These skin closures may need to stay in place for 2 weeks or longer. If adhesive strip edges start to loosen and curl up, you may trim the loose edges. Do not remove adhesive strips completely unless your health care provider tells you to do that.  Check your insertion site every day for signs of infection. Check for: ? Redness, swelling, or pain. ? Fluid or blood. ? Pus or a bad smell. ? Warmth.  Do not take baths, swim, or use a hot tub until your health care provider approves.  You may shower 24-48 hours after the procedure, or as directed by your health care provider. ? Remove the dressing and gently wash the site with plain soap and water. ? Pat the area dry with a clean towel. ? Do not rub the site. That could cause bleeding.  Do not apply powder or lotion to the site. Activity  For 24 hours after the procedure, or as directed by your health care provider: ? Do not flex or bend the affected arm. ? Do not push or pull  heavy objects with the affected arm. ? Do not drive yourself home from the hospital or clinic. You may drive 24 hours after the procedure unless your health care provider tells you not to. ? Do not operate machinery or power tools.  Do not lift anything that is heavier than 10 lb (4.5 kg), or the limit that you are told, until your health care provider says that it is safe.  Ask your health care provider when it is okay to: ? Return to work or school. ? Resume usual physical activities or sports. ? Resume sexual activity.   General instructions  If the catheter site starts to bleed, raise your arm and put firm pressure on the site. If the bleeding does not stop, get help right away. This is a medical emergency.  If you went home on the same day as your procedure, a responsible adult should be with you for the first 24 hours after you arrive home.  Keep all follow-up visits as told by your health care provider. This is important. Contact a health care provider if:  You have a fever.  You have redness, swelling, or yellow drainage around your insertion site. Get help right away if:  You have unusual pain at the radial site.  The catheter insertion area swells very fast.  The insertion area is bleeding, and the bleeding does not stop when you hold steady pressure on the area.  Your arm or hand becomes pale,   cool, tingly, or numb. These symptoms may represent a serious problem that is an emergency. Do not wait to see if the symptoms will go away. Get medical help right away. Call your local emergency services (911 in the [place] Do not drive yourself to the hospital. Summary  After the procedure, it is common to have bruising and tenderness at the site.  Follow instructions from your health care provider about how to take care of your radial site wound. Check the wound every day for signs of infection.  Do not lift anything that is heavier than 10 lb (4.5 kg), or the limit that you  are told, until your health care provider says that it is safe. This information is not intended to replace advice given to you by your health care provider. Make sure you discuss any questions you have with your health care provider. Document Revised: 03/12/2017 Document Reviewed: 03/12/2017 Elsevier Patient Education  2021 Elsevier Inc.  

## 2020-05-29 NOTE — Research (Signed)
[place] Informed Consent                  Subject Name: Melissa Zhang    Subject met inclusion and exclusion criteria.  The informed consent form, study requirements and expectations were reviewed with the subject and questions and concerns were addressed prior to the signing of the consent form.  The subject verbalized understanding of the trial requirements.  The subject agreed to participate in the [facility] trial and signed the informed consent at 08:18AM on 05/29/20.  The informed consent was obtained prior to performance of any protocol-specific procedures for the subject.  A copy of the signed informed consent was given to the subject and a copy was placed in the subject's medical record.   Meade Maw , Naval architect

## 2020-05-29 NOTE — Interval H&P Note (Signed)
History and Physical Interval Note:  05/29/2020 11:10 AM  Melissa Zhang  has presented today for surgery, with the diagnosis of SOB.  The various methods of treatment have been discussed with the patient and family. After consideration of risks, benefits and other options for treatment, the patient has consented to  Procedure(s): RIGHT/LEFT HEART CATH AND CORONARY ANGIOGRAPHY (N/A) as a surgical intervention.  The patient's history has been reviewed, patient examined, no change in status, stable for surgery.  I have reviewed the patient's chart and labs.  Questions were answered to the patient's satisfaction.     [name]

## 2020-05-30 ENCOUNTER — Encounter (HOSPITAL_COMMUNITY): Payer: Self-pay | Admitting: Cardiovascular Disease

## 2020-05-31 ENCOUNTER — Telehealth: Payer: Self-pay | Admitting: Cardiology

## 2020-05-31 NOTE — Telephone Encounter (Signed)
PT is calling to confirm if she needed to set up a f/u appt with Dr.Hochrein after her procedure she had done on 05/29/2020. Please advise

## 2020-06-04 NOTE — Telephone Encounter (Signed)
When I have an open [place] appt.

## 2020-06-08 NOTE — Telephone Encounter (Signed)
Next available appointment schedule with Dr Percival Spanish in [place] on 07/06 @ 1:20 pm

## 2020-06-19 NOTE — Telephone Encounter (Signed)
Conclusion  1. Patent coronary arteries with mild nonobstructive plaquing noted in the LAD and RCA, no significant stenoses 2. Elevated right heart pressures with severe pulmonary HTN (PAP 75/24 mean 45 3. Elevated PCWP of 23 mmHg, V-wave 30 mmHg  Most significant issues seems to be pulmonary HTN. PVR is 3.24 Woods units, likely mixed picture (obesity, OSA, left heart disease). Pt reports OSA but can't tolerate CPAP. Further eval per Dr Percival Spanish.    The above information is from pt's recent cardiac cath.    Called and spoke with patient who would like to be seen by Dr Percival Spanish sooner.  She is willing to come to [place].  Office visit scheduled for [employer] on 5/18.2022.  Reviewed location of office with pt.  She was grateful for the call back and appointment.

## 2020-06-19 NOTE — Telephone Encounter (Signed)
Patient called concerned about what was going to be done since she had the heart cath.  She stated that she didn't want to wait until July to see Dr. Percival Spanish.

## 2020-07-04 DIAGNOSIS — I5032 Chronic diastolic (congestive) heart failure: Secondary | ICD-10-CM | POA: Insufficient documentation

## 2020-07-04 NOTE — Progress Notes (Signed)
Cardiology Office Note   Date:  07/05/2020   ID:  Melissa Zhang, Melissa Zhang 05-May-1940, MRN [id number]  PCP:  Aura Dials, MD  Cardiologist:   Minus Breeding, MD   Chief Complaint  Patient presents with  . Shortness of Breath      History of Present Illness: Melissa Zhang is a 80 y.o. female who presents for follow up of valvular heart disease. Echo in 2018 demonstrated an EF of 50 - 55% with moderate MR and a PFO vs. ASD.    I saw her for the first time in December.  Follow up echo demonstrated that the that her EF was down to 45%.  I reviewed the images for this appt. I agree that her ejection fraction is slightly reduced compared to previous.   I sent her for a TEE and this was moderately severe.    I sent her for a cardiac cath with non obstructive CAD and severe pulmonary HTN.    She continues to have multiple complaints with fatigue being the predominant 1.  She has continued lower extremity swelling.  She is dyspneic with mild exertion.  She is not having any new cardiovascular symptoms.  She denied having any new PND or orthopnea.  Has had no new chest pressure, neck or arm discomfort.  She is dyspneic doing mild things around the house.  Past Medical History:  Diagnosis Date  . Allergic rhinitis   . Esophageal reflux   . Hypertension   . Low back pain   . Mixed hyperlipidemia   . Obstructive sleep apnea   . Peripheral neuropathy   . Pneumonia    history  . Type 2 diabetes mellitus ([place])     Past Surgical History:  Procedure Laterality Date  . ABDOMINAL HYSTERECTOMY    . BUBBLE STUDY  04/17/2020   Procedure: BUBBLE STUDY;  Surgeon: Fay Records, MD;  Location: [facility];  Service: Cardiovascular;;  . CHOLECYSTECTOMY    . COLONOSCOPY    . COLONOSCOPY WITH PROPOFOL N/A 05/07/2017   Procedure: COLONOSCOPY WITH PROPOFOL;  Surgeon: Ronald Lobo, MD;  Location: [facility];  Service: Endoscopy;  Laterality: N/A;  . CYSTECTOMY     LEFT BREAST  . EYE SURGERY  Bilateral    caratacts  . REPLACEMENT TOTAL KNEE BILATERAL    . REVERSE SHOULDER ARTHROPLASTY Right 09/29/2015   Procedure: RIGHT REVERSE TOTAL SHOULDER ARTHROPLASTY;  Surgeon: Netta Cedars, MD;  Location: [place] [facility];  Service: Orthopedics;  Laterality: Right;  . RIGHT/LEFT HEART CATH AND CORONARY ANGIOGRAPHY N/A 05/29/2020   Procedure: RIGHT/LEFT HEART CATH AND CORONARY ANGIOGRAPHY;  Surgeon: [name], MD;  Location: [facility];  Service: Cardiovascular;  Laterality: N/A;  . TEE WITHOUT CARDIOVERSION N/A 04/17/2020   Procedure: TRANSESOPHAGEAL ECHOCARDIOGRAM (TEE);  Surgeon: Fay Records, MD;  Location: [facility] END[facility];  Service: Cardiovascular;  Laterality: N/A;     Current Outpatient Medications  Medication Sig Dispense Refill  . acetaminophen (TYLENOL) 650 MG CR tablet Take 1,300 mg by mouth every 8 (eight) hours as needed for pain.    . bisoprolol (ZEBETA) 10 MG tablet Take 1 tablet (10 mg total) by mouth daily. 90 tablet 3  . Dulaglutide (TRULICITY) 3.32 RJ/1.8AC SOPN Inject 0.75 mg as directed every Tuesday.    . DULoxetine (CYMBALTA) 60 MG capsule Take 60 mg by mouth daily.    . furosemide (LASIX) 20 MG tablet Take 1 tablet (20 mg total) by mouth daily. 90 tablet 3  .  gabapentin (NEURONTIN) 300 MG capsule Take 300 mg by mouth at bedtime.    Marland Kitchen glimepiride (AMARYL) 4 MG tablet Take 4 mg by mouth 2 (two) times daily.    . hydrALAZINE (APRESOLINE) 25 MG tablet Take 1 tablet (25 mg total) by mouth 3 (three) times daily. 270 tablet 3  . hydrochlorothiazide (MICROZIDE) 12.5 MG capsule Take 1 capsule (12.5 mg total) by mouth daily. 90 capsule 3  . isosorbide dinitrate (ISORDIL) 20 MG tablet Take 1 tablet (20 mg total) by mouth 3 (three) times daily. 270 tablet 3  . LANTUS SOLOSTAR 100 UNIT/ML Solostar Pen Inject 55 Units into the skin at bedtime.    Marland Kitchen levothyroxine (SYNTHROID, LEVOTHROID) 50 MCG tablet Take 50 mcg by mouth daily.    Marland Kitchen lovastatin (MEVACOR) 40 MG tablet Take 40 mg by  mouth at bedtime.    . metFORMIN (GLUCOPHAGE) 500 MG tablet Take 250 mg by mouth 2 (two) times daily with a meal.    . MYRBETRIQ 25 MG TB24 tablet Take 25 mg by mouth daily.    . Probiotic Product (ALIGN) 4 MG CAPS Take 4 mg by mouth daily.     No current facility-administered medications for this visit.    Allergies:   Lisinopril, Actos [pioglitazone], Losartan, and Ciprofloxacin    ROS:  Please see the history of present illness.   Otherwise, review of systems are positive for none.   All other systems are reviewed and negative.    PHYSICAL EXAM: VS:  BP (!) 150/83   Pulse 73   Ht 5\' 2"  (1.575 m)   Wt 265 lb (120.2 kg)   SpO2 90%   BMI 48.47 kg/m  , BMI Body mass index is 48.47 kg/m.  GENERAL:  Well appearing NECK:  No jugular venous distention, waveform within normal limits, carotid upstroke brisk and symmetric, no bruits, no thyromegaly LUNGS:  Clear to auscultation bilaterally CHEST:  Unremarkable HEART:  PMI not displaced or sustained,S1 and S2 within normal limits, no S3, no S4, no clicks, no rubs, 3/6 holosystolic murmur heard anteriorly, no diastolic murmurs ABD:  Flat, positive bowel sounds normal in frequency in pitch, no bruits, no rebound, no guarding, no midline pulsatile mass, no hepatomegaly, no splenomegaly EXT:  2 plus pulses throughout, moderate bilateral lower extremity edema edema, no cyanosis no clubbing  EKG:  EKG is not ordered today.  Recent Labs: 05/24/2020: BUN 16; Creatinine, Ser 1.25; NT-Pro BNP 802; Platelets 309 05/29/2020: Hemoglobin 10.5; Potassium 4.4; Sodium 139    Lipid Panel No results found for: CHOL, TRIG, HDL, CHOLHDL, VLDL, LDLCALC, LDLDIRECT    Wt Readings from Last 3 Encounters:  07/05/20 265 lb (120.2 kg)  05/29/20 260 lb (117.9 kg)  05/17/20 262 lb (118.8 kg)      Other studies Reviewed: Additional studies/ records that were reviewed today include:   Cardiac cath Review of the above records demonstrates:  Please see  elsewhere in the note.     ASSESSMENT AND PLAN:  MR:    She had a TEE and this suggested that the MR was moderate to moderately severe.Marland Kitchen   However, it likely does not explain her symptoms.  I am going to be managing this medically and as below.   Pulmonary HTN:  This is likely secondary to OSA and obesity.   I do not think there is a primary component.  Her Woods units for 3.24.  I had a very long discussion with her about this.  She needs weight loss.  She needs treatment of her sleep apnea.  I called her primary care doctor tell her about this conversation.  I am going to refer her back to pulmonary who she has not seen for years.  I am going to start a low-dose of diuretic although I have to be careful and have talked with her primary doctor about getting blood work in another couple of weeks to watch her renal function.  Cardiomyopathy:  Her EF was 45% at the last visit.  She had anaphylaxis in the past to ACE inhibitor so I am not going to even consider using an ARB in this situation.   She has been started on isosorbide and hydralazine and I will continue this without titration in the future.  I am going to give her low-dose Lasix as above.  Morbid obesity:    This is a very significant issue.  I talked with her very bluntly about this in the office today.  Her husband was there.  He says she eats a lot of ice cream.   Certainly weight loss is key to her doing any better in the future and we had this discussion.  We talked about avoiding carbohydrates.   Grade II diastolic dysfunction:   This will be managed as above.   Small secundum ASD:   This is an incidental finding.  No change in therapy.  Sleep apnea:   She does not tolerate CPAP.   I am going to set her up to see Dr. Halford Chessman and she is aware of the importance based on having this treated.   Essential HTN:  Slightly elevated but will be managed in the context of treating her systolic and diastolic heart failure.    Current  medicines are reviewed at length with the patient today.  The patient does not have concerns regarding medicines.  The following changes have been made: As above  Labs/ tests ordered today include:    Orders Placed This Encounter  Procedures  . Ambulatory referral to Pulmonology     Disposition:   FU me in about 3 months.    Signed, Minus Breeding, MD  07/05/2020 5:36 PM    [facility]

## 2020-07-05 ENCOUNTER — Encounter: Payer: Self-pay | Admitting: Cardiology

## 2020-07-05 ENCOUNTER — Ambulatory Visit: Payer: Medicare HMO | Admitting: Cardiology

## 2020-07-05 ENCOUNTER — Other Ambulatory Visit: Payer: Self-pay

## 2020-07-05 VITALS — BP 150/83 | HR 73 | Ht 62.0 in | Wt 265.0 lb

## 2020-07-05 DIAGNOSIS — I5032 Chronic diastolic (congestive) heart failure: Secondary | ICD-10-CM

## 2020-07-05 DIAGNOSIS — Q211 Atrial septal defect: Secondary | ICD-10-CM | POA: Diagnosis not present

## 2020-07-05 DIAGNOSIS — I340 Nonrheumatic mitral (valve) insufficiency: Secondary | ICD-10-CM | POA: Diagnosis not present

## 2020-07-05 DIAGNOSIS — G4733 Obstructive sleep apnea (adult) (pediatric): Secondary | ICD-10-CM

## 2020-07-05 DIAGNOSIS — I272 Pulmonary hypertension, unspecified: Secondary | ICD-10-CM

## 2020-07-05 DIAGNOSIS — Q2111 Secundum atrial septal defect: Secondary | ICD-10-CM

## 2020-07-05 MED ORDER — FUROSEMIDE 20 MG PO TABS: 20.0000 mg | ORAL_TABLET | Freq: Every day | ORAL | 3 refills | Status: DC

## 2020-07-05 NOTE — Patient Instructions (Signed)
Medication Instructions:  START FUROSEMIDE 20 MG DAILY   *If you need a refill on your cardiac medications before your next appointment, please call your pharmacy*  Lab Work: [facility] SPOKE WITH YOUR PRIMARY AND THEY WILL GET LABS Friday/NEXT WEEK   If you have labs (blood work) drawn today and your tests are completely normal, you will receive your results only by: Marland Kitchen MyChart Message (if you have MyChart) OR . A paper copy in the mail If you have any lab test that is abnormal or we need to change your treatment, we will call you to review the results.  Testing/Procedures: NONE   Follow-Up: At [facility], you and your health needs are our priority.  As part of our continuing mission to provide you with exceptional heart care, we have created designated Provider Care Teams.  These Care Teams include your primary Cardiologist (physician) and Advanced Practice Providers (APPs -  Physician Assistants and Nurse Practitioners) who all work together to provide you with the care you need, when you need it.  We recommend signing up for the patient portal called "MyChart".  Sign up information is provided on this After Visit Summary.  MyChart is used to connect with patients for Virtual Visits (Telemedicine).  Patients are able to view lab/test results, encounter notes, upcoming appointments, etc.  Non-urgent messages can be sent to your provider as well.   To learn more about what you can do with MyChart, go to [email or web address].    Your next appointment:   3 month(s)  The format for your next appointment:   In Person  Provider:   DR Loomis have been referred to [place]

## 2020-07-14 ENCOUNTER — Telehealth: Payer: Self-pay | Admitting: *Deleted

## 2020-07-14 NOTE — Telephone Encounter (Signed)
A message was left, re: her referral to Dr.Soods office on June 29 th. Appointment was mailed.

## 2020-08-16 ENCOUNTER — Ambulatory Visit (INDEPENDENT_AMBULATORY_CARE_PROVIDER_SITE_OTHER): Payer: Medicare HMO | Admitting: Pulmonary Disease

## 2020-08-16 ENCOUNTER — Other Ambulatory Visit: Payer: Self-pay

## 2020-08-16 ENCOUNTER — Encounter: Payer: Self-pay | Admitting: Pulmonary Disease

## 2020-08-16 VITALS — BP 130/80 | HR 80 | Temp 97.7°F | Ht 62.5 in | Wt 260.4 lb

## 2020-08-16 DIAGNOSIS — R0683 Snoring: Secondary | ICD-10-CM | POA: Diagnosis not present

## 2020-08-16 NOTE — Patient Instructions (Signed)
Will arrange for home sleep study Will call to arrange for follow up after sleep study reviewed  

## 2020-08-16 NOTE — Progress Notes (Signed)
Pulmonary, Critical Care, and Sleep Medicine  Chief Complaint  Patient presents with   Consult    Hx of OSA, did not tolerate CPAP.  Has not warn in 5-7 years.    Constitutional:  BP 130/80 (BP Location: Right Arm, Patient Position: Sitting, Cuff Size: Large)   Pulse 80   Temp 97.7 F (36.5 C) (Oral)   Ht 5' 2.5" (1.588 m)   Wt 260 lb 6.4 oz (118.1 kg)   SpO2 95%   BMI 46.87 kg/m   Past Medical History:  Allergies, GERD, HTN, Back pain, HLD, Neuropathy, Pneumonia, DM type 2  Past Surgical History:  She  has a past surgical history that includes Cholecystectomy; Abdominal hysterectomy; Replacement total knee bilateral; Cystectomy; Eye surgery (Bilateral); Colonoscopy; Reverse shoulder arthroplasty (Right, 09/29/2015); Colonoscopy with propofol (N/A, 05/07/2017); TEE without cardioversion (N/A, 04/17/2020); Bubble study (04/17/2020); and RIGHT/LEFT HEART CATH AND CORONARY ANGIOGRAPHY (N/A, 05/29/2020).  Brief Summary:  Melissa Zhang is a 80 y.o. female with snoring.      Subjective:   She is followed by Dr. Percival Spanish for CHF and valvular heart disease.  She had sleep study several years ago that showed sleep apnea.  She was tried on CPAP for a few months.  She had trouble wearing full face mask and stopped using CPAP.  She still snores and is restless at night.  Her husband says she will stop breathing while asleep.  She can fall asleep watching TV or a movie.  She goes to sleep at 12 am.  She falls asleep in 15 minutes.  She wakes up 2 or 3 times to use the bathroom.  She gets out of bed at 9 am.  She feels okay in the morning.  She denies morning headache.  She does not use anything to help her stay awake.  She uses gabapentin at night for neuropathy and this helps her fall asleep also.  She denies sleep walking, sleep talking, bruxism, or nightmares.  There is no history of restless legs.  She denies sleep hallucinations, sleep paralysis, or cataplexy.  The Epworth  score is 6 out of 24.   Physical Exam:   Appearance - well kempt   ENMT - no sinus tenderness, no oral exudate, no LAN, Mallampati 3 airway, no stridor  Respiratory - equal breath sounds bilaterally, no wheezing or rales  CV - s1s2 regular rate and rhythm, no murmurs  Ext - no clubbing, no edema  Skin - no rashes  Psych - normal mood and affect   Sleep Tests:    Cardiac Tests:  Echo 04/17/20 >> EF 45 to 50%, mild/mod MR  Social History:  She  reports that she has never smoked. She has never used smokeless tobacco. She reports that she does not drink alcohol and does not use drugs.  Family History:  Her family history includes Diabetes in her father; Heart attack in her father; Hypertension in her father.    Discussion:  She has snoring, sleep disruption, daytime sleepiness, and apnea.  She has history of hypertension.  Her BMI is > 35.  She had previous sleep study showing sleep apnea, but had trouble with CPAP set up then due to mask fit.  I am concerned she still has significant sleep apnea.  Assessment/Plan:   Snoring with excessive daytime sleepiness. - will need to arrange for a home sleep study  Obesity. - discussed how weight can impact sleep and risk for sleep disordered breathing - discussed options to  assist with weight loss: combination of diet modification, cardiovascular and strength training exercises  Cardiovascular risk. - had an extensive discussion regarding the adverse health consequences related to untreated sleep disordered breathing - specifically discussed the risks for hypertension, coronary artery disease, cardiac dysrhythmias, cerebrovascular disease, and diabetes - lifestyle modification discussed  Safe driving practices. - discussed how sleep disruption can increase risk of accidents, particularly when driving - safe driving practices were discussed  Therapies for obstructive sleep apnea. - if the sleep study shows significant sleep  apnea, then various therapies for treatment were reviewed: CPAP, oral appliance, and surgical interventions  Time Spent Involved in Patient Care on Day of Examination:  32 minutes  Follow up:   Patient Instructions  Will arrange for home sleep study Will call to arrange for follow up after sleep study reviewed  Medication List:   Allergies as of 08/16/2020       Reactions   Lisinopril Anaphylaxis, Swelling   TONGUE SWELLING   Actos [pioglitazone] Swelling   Losartan Other (See Comments)   Made potassium levels go up CAUTION: SEVERE REACTION TO ACE-I ? ANAPHYLAXIS ?   Ciprofloxacin Nausea Only        Medication List        Accurate as of August 16, 2020 12:09 PM. If you have any questions, ask your nurse or doctor.          acetaminophen 650 MG CR tablet Commonly known as: TYLENOL Take 1,300 mg by mouth every 8 (eight) hours as needed for pain.   Align 4 MG Caps Take 4 mg by mouth daily.   bisoprolol 10 MG tablet Commonly known as: ZEBETA Take 1 tablet (10 mg total) by mouth daily.   DULoxetine 60 MG capsule Commonly known as: CYMBALTA Take 60 mg by mouth daily.   furosemide 20 MG tablet Commonly known as: LASIX Take 1 tablet (20 mg total) by mouth daily.   gabapentin 300 MG capsule Commonly known as: NEURONTIN Take 300 mg by mouth at bedtime.   glimepiride 4 MG tablet Commonly known as: AMARYL Take 4 mg by mouth 2 (two) times daily.   hydrALAZINE 25 MG tablet Commonly known as: APRESOLINE Take 1 tablet (25 mg total) by mouth 3 (three) times daily.   hydrochlorothiazide 12.5 MG capsule Commonly known as: MICROZIDE Take 1 capsule (12.5 mg total) by mouth daily.   isosorbide dinitrate 20 MG tablet Commonly known as: ISORDIL Take 1 tablet (20 mg total) by mouth 3 (three) times daily.   Lantus SoloStar 100 UNIT/ML Solostar Pen Generic drug: insulin glargine Inject 55 Units into the skin at bedtime.   levothyroxine 50 MCG tablet Commonly known  as: SYNTHROID Take 50 mcg by mouth daily.   lovastatin 40 MG tablet Commonly known as: MEVACOR Take 40 mg by mouth at bedtime.   metFORMIN 500 MG tablet Commonly known as: GLUCOPHAGE Take 250 mg by mouth 2 (two) times daily with a meal.   Myrbetriq 25 MG Tb24 tablet Generic drug: mirabegron ER Take 25 mg by mouth daily.   Trulicity 5.63 JS/9.7WY Sopn Generic drug: Dulaglutide Inject 0.75 mg as directed every Tuesday.        Signature:  Chesley Mires, MD [place] Pager - [phone] 08/16/2020, 12:09 PM

## 2020-08-23 ENCOUNTER — Ambulatory Visit: Payer: Medicare HMO | Admitting: Cardiology

## 2020-09-20 ENCOUNTER — Ambulatory Visit: Payer: Medicare HMO

## 2020-09-20 ENCOUNTER — Other Ambulatory Visit: Payer: Self-pay

## 2020-09-20 DIAGNOSIS — G4733 Obstructive sleep apnea (adult) (pediatric): Secondary | ICD-10-CM | POA: Diagnosis not present

## 2020-09-20 DIAGNOSIS — R0683 Snoring: Secondary | ICD-10-CM

## 2020-09-22 ENCOUNTER — Telehealth: Payer: Self-pay | Admitting: Pulmonary Disease

## 2020-09-22 DIAGNOSIS — G4733 Obstructive sleep apnea (adult) (pediatric): Secondary | ICD-10-CM | POA: Diagnosis not present

## 2020-09-22 NOTE — Telephone Encounter (Signed)
Called and spoke with patient to let her know HST results from Dr. Halford Chessman. She expressed understanding. Patient lives in [place] and would like to be seen in [place]. She is now scheduled with Dr. Halford Chessman in [place] on 11/20/20 at 11am. Nothing further needed at this time.

## 2020-09-22 NOTE — Telephone Encounter (Signed)
HST 09/21/20 >> 10.8, SpO2 low 82%   Please inform her that her sleep study shows mild obstructive sleep apnea.  Please arrange for ROV with me or NP to discuss treatment options.

## 2020-09-27 ENCOUNTER — Ambulatory Visit: Payer: Medicare HMO | Admitting: Cardiology

## 2020-11-08 DIAGNOSIS — D500 Iron deficiency anemia secondary to blood loss (chronic): Secondary | ICD-10-CM | POA: Diagnosis not present

## 2020-11-08 DIAGNOSIS — I25118 Atherosclerotic heart disease of native coronary artery with other forms of angina pectoris: Secondary | ICD-10-CM | POA: Diagnosis not present

## 2020-11-08 DIAGNOSIS — I5032 Chronic diastolic (congestive) heart failure: Secondary | ICD-10-CM | POA: Diagnosis not present

## 2020-11-08 DIAGNOSIS — I272 Pulmonary hypertension, unspecified: Secondary | ICD-10-CM | POA: Diagnosis not present

## 2020-11-08 DIAGNOSIS — E1140 Type 2 diabetes mellitus with diabetic neuropathy, unspecified: Secondary | ICD-10-CM | POA: Diagnosis not present

## 2020-11-08 DIAGNOSIS — E785 Hyperlipidemia, unspecified: Secondary | ICD-10-CM | POA: Diagnosis not present

## 2020-11-08 DIAGNOSIS — D509 Iron deficiency anemia, unspecified: Secondary | ICD-10-CM | POA: Diagnosis not present

## 2020-11-08 DIAGNOSIS — I10 Essential (primary) hypertension: Secondary | ICD-10-CM | POA: Diagnosis not present

## 2020-11-08 DIAGNOSIS — E039 Hypothyroidism, unspecified: Secondary | ICD-10-CM | POA: Diagnosis not present

## 2020-11-15 DIAGNOSIS — E039 Hypothyroidism, unspecified: Secondary | ICD-10-CM | POA: Diagnosis not present

## 2020-11-15 DIAGNOSIS — G4733 Obstructive sleep apnea (adult) (pediatric): Secondary | ICD-10-CM | POA: Diagnosis not present

## 2020-11-15 DIAGNOSIS — I10 Essential (primary) hypertension: Secondary | ICD-10-CM | POA: Diagnosis not present

## 2020-11-15 DIAGNOSIS — Z7984 Long term (current) use of oral hypoglycemic drugs: Secondary | ICD-10-CM | POA: Diagnosis not present

## 2020-11-15 DIAGNOSIS — I272 Pulmonary hypertension, unspecified: Secondary | ICD-10-CM | POA: Diagnosis not present

## 2020-11-15 DIAGNOSIS — Z794 Long term (current) use of insulin: Secondary | ICD-10-CM | POA: Diagnosis not present

## 2020-11-15 DIAGNOSIS — Z0000 Encounter for general adult medical examination without abnormal findings: Secondary | ICD-10-CM | POA: Diagnosis not present

## 2020-11-15 DIAGNOSIS — D509 Iron deficiency anemia, unspecified: Secondary | ICD-10-CM | POA: Diagnosis not present

## 2020-11-15 DIAGNOSIS — N183 Chronic kidney disease, stage 3 unspecified: Secondary | ICD-10-CM | POA: Diagnosis not present

## 2020-11-15 DIAGNOSIS — E785 Hyperlipidemia, unspecified: Secondary | ICD-10-CM | POA: Diagnosis not present

## 2020-11-15 DIAGNOSIS — E1140 Type 2 diabetes mellitus with diabetic neuropathy, unspecified: Secondary | ICD-10-CM | POA: Diagnosis not present

## 2020-11-20 ENCOUNTER — Ambulatory Visit: Payer: Medicare HMO | Admitting: Pulmonary Disease

## 2020-12-21 DIAGNOSIS — E039 Hypothyroidism, unspecified: Secondary | ICD-10-CM | POA: Diagnosis not present

## 2021-01-16 DIAGNOSIS — E1140 Type 2 diabetes mellitus with diabetic neuropathy, unspecified: Secondary | ICD-10-CM | POA: Diagnosis not present

## 2021-01-16 DIAGNOSIS — E785 Hyperlipidemia, unspecified: Secondary | ICD-10-CM | POA: Diagnosis not present

## 2021-01-16 DIAGNOSIS — I272 Pulmonary hypertension, unspecified: Secondary | ICD-10-CM | POA: Diagnosis not present

## 2021-01-16 DIAGNOSIS — I5032 Chronic diastolic (congestive) heart failure: Secondary | ICD-10-CM | POA: Diagnosis not present

## 2021-01-16 DIAGNOSIS — E039 Hypothyroidism, unspecified: Secondary | ICD-10-CM | POA: Diagnosis not present

## 2021-01-16 DIAGNOSIS — I10 Essential (primary) hypertension: Secondary | ICD-10-CM | POA: Diagnosis not present

## 2021-01-16 DIAGNOSIS — I25118 Atherosclerotic heart disease of native coronary artery with other forms of angina pectoris: Secondary | ICD-10-CM | POA: Diagnosis not present

## 2021-01-20 DIAGNOSIS — J069 Acute upper respiratory infection, unspecified: Secondary | ICD-10-CM | POA: Diagnosis not present

## 2021-01-20 DIAGNOSIS — Z20822 Contact with and (suspected) exposure to covid-19: Secondary | ICD-10-CM | POA: Diagnosis not present

## 2021-02-08 DIAGNOSIS — I10 Essential (primary) hypertension: Secondary | ICD-10-CM | POA: Diagnosis not present

## 2021-02-08 DIAGNOSIS — D500 Iron deficiency anemia secondary to blood loss (chronic): Secondary | ICD-10-CM | POA: Diagnosis not present

## 2021-02-08 DIAGNOSIS — E1140 Type 2 diabetes mellitus with diabetic neuropathy, unspecified: Secondary | ICD-10-CM | POA: Diagnosis not present

## 2021-02-08 DIAGNOSIS — E039 Hypothyroidism, unspecified: Secondary | ICD-10-CM | POA: Diagnosis not present

## 2021-02-08 DIAGNOSIS — E785 Hyperlipidemia, unspecified: Secondary | ICD-10-CM | POA: Diagnosis not present

## 2021-02-16 ENCOUNTER — Other Ambulatory Visit: Payer: Self-pay | Admitting: Cardiology

## 2021-02-27 ENCOUNTER — Telehealth: Payer: Self-pay | Admitting: *Deleted

## 2021-02-27 NOTE — Telephone Encounter (Signed)
Received fax from [facility] for refills. She will need to make a follow up appointment in the [facility] office with dr hochrein for refills.

## 2021-03-05 DIAGNOSIS — K602 Anal fissure, unspecified: Secondary | ICD-10-CM | POA: Diagnosis not present

## 2021-03-16 MED ORDER — HYDRALAZINE HCL 25 MG PO TABS: 25.0000 mg | ORAL_TABLET | Freq: Three times a day (TID) | ORAL | 3 refills | Status: DC

## 2021-03-16 MED ORDER — ISOSORBIDE DINITRATE 20 MG PO TABS: 20.0000 mg | ORAL_TABLET | Freq: Three times a day (TID) | ORAL | 3 refills | Status: DC

## 2021-03-16 MED ORDER — FUROSEMIDE 20 MG PO TABS: 20.0000 mg | ORAL_TABLET | Freq: Every day | ORAL | 3 refills | Status: DC

## 2021-03-16 NOTE — Telephone Encounter (Signed)
Spoke with pt, Follow up scheduled  Refill sent to the pharmacy electronically.

## 2021-03-22 DIAGNOSIS — E785 Hyperlipidemia, unspecified: Secondary | ICD-10-CM | POA: Diagnosis not present

## 2021-03-22 DIAGNOSIS — E039 Hypothyroidism, unspecified: Secondary | ICD-10-CM | POA: Diagnosis not present

## 2021-03-22 DIAGNOSIS — E1140 Type 2 diabetes mellitus with diabetic neuropathy, unspecified: Secondary | ICD-10-CM | POA: Diagnosis not present

## 2021-03-22 DIAGNOSIS — I10 Essential (primary) hypertension: Secondary | ICD-10-CM | POA: Diagnosis not present

## 2021-05-11 DIAGNOSIS — E1140 Type 2 diabetes mellitus with diabetic neuropathy, unspecified: Secondary | ICD-10-CM | POA: Diagnosis not present

## 2021-05-11 DIAGNOSIS — E039 Hypothyroidism, unspecified: Secondary | ICD-10-CM | POA: Diagnosis not present

## 2021-05-11 DIAGNOSIS — I5032 Chronic diastolic (congestive) heart failure: Secondary | ICD-10-CM | POA: Diagnosis not present

## 2021-05-11 DIAGNOSIS — I10 Essential (primary) hypertension: Secondary | ICD-10-CM | POA: Diagnosis not present

## 2021-05-11 DIAGNOSIS — E785 Hyperlipidemia, unspecified: Secondary | ICD-10-CM | POA: Diagnosis not present

## 2021-05-19 ENCOUNTER — Other Ambulatory Visit: Payer: Self-pay | Admitting: Cardiology

## 2021-05-24 DIAGNOSIS — M25512 Pain in left shoulder: Secondary | ICD-10-CM | POA: Diagnosis not present

## 2021-05-29 DIAGNOSIS — E1140 Type 2 diabetes mellitus with diabetic neuropathy, unspecified: Secondary | ICD-10-CM | POA: Diagnosis not present

## 2021-06-04 DIAGNOSIS — G4733 Obstructive sleep apnea (adult) (pediatric): Secondary | ICD-10-CM | POA: Insufficient documentation

## 2021-06-04 NOTE — Progress Notes (Addendum)
?  ?Cardiology Office Note ? ? ?Date:  06/06/2021  ? ?ID:  Melissa Zhang, DOB [birth year], MRN [id number] ? ?PCP:  Aura Dials, MD  ?Cardiologist:   Minus Breeding, MD ? ? ?Chief Complaint  ?Patient presents with  ? Edema  ? ? ?  ?History of Present Illness: ?Melissa Zhang is a 81 y.o. female who presents for follow up of valvular heart disease.  Echo in 2018 demonstrated an EF of 50 - 55% with moderate MR and a PFO vs. ASD.   ? ?I saw her for the first time in December 2021.  Follow up echo demonstrated that the that her EF was down to 45%.  I reviewed the images for this appt. I agree that her ejection fraction is slightly reduced compared to previous.   I sent her for a TEE and this was moderately severe.    I sent her for a cardiac cath with non obstructive CAD and severe pulmonary HTN.   ? ?She did see Dr.Sood about her sleep apnea.  She had a home sleep study and is to have follow-up.However, in talking to her today she did not want to have follow-up.  She thinks her ankles are less swollen.  She is not having any new shortness of breath, PND or orthopnea.  Is not describing any new palpitations, presyncope or syncope.  She does drink quite a bit of water per her and her husband ? ? ?Past Medical History:  ?Diagnosis Date  ? Allergic rhinitis   ? Esophageal reflux   ? Hypertension   ? Low back pain   ? Mixed hyperlipidemia   ? Obstructive sleep apnea   ? Peripheral neuropathy   ? Pneumonia   ? history  ? Type 2 diabetes mellitus ([place])   ? ? ?Past Surgical History:  ?Procedure Laterality Date  ? ABDOMINAL HYSTERECTOMY    ? BUBBLE STUDY  04/17/2020  ? Procedure: BUBBLE STUDY;  Surgeon: Fay Records, MD;  Location: [facility];  Service: Cardiovascular;;  ? CHOLECYSTECTOMY    ? COLONOSCOPY    ? COLONOSCOPY WITH PROPOFOL N/A 05/07/2017  ? Procedure: COLONOSCOPY WITH PROPOFOL;  Surgeon: Ronald Lobo, MD;  Location: [facility];  Service: Endoscopy;  Laterality: N/A;  ? CYSTECTOMY    ? LEFT BREAST  ? EYE  SURGERY Bilateral   ? caratacts  ? REPLACEMENT TOTAL KNEE BILATERAL    ? REVERSE SHOULDER ARTHROPLASTY Right 09/29/2015  ? Procedure: RIGHT REVERSE TOTAL SHOULDER ARTHROPLASTY;  Surgeon: Netta Cedars, MD;  Location: [facility];  Service: Orthopedics;  Laterality: Right;  ? RIGHT/LEFT HEART CATH AND CORONARY ANGIOGRAPHY N/A 05/29/2020  ? Procedure: RIGHT/LEFT HEART CATH AND CORONARY ANGIOGRAPHY;  Surgeon: [name], MD;  Location: [facility];  Service: Cardiovascular;  Laterality: N/A;  ? TEE WITHOUT CARDIOVERSION N/A 04/17/2020  ? Procedure: TRANSESOPHAGEAL ECHOCARDIOGRAM (TEE);  Surgeon: Fay Records, MD;  Location: [facility];  Service: Cardiovascular;  Laterality: N/A;  ? ? ? ?Current Outpatient Medications  ?Medication Sig Dispense Refill  ? acetaminophen (TYLENOL) 650 MG CR tablet Take 1,300 mg by mouth every 8 (eight) hours as needed for pain.    ? bisoprolol (ZEBETA) 10 MG tablet TAKE 1 TABLET BY MOUTH EVERY DAY 90 tablet 1  ? Dulaglutide (TRULICITY) 4.48 JE/5.6DJ SOPN Inject 0.75 mg as directed every Tuesday.    ? DULoxetine (CYMBALTA) 60 MG capsule Take 60 mg by mouth daily.    ? furosemide (LASIX) 20 MG tablet Take 1 tablet (  20 mg total) by mouth daily. 90 tablet 3  ? gabapentin (NEURONTIN) 300 MG capsule Take 300 mg by mouth at bedtime.    ? glimepiride (AMARYL) 4 MG tablet Take 4 mg by mouth 2 (two) times daily.    ? hydrALAZINE (APRESOLINE) 50 MG tablet Take 1 tablet (50 mg total) by mouth 3 (three) times daily. 270 tablet 3  ? hydrochlorothiazide (MICROZIDE) 12.5 MG capsule Take 1 capsule (12.5 mg total) by mouth daily. 90 capsule 3  ? isosorbide dinitrate (ISORDIL) 20 MG tablet TAKE 1 TABLET BY MOUTH THREE TIMES A DAY 270 tablet 3  ? LANTUS SOLOSTAR 100 UNIT/ML Solostar Pen Inject 55 Units into the skin at bedtime.    ? levothyroxine (SYNTHROID, LEVOTHROID) 50 MCG tablet 75 mcg daily.    ? lovastatin (MEVACOR) 40 MG tablet Take 40 mg by mouth at bedtime.    ? metFORMIN (GLUCOPHAGE) 500 MG  tablet Take 250 mg by mouth 2 (two) times daily with a meal.    ? Probiotic Product (ALIGN) 4 MG CAPS Take 4 mg by mouth daily.    ? ?No current facility-administered medications for this visit.  ? ? ?Allergies:   Lisinopril, Actos [pioglitazone], Losartan, and Ciprofloxacin  ? ? ?ROS:  Please see the history of present illness.   Otherwise, review of systems are positive for none.   All other systems are reviewed and negative.  ? ? ?PHYSICAL EXAM: ?VS:  BP 120/64   Pulse 73   Ht '5\' 2"'$  (1.575 m)   Wt 263 lb (119.3 kg)   BMI 48.10 kg/m?  , BMI Body mass index is 48.1 kg/m?.  ?GENERAL:  Well appearing ?NECK:  Positive jugular venous distention, waveform within normal limits, carotid upstroke brisk and symmetric, no bruits, no thyromegaly ?LUNGS:  Clear to auscultation bilaterally ?CHEST:  Unremarkable ?HEART:  PMI not displaced or sustained,S1 and S2 within normal limits, no S3, no S4, no clicks, no rubs, 3 out of 6 holosystolic murmur heard anteriorly, no diastolic murmur murmurs ?ABD:  Flat, positive bowel sounds normal in frequency in pitch, no bruits, no rebound, no guarding, no midline pulsatile mass, no hepatomegaly, no splenomegaly ?EXT:  2 plus pulses throughout, mild bilateral lower extremity edema, no cyanosis no clubbing ? ? ?EKG:  EKG is  ordered today. ?Sinus rhythm, rate 73, left axis deviation, interventricular conduction delay, no acute ST-T wave changes. ?Recent Labs: ?No results found for requested labs within last 8760 hours.  ? ? ?Lipid Panel ?No results found for: CHOL, TRIG, HDL, CHOLHDL, VLDL, LDLCALC, LDLDIRECT ?  ? ?Wt Readings from Last 3 Encounters:  ?06/06/21 263 lb (119.3 kg)  ?08/16/20 260 lb 6.4 oz (118.1 kg)  ?07/05/20 265 lb (120.2 kg)  ?  ? ? ?Other studies Reviewed: ?Additional studies/ records that were reviewed today include:   Pulmonary records ?Review of the above records demonstrates:  Please see elsewhere in the note.   ? ? ?ASSESSMENT AND PLAN: ? ?MR:     She had a TEE and  this suggested that the MR was moderate to moderately severe..    I am going to follow this with continued afterload reduction.  I we will follow-up with an echo after the next visit.   ? ?Pulmonary HTN:  This is likely secondary to OSA and obesity.   I do not think there is a primary component.  Her [place] units for 3.24.  I am trying to get her back into see pulmonary to have this  treated but I do not think she will have her sleep apnea treated.  I will otherwise manage her blood pressure, diastolic dysfunction and mitral regurgitation. ? ?Cardiomyopathy:  Her EF was 45% at the last visit.  She had anaphylaxis in the past to ACE inhibitor so I am not going to even consider using an ARB in this situation.   Today I will increase her hydralazine to 50 mg 3 times daily. ? ?Morbid obesity:     We have had continued discussion about this and she needs weight loss. ? ?Grade II diastolic dysfunction:   We talked about reducing her fluid intake to 48 ounces. ? ?Small secundum ASD:   This is an incidental finding.   ? ?Sleep apnea:    She had an abnormal home sleep study.  She is due to follow-up with Dr.  Halford Chessman in [place].  I contacted Dr. Halford Chessman and they will try to contact her to pursue follow-up but I do not think she probably will.   ? ?Essential HTN:   This is being treated in the context of managing her diastolic dysfunction and mitral regurgitation.  ? ?Current medicines are reviewed at length with the patient today.  The patient does not have concerns regarding medicines. ? ?The following changes have been made: As above ? ?Labs/ tests ordered today include:   ? ?Orders Placed This Encounter  ?Procedures  ? EKG 12-Lead  ? ? ? ?Disposition:   FU me in about 6 months ? ? ?Signed, ?Minus Breeding, MD  ?06/06/2021 1:04 PM    ?[place] ?

## 2021-06-06 ENCOUNTER — Telehealth: Payer: Self-pay

## 2021-06-06 ENCOUNTER — Ambulatory Visit: Payer: Medicare HMO | Admitting: Cardiology

## 2021-06-06 ENCOUNTER — Encounter: Payer: Self-pay | Admitting: Cardiology

## 2021-06-06 VITALS — BP 120/64 | HR 73 | Ht 62.0 in | Wt 263.0 lb

## 2021-06-06 DIAGNOSIS — I272 Pulmonary hypertension, unspecified: Secondary | ICD-10-CM

## 2021-06-06 DIAGNOSIS — G4733 Obstructive sleep apnea (adult) (pediatric): Secondary | ICD-10-CM

## 2021-06-06 DIAGNOSIS — I340 Nonrheumatic mitral (valve) insufficiency: Secondary | ICD-10-CM

## 2021-06-06 DIAGNOSIS — I5189 Other ill-defined heart diseases: Secondary | ICD-10-CM

## 2021-06-06 MED ORDER — HYDRALAZINE HCL 50 MG PO TABS: 50.0000 mg | ORAL_TABLET | Freq: Three times a day (TID) | ORAL | 3 refills | Status: DC

## 2021-06-06 NOTE — Patient Instructions (Signed)
Medication Instructions:  ?Please increase your Hydralazine to 50 mg three times a day. ?Continue all other medications as listed. ? ?*If you need a refill on your cardiac medications before your next appointment, please call your pharmacy* ? ?Follow-Up: ?At [facility] you and your health needs are our priority.  As part of our continuing mission to provide you with exceptional heart care, we have created designated Provider Care Teams.  These Care Teams include your primary Cardiologist (physician) and Advanced Practice Providers (APPs -  Physician Assistants and Nurse Practitioners) who all work together to provide you with the care you need, when you need it. ? ?We recommend signing up for the patient portal called "MyChart".  Sign up information is provided on this After Visit Summary.  MyChart is used to connect with patients for Virtual Visits (Telemedicine).  Patients are able to view lab/test results, encounter notes, upcoming appointments, etc.  Non-urgent messages can be sent to your provider as well.   ?To learn more about what you can do with MyChart, go to [email or web address].   ? ?Your next appointment:   ?6 month(s) ? ?The format for your next appointment:   ?In Person ? ?Provider:   ?Minus Breeding, MD  ? ? ?Important Information About [place] ? ? ? ? ?  ?

## 2021-06-06 NOTE — Telephone Encounter (Signed)
ATC patient. LMTCB. If patient calls back she needs a [facility] office visit, Next available.  ?

## 2021-06-06 NOTE — Telephone Encounter (Signed)
-----   Message from Chesley Mires, MD sent at 06/06/2021 12:36 PM EDT ----- ?I saw her last Summer for sleep apnea assessment.  Her home sleep study showed mild sleep apnea.  She was supposed to have a follow up appointment to discuss treatment options, but it looks like she canceled the appointment. ? ?I can have my nurse call her to see if she would like to come in for an appointment to discuss further assessment of her pulmonary hypertension and how this might be impacted by untreated sleep apnea. ? ?[place] ?----- Message ----- ?From: Minus Breeding, MD ?Sent: 06/06/2021  12:20 PM EDT ?To: Chesley Mires, MD ? ?Hey,  What do you think of her severe pulmonary HTN?  Maylon Cos ? ? ?

## 2021-06-07 DIAGNOSIS — E1140 Type 2 diabetes mellitus with diabetic neuropathy, unspecified: Secondary | ICD-10-CM | POA: Diagnosis not present

## 2021-06-07 DIAGNOSIS — E785 Hyperlipidemia, unspecified: Secondary | ICD-10-CM | POA: Diagnosis not present

## 2021-06-07 DIAGNOSIS — Z23 Encounter for immunization: Secondary | ICD-10-CM | POA: Diagnosis not present

## 2021-06-07 DIAGNOSIS — D509 Iron deficiency anemia, unspecified: Secondary | ICD-10-CM | POA: Diagnosis not present

## 2021-06-07 DIAGNOSIS — E113293 Type 2 diabetes mellitus with mild nonproliferative diabetic retinopathy without macular edema, bilateral: Secondary | ICD-10-CM | POA: Diagnosis not present

## 2021-06-07 DIAGNOSIS — D649 Anemia, unspecified: Secondary | ICD-10-CM | POA: Diagnosis not present

## 2021-06-07 DIAGNOSIS — I10 Essential (primary) hypertension: Secondary | ICD-10-CM | POA: Diagnosis not present

## 2021-06-07 DIAGNOSIS — I272 Pulmonary hypertension, unspecified: Secondary | ICD-10-CM | POA: Diagnosis not present

## 2021-06-07 DIAGNOSIS — N183 Chronic kidney disease, stage 3 unspecified: Secondary | ICD-10-CM | POA: Diagnosis not present

## 2021-06-07 NOTE — Telephone Encounter (Signed)
Called and spoke with patient. Scheduled her for an OV in June 2023 first available in [facility]. Nothing further needed ?

## 2021-06-21 DIAGNOSIS — M25512 Pain in left shoulder: Secondary | ICD-10-CM | POA: Diagnosis not present

## 2021-06-22 ENCOUNTER — Telehealth: Payer: Self-pay | Admitting: Cardiology

## 2021-06-22 NOTE — Telephone Encounter (Signed)
Spoke with patient of Dr. Percival Spanish - she reports hydralazine '50mg'$  TID caused dizziness/headaches. This was increased on 4/19. Patient was told by [facility] MD that if it made her dizzy she may need decrease to '25mg'$  TID, so she did this - she took decreased dose of hydralazine yesterday and today and head felt better today. ? ?She is currently taking isordil '20mg'$  TID, bisoprolol '10mg'$  QD + '5mg'$  QD (by PCP), hydrochlorothiazide 12.'5mg'$  and hydralazine '25mg'$  TID (she decreased herself), lasix '20mg'$  QD ? ?At PCP office, 118/70 about 1 week ago  ?BP at home 122/84, 128/74 ?Yesterday at ortho office, BP was 150/100  ? ?Advised will send to Dr. Percival Spanish to review. She will continue taking hydralazine '25mg'$  TID until MD reviews ?

## 2021-06-22 NOTE — Telephone Encounter (Signed)
Pt c/o medication issue: ? ?1. Name of Medication:  ? hydrALAZINE (APRESOLINE) 50 MG tablet  ? ? ?2. How are you currently taking this medication (dosage and times per day)?  ?Take 1 tablet (50 mg total) by mouth 3 (three) times daily ?3. Are you having a reaction (difficulty breathing--STAT)? No ? ?4. What is your medication issue? Pt states that medication is causing her to have headaches and dizziness. Pt states that she went back to taking the 25 mg dosage. Please advise ? ?

## 2021-06-25 DIAGNOSIS — D509 Iron deficiency anemia, unspecified: Secondary | ICD-10-CM | POA: Diagnosis not present

## 2021-06-25 DIAGNOSIS — D649 Anemia, unspecified: Secondary | ICD-10-CM | POA: Diagnosis not present

## 2021-06-25 DIAGNOSIS — Z86010 Personal history of colonic polyps: Secondary | ICD-10-CM | POA: Diagnosis not present

## 2021-06-25 NOTE — Telephone Encounter (Signed)
Spoke with pt, aware of dr hochrein's recommendations. ?

## 2021-07-18 DIAGNOSIS — E785 Hyperlipidemia, unspecified: Secondary | ICD-10-CM | POA: Diagnosis not present

## 2021-07-18 DIAGNOSIS — E1140 Type 2 diabetes mellitus with diabetic neuropathy, unspecified: Secondary | ICD-10-CM | POA: Diagnosis not present

## 2021-07-18 DIAGNOSIS — I10 Essential (primary) hypertension: Secondary | ICD-10-CM | POA: Diagnosis not present

## 2021-07-18 DIAGNOSIS — G8929 Other chronic pain: Secondary | ICD-10-CM | POA: Diagnosis not present

## 2021-07-18 DIAGNOSIS — E039 Hypothyroidism, unspecified: Secondary | ICD-10-CM | POA: Diagnosis not present

## 2021-07-30 ENCOUNTER — Encounter: Payer: Self-pay | Admitting: Pulmonary Disease

## 2021-07-30 ENCOUNTER — Ambulatory Visit: Payer: Medicare HMO | Admitting: Pulmonary Disease

## 2021-07-30 VITALS — BP 122/70 | HR 64 | Temp 97.5°F | Ht 62.0 in | Wt 264.8 lb

## 2021-07-30 DIAGNOSIS — G4733 Obstructive sleep apnea (adult) (pediatric): Secondary | ICD-10-CM | POA: Diagnosis not present

## 2021-07-30 DIAGNOSIS — G4730 Sleep apnea, unspecified: Secondary | ICD-10-CM

## 2021-07-30 DIAGNOSIS — Z789 Other specified health status: Secondary | ICD-10-CM | POA: Diagnosis not present

## 2021-07-30 DIAGNOSIS — E669 Obesity, unspecified: Secondary | ICD-10-CM

## 2021-07-30 DIAGNOSIS — I2729 Other secondary pulmonary hypertension: Secondary | ICD-10-CM

## 2021-07-30 DIAGNOSIS — G478 Other sleep disorders: Secondary | ICD-10-CM | POA: Diagnosis not present

## 2021-07-30 NOTE — Patient Instructions (Signed)
Check with your dentist about whether you can get an oral appliance to treat obstructive sleep apnea  Call if you would like to get additional testing done, and then we can arrange for an overnight oxygen test to see if you qualify for using supplemental oxygen at night while asleep

## 2021-07-30 NOTE — Progress Notes (Signed)
[facility]  Chief Complaint  Patient presents with   Follow-up    Patient feels like she is sleeping good. No concerns    Constitutional:  BP 122/70 (BP Location: Left Arm, Patient Position: Sitting, Cuff Size: Large)   Pulse 64   Temp (!) 97.5 F (36.4 C) (Oral)   Ht '5\' 2"'$  (1.575 m)   Wt 264 lb 12.8 oz (120.1 kg)   SpO2 93%   BMI 48.43 kg/m   Past Medical History:  Allergies, GERD, HTN, Back pain, HLD, Neuropathy, Pneumonia, DM type 2  Past Surgical History:  She  has a past surgical history that includes Cholecystectomy; Abdominal hysterectomy; Replacement total knee bilateral; Cystectomy; Eye surgery (Bilateral); Colonoscopy; Reverse shoulder arthroplasty (Right, 09/29/2015); Colonoscopy with propofol (N/A, 05/07/2017); TEE without cardioversion (N/A, 04/17/2020); Bubble study (04/17/2020); and RIGHT/LEFT HEART CATH AND CORONARY ANGIOGRAPHY (N/A, 05/29/2020).  Brief Summary:  Melissa Zhang is a 81 y.o. female with obstructive sleep apnea.      Subjective:   She had repeat sleep study in August 2022.  Showed mild sleep apnea.  She tried CPAP before, but couldn't tolerate wearing anything on her face.  She only tried full face mask, but wouldn't want to try any other options.  She asked about whether she could get an Rosser device.  She needs to schedule an appointment with her dentist.  She feels her sleep is okay, and she feels rested during the day.   Physical Exam:   Appearance - well kempt   ENMT - no sinus tenderness, no oral exudate, no LAN, Mallampati 3 airway, no stridor, extensive dental work  Respiratory - equal breath sounds bilaterally, no wheezing or rales  CV - s1s2 regular rate and rhythm, no murmurs  Ext - no clubbing, no edema  Skin - no rashes  Psych - normal mood and affect    Sleep Tests:  HST 09/21/20 >> 10.8, SpO2 low 82%  Cardiac Tests:  Echo 04/17/20 >> EF 45 to 50%, mild/mod MR RHC  05/29/20 >> PAP 75/24, mean 45, PCWP 23 mmHg, PVR 3.23 woods units  Social History:  She  reports that she has never smoked. She has never used smokeless tobacco. She reports that she does not drink alcohol and does not use drugs.  Family History:  Her family history includes Diabetes in her father; Heart attack in her father; Hypertension in her father.     Discussion:  I had a length discussion with her about how sleep apnea can impact her cardiovascular health, and explained that this can occur even if she doesn't perceive any issues with her sleep quality.  She is not interested in trying CPAP therapy again.  She doesn't feel like she needs additional interventions at this time, but will call if she changes her mind.   Assessment/Plan:   Obstructive sleep apnea with pulmonary hypertension. - intolerant of CPAP - she will check with her dentist about whether she is a candidate for an oral appliance - she will call to schedule overnight oximetry on room air if she decides to see if she is a candidate for supplemental oxygen at night  Obesity. - discussed how her weight is impacting her sleep apnea and reviewed different approaches to weight loss  Mitral regurgitation, Hypertension, Chronic diastolic/systolic CHF. - followed by Dr. Minus Breeding with cardiology  Time Spent Involved in Patient Care on Day of Examination:  27 minutes  Follow up:   Patient Instructions  Check with your dentist about whether you can get an oral appliance to treat obstructive sleep apnea  Call if you would like to get additional testing done, and then we can arrange for an overnight oxygen test to see if you qualify for using supplemental oxygen at night while asleep  Medication List:   Allergies as of 07/30/2021       Reactions   Lisinopril Anaphylaxis, Swelling   TONGUE SWELLING   Actos [pioglitazone] Swelling   Losartan Other (See Comments)   Made potassium levels go up CAUTION: SEVERE  REACTION TO ACE-I ? ANAPHYLAXIS ?   Ciprofloxacin Nausea Only        Medication List        Accurate as of July 30, 2021 10:13 AM. If you have any questions, ask your nurse or doctor.          acetaminophen 650 MG CR tablet Commonly known as: TYLENOL Take 1,300 mg by mouth every 8 (eight) hours as needed for pain.   Align 4 MG Caps Take 4 mg by mouth daily.   bisoprolol 10 MG tablet Commonly known as: ZEBETA TAKE 1 TABLET BY MOUTH EVERY DAY   DULoxetine 60 MG capsule Commonly known as: CYMBALTA Take 60 mg by mouth daily.   furosemide 20 MG tablet Commonly known as: LASIX Take 1 tablet (20 mg total) by mouth daily.   gabapentin 300 MG capsule Commonly known as: NEURONTIN Take 300 mg by mouth at bedtime.   glimepiride 4 MG tablet Commonly known as: AMARYL Take 4 mg by mouth 2 (two) times daily.   hydrALAZINE 50 MG tablet Commonly known as: APRESOLINE Take 1 tablet (50 mg total) by mouth 3 (three) times daily.   hydrochlorothiazide 12.5 MG capsule Commonly known as: MICROZIDE Take 1 capsule (12.5 mg total) by mouth daily.   isosorbide dinitrate 20 MG tablet Commonly known as: ISORDIL TAKE 1 TABLET BY MOUTH THREE TIMES A DAY   Lantus SoloStar 100 UNIT/ML Solostar Pen Generic drug: insulin glargine Inject 55 Units into the skin at bedtime.   levothyroxine 50 MCG tablet Commonly known as: SYNTHROID 75 mcg daily.   lovastatin 40 MG tablet Commonly known as: MEVACOR Take 40 mg by mouth at bedtime.   metFORMIN 500 MG tablet Commonly known as: GLUCOPHAGE Take 250 mg by mouth 2 (two) times daily with a meal.   Trulicity 4.65 KC/1.2XN Sopn Generic drug: Dulaglutide Inject 0.75 mg as directed every Tuesday.        Signature:  Chesley Mires, MD [place] Pager - [phone] 07/30/2021, 10:13 AM

## 2021-08-27 DIAGNOSIS — D649 Anemia, unspecified: Secondary | ICD-10-CM | POA: Diagnosis not present

## 2021-08-27 DIAGNOSIS — D500 Iron deficiency anemia secondary to blood loss (chronic): Secondary | ICD-10-CM | POA: Diagnosis not present

## 2021-08-27 DIAGNOSIS — I10 Essential (primary) hypertension: Secondary | ICD-10-CM | POA: Diagnosis not present

## 2021-09-06 ENCOUNTER — Other Ambulatory Visit: Payer: Self-pay | Admitting: Cardiology

## 2021-10-16 DIAGNOSIS — Z23 Encounter for immunization: Secondary | ICD-10-CM | POA: Diagnosis not present

## 2021-10-16 DIAGNOSIS — D509 Iron deficiency anemia, unspecified: Secondary | ICD-10-CM | POA: Diagnosis not present

## 2021-10-16 DIAGNOSIS — E1140 Type 2 diabetes mellitus with diabetic neuropathy, unspecified: Secondary | ICD-10-CM | POA: Diagnosis not present

## 2021-10-16 DIAGNOSIS — I10 Essential (primary) hypertension: Secondary | ICD-10-CM | POA: Diagnosis not present

## 2021-10-16 DIAGNOSIS — L309 Dermatitis, unspecified: Secondary | ICD-10-CM | POA: Diagnosis not present

## 2021-10-19 DIAGNOSIS — D509 Iron deficiency anemia, unspecified: Secondary | ICD-10-CM | POA: Diagnosis not present

## 2021-10-19 DIAGNOSIS — I10 Essential (primary) hypertension: Secondary | ICD-10-CM | POA: Diagnosis not present

## 2021-10-19 DIAGNOSIS — E1140 Type 2 diabetes mellitus with diabetic neuropathy, unspecified: Secondary | ICD-10-CM | POA: Diagnosis not present

## 2021-10-19 DIAGNOSIS — L309 Dermatitis, unspecified: Secondary | ICD-10-CM | POA: Diagnosis not present

## 2021-10-19 DIAGNOSIS — I2510 Atherosclerotic heart disease of native coronary artery without angina pectoris: Secondary | ICD-10-CM | POA: Diagnosis not present

## 2021-10-19 DIAGNOSIS — K219 Gastro-esophageal reflux disease without esophagitis: Secondary | ICD-10-CM | POA: Diagnosis not present

## 2021-10-19 DIAGNOSIS — E785 Hyperlipidemia, unspecified: Secondary | ICD-10-CM | POA: Diagnosis not present

## 2021-10-19 DIAGNOSIS — G4733 Obstructive sleep apnea (adult) (pediatric): Secondary | ICD-10-CM | POA: Diagnosis not present

## 2021-10-19 DIAGNOSIS — I272 Pulmonary hypertension, unspecified: Secondary | ICD-10-CM | POA: Diagnosis not present

## 2021-11-15 DIAGNOSIS — E785 Hyperlipidemia, unspecified: Secondary | ICD-10-CM | POA: Diagnosis not present

## 2021-11-15 DIAGNOSIS — E1140 Type 2 diabetes mellitus with diabetic neuropathy, unspecified: Secondary | ICD-10-CM | POA: Diagnosis not present

## 2021-11-16 DIAGNOSIS — Z23 Encounter for immunization: Secondary | ICD-10-CM | POA: Diagnosis not present

## 2021-11-16 DIAGNOSIS — I25118 Atherosclerotic heart disease of native coronary artery with other forms of angina pectoris: Secondary | ICD-10-CM | POA: Diagnosis not present

## 2021-11-16 DIAGNOSIS — E1140 Type 2 diabetes mellitus with diabetic neuropathy, unspecified: Secondary | ICD-10-CM | POA: Diagnosis not present

## 2021-11-16 DIAGNOSIS — D509 Iron deficiency anemia, unspecified: Secondary | ICD-10-CM | POA: Diagnosis not present

## 2021-11-16 DIAGNOSIS — I10 Essential (primary) hypertension: Secondary | ICD-10-CM | POA: Diagnosis not present

## 2021-11-16 DIAGNOSIS — Z0000 Encounter for general adult medical examination without abnormal findings: Secondary | ICD-10-CM | POA: Diagnosis not present

## 2021-11-16 DIAGNOSIS — E785 Hyperlipidemia, unspecified: Secondary | ICD-10-CM | POA: Diagnosis not present

## 2021-11-16 DIAGNOSIS — N1831 Chronic kidney disease, stage 3a: Secondary | ICD-10-CM | POA: Diagnosis not present

## 2021-11-19 ENCOUNTER — Telehealth: Payer: Self-pay | Admitting: Cardiology

## 2021-11-19 NOTE — Telephone Encounter (Signed)
Pt c/o swelling: STAT is pt has developed SOB within 24 hours  If swelling, where is the swelling located? Feet and legs   How much weight have you gained and in what time span? Friday - 269 Before last Friday- 265   Have you gained 3 pounds in a day or 5 pounds in a week?  Do you have a log of your daily weights (if so, list)?   Are you currently taking a fluid pill? Yes   Are you currently SOB? No   Have you traveled recently? No   Pt states she has been having some swelling in her feet and legs. She states she is currently taking a fluid pill and has experienced some sob but not at the moment.

## 2021-11-19 NOTE — Telephone Encounter (Signed)
Patient stated she gained 4 pounds in a week back in September. Her legs and feet are swollen. Clothes fit tighter around her waist. She has exertional sob. BP 120s/70s to 130s/80s. She is watching Na+ and water intake. Medications she is taking: Furosemide '20mg'$  daily Apresoline '25mg'$  three times daily Isordil '20mg'$  three times daily Lovastatin 40 mg at bedtime She has not been taking hydrochlorothiazide 12.'5mg'$  daily since 10/19/21 She has not been taking bisoprolol '10mg'$  or '15mg'$  daily  She has appointment with Dr. Percival Spanish on 10/4 in [place].  Please advise on edema and medications.

## 2021-11-20 NOTE — Progress Notes (Unsigned)
Cardiology Office Note   Date:  11/21/2021   ID:  Olivia, Pavelko September 25, 1940, MRN [id number]  PCP:  Aura Dials, MD  Cardiologist:   Minus Breeding, MD   Chief Complaint  Patient presents with   Edema      History of Present Illness: SHALI VESEY is a 81 y.o. female who presents for follow up of valvular heart disease.  Echo in 2018 demonstrated an EF of 50 - 55% with moderate MR and a PFO vs. ASD.    I saw her for the first time in December 2021.  Follow up echo demonstrated that the that her EF was down to 45%.  Ejection fraction is slightly reduced compared to previous.   I sent her for a TEE and this was moderately severe.    I sent her for a cardiac cath with non obstructive CAD and severe pulmonary HTN.   She called two days ago with increased leg edema.    She has mild increase with report 5 pounds.  She has some shortness of breath getting around which is probably a little bit worse over the last month.  She is not describing new PND or orthopnea.  She is not having neck or arm discomfort but she has a little chest pressure when she feels short of breath.  She tries to watch her salt.  She did reduce her fluid intake as I had cautioned previously.  Unfortunately she has not been able to lose weight.  She saw Dr. Halford Chessman and did not want to wear CPAP for sleep apnea.   Past Medical History:  Diagnosis Date   Allergic rhinitis    Esophageal reflux    Hypertension    Low back pain    Mixed hyperlipidemia    Obstructive sleep apnea    Peripheral neuropathy    Pneumonia    history   Pulmonary hypertension ([place])    Type 2 diabetes mellitus ([place])     Past Surgical History:  Procedure Laterality Date   ABDOMINAL HYSTERECTOMY     BUBBLE STUDY  04/17/2020   Procedure: BUBBLE STUDY;  Surgeon: Fay Records, MD;  Location: [facility];  Service: Cardiovascular;;   CHOLECYSTECTOMY     COLONOSCOPY     COLONOSCOPY WITH PROPOFOL N/A 05/07/2017   Procedure:  COLONOSCOPY WITH PROPOFOL;  Surgeon: Ronald Lobo, MD;  Location: [facility];  Service: Endoscopy;  Laterality: N/A;   CYSTECTOMY     LEFT BREAST   EYE SURGERY Bilateral    caratacts   REPLACEMENT TOTAL KNEE BILATERAL     REVERSE SHOULDER ARTHROPLASTY Right 09/29/2015   Procedure: RIGHT REVERSE TOTAL SHOULDER ARTHROPLASTY;  Surgeon: Netta Cedars, MD;  Location: [facility];  Service: Orthopedics;  Laterality: Right;   RIGHT/LEFT HEART CATH AND CORONARY ANGIOGRAPHY N/A 05/29/2020   Procedure: RIGHT/LEFT HEART CATH AND CORONARY ANGIOGRAPHY;  Surgeon: [name], MD;  Location: [facility];  Service: Cardiovascular;  Laterality: N/A;   TEE WITHOUT CARDIOVERSION N/A 04/17/2020   Procedure: TRANSESOPHAGEAL ECHOCARDIOGRAM (TEE);  Surgeon: Fay Records, MD;  Location: [facility] ENDOSCOPY;  Service: Cardiovascular;  Laterality: N/A;     Current Outpatient Medications  Medication Sig Dispense Refill   acetaminophen (TYLENOL) 650 MG CR tablet Take 1,300 mg by mouth every 8 (eight) hours as needed for pain.     bisoprolol (ZEBETA) 5 MG tablet Take 0.5 tablets (2.5 mg total) by mouth daily. 45 tablet 3   DULoxetine (CYMBALTA) 60  MG capsule Take 60 mg by mouth daily.     furosemide (LASIX) 20 MG tablet Take 2 tablets (40 mg total) by mouth daily. 180 tablet 3   gabapentin (NEURONTIN) 300 MG capsule Take 300 mg by mouth at bedtime.     glimepiride (AMARYL) 4 MG tablet Take 4 mg by mouth 2 (two) times daily.     hydrALAZINE (APRESOLINE) 25 MG tablet Take 25 mg by mouth 3 (three) times daily.     isosorbide dinitrate (ISORDIL) 20 MG tablet TAKE 1 TABLET BY MOUTH THREE TIMES A DAY 270 tablet 3   LANTUS SOLOSTAR 100 UNIT/ML Solostar Pen Inject 55 Units into the skin at bedtime.     levothyroxine (SYNTHROID) 75 MCG tablet Take 75 mcg by mouth daily before breakfast.     lovastatin (MEVACOR) 40 MG tablet Take 40 mg by mouth at bedtime.     metFORMIN (GLUCOPHAGE) 1000 MG tablet Take 1,000 mg by mouth 2  (two) times daily with a meal.     Dulaglutide (TRULICITY) 4.03 KV/4.2VZ SOPN Inject 0.75 mg as directed every Tuesday. (Patient not taking: Reported on 11/21/2021)     No current facility-administered medications for this visit.    Allergies:   Lisinopril, Actos [pioglitazone], Losartan, and Ciprofloxacin    ROS:  Please see the history of present illness.   Otherwise, review of systems are positive for bilateral knee pain.   All other systems are reviewed and negative.    PHYSICAL EXAM: VS:  BP 138/70   Pulse 69   Ht '5\' 2"'$  (1.575 m)   Wt 269 lb (122 kg)   BMI 49.20 kg/m  , BMI Body mass index is 49.2 kg/m.  GENERAL:  Well appearing NECK:  No jugular venous distention, waveform within normal limits, carotid upstroke brisk and symmetric, no bruits, no thyromegaly LUNGS:  Clear to auscultation bilaterally CHEST:  Unremarkable HEART:  PMI not displaced or sustained,S1 and S2 within normal limits, no S3, no S4, no clicks, no rubs, 3 out of 6 holosystolic murmur heard anteriorly no diastolic murmurs murmurs ABD:  Flat, positive bowel sounds normal in frequency in pitch, no bruits, no rebound, no guarding, no midline pulsatile mass, no hepatomegaly, no splenomegaly EXT:  2 plus pulses throughout, no edema, no cyanosis no clubbing   EKG:  EKG is ordered today. Sinus rhythm, rate 69, interventricular conduction delay, left axis deviation, no change from previous.   Recent Labs: No results found for requested labs within last 365 days.    Lipid Panel No results found for: "CHOL", "TRIG", "HDL", "CHOLHDL", "VLDL", "LDLCALC", "LDLDIRECT"    Wt Readings from Last 3 Encounters:  11/21/21 269 lb (122 kg)  07/30/21 264 lb 12.8 oz (120.1 kg)  06/06/21 263 lb (119.3 kg)      Other studies Reviewed: Additional studies/ records that were reviewed today include:   Labs Review of the above records demonstrates:  Please see elsewhere in the note.     ASSESSMENT AND PLAN:  MR:     She  had a TEE and this suggested that the MR was moderate to moderately severe.  I do not think correcting this will be her issue based on right heart pressures.  I think her pulmonary hypertension is multifactorial.  She needs weight loss and we had a long discussion about this.  I think this is life limiting.  I am going to repeat an echocardiogram.  I will increase her Lasix to 40 mg daily and check a  basic metabolic profile in about 10 days.  Pulmonary HTN:  This is likely secondary to OSA and obesity.   I do not think there is a primary component.  Her Woods units for 3.24.  I will repeat an echo as above.  Cardiomyopathy:  Her EF was 45%.  Of note she was taken off of bisoprolol apparently because of low blood pressures although she reports them to be pretty normal at home.  I am putting her back on a low-dose beta-blocker for management of the cardiomyopathy.  She has not tolerated hydralazine increased dose.  She has been intolerant with anaphylaxis to ACE inhibitors.  I will be reassessing the EF.  Morbid obesity:    I offered her a visit to weight loss clinic but she says transportation is an issue.  We talked about carbohydrates.   Chronic diastolic dysfunction:   This will be managed with conservative management with increased Lasix and decrease salt.   Small secundum ASD: As above she has had right heart pressures measured.   This is an incidental finding.  I do not think this is contributing.  Sleep apnea:    She had an abnormal home sleep study.  She declined CPAP.     Essential HTN:   This is being managed in the context of managing reduced ejection fraction.  He has  Current medicines are reviewed at length with the patient today.  The patient does not have concerns regarding medicines.  The following changes have been made: As above  Labs/ tests ordered today include:    Orders Placed This Encounter  Procedures   Basic metabolic panel   EKG 52-DPOE   ECHOCARDIOGRAM COMPLETE      Disposition:   FU me in about 2 months   Signed, Minus Breeding, MD  11/21/2021 10:13 AM    [place]

## 2021-11-20 NOTE — Telephone Encounter (Signed)
Informed patient that Dr. Percival Spanish will discuss swelling and medications at her appointment. She thanked me for the call.

## 2021-11-21 ENCOUNTER — Ambulatory Visit: Payer: Medicare HMO | Admitting: Cardiology

## 2021-11-21 ENCOUNTER — Encounter: Payer: Self-pay | Admitting: Cardiology

## 2021-11-21 ENCOUNTER — Other Ambulatory Visit: Payer: Self-pay | Admitting: *Deleted

## 2021-11-21 VITALS — BP 138/70 | HR 69 | Ht 62.0 in | Wt 269.0 lb

## 2021-11-21 DIAGNOSIS — I340 Nonrheumatic mitral (valve) insufficiency: Secondary | ICD-10-CM

## 2021-11-21 DIAGNOSIS — I5032 Chronic diastolic (congestive) heart failure: Secondary | ICD-10-CM

## 2021-11-21 DIAGNOSIS — Z79899 Other long term (current) drug therapy: Secondary | ICD-10-CM

## 2021-11-21 DIAGNOSIS — I272 Pulmonary hypertension, unspecified: Secondary | ICD-10-CM | POA: Diagnosis not present

## 2021-11-21 MED ORDER — FUROSEMIDE 20 MG PO TABS: 40.0000 mg | ORAL_TABLET | Freq: Every day | ORAL | 3 refills | Status: DC

## 2021-11-21 MED ORDER — BISOPROLOL FUMARATE 5 MG PO TABS: 2.5000 mg | ORAL_TABLET | Freq: Every day | ORAL | 3 refills | Status: DC

## 2021-11-21 NOTE — Patient Instructions (Signed)
Medication Instructions:  Please increase your Furosemide to 40 mg a day (take (2) 20 mg tablets. Start Bisoprolol 5 mg - 1/2 tablet daily. Continue all other medications as listed.  *If you need a refill on your cardiac medications before your next appointment, please call your pharmacy*   Lab Work: Please have blood work in approximately 10 days at your closest [place].  [facility]) If you have labs (blood work) drawn today and your tests are completely normal, you will receive your results only by: [place] (if you have [place]) OR A paper copy in the mail If you have any lab test that is abnormal or we need to change your treatment, we will call you to review the results.   Testing/Procedures: Your physician has requested that you have an echocardiogram. Echocardiography is a painless test that uses sound waves to create images of your heart. It provides your doctor with information about the size and shape of your heart and how well your heart's chambers and valves are working. This procedure takes approximately one hour. There are no restrictions for this procedure. This testing will be completed at [facility].  You will be contacted to be scheduled.   Follow-Up: At [facility], you and your health needs are our priority.  As part of our continuing mission to provide you with exceptional heart care, we have created designated Provider Care Teams.  These Care Teams include your primary Cardiologist (physician) and Advanced Practice Providers (APPs -  Physician Assistants and Nurse Practitioners) who all work together to provide you with the care you need, when you need it.  We recommend signing up for the patient portal called "MyChart".  Sign up information is provided on this After Visit Summary.  MyChart is used to connect with patients for Virtual Visits (Telemedicine).  Patients are able to view lab/test results, encounter notes, upcoming appointments, etc.  Non-urgent  messages can be sent to your provider as well.   To learn more about what you can do with MyChart, go to [email or web address].    Your next appointment:   2 month(s)  The format for your next appointment:   In Person  Provider:   Minus Breeding, MD     Important Information About Sugar

## 2021-11-22 ENCOUNTER — Ambulatory Visit: Payer: Medicare HMO | Attending: Cardiology

## 2021-11-22 DIAGNOSIS — I5032 Chronic diastolic (congestive) heart failure: Secondary | ICD-10-CM | POA: Diagnosis not present

## 2021-11-22 DIAGNOSIS — I340 Nonrheumatic mitral (valve) insufficiency: Secondary | ICD-10-CM

## 2021-11-22 LAB — ECHOCARDIOGRAM COMPLETE
AR max vel: 1.65 cm2
AV Peak grad: 8.8 mmHg
Ao pk vel: 1.49 m/s
Area-P 1/2: 2.96 cm2
Calc EF: 56.8 %
MV M vel: 4.65 m/s
MV Peak grad: 86.6 mmHg
P 1/2 time: 2009 msec
Radius: 0.4 cm
S' Lateral: 3.65 cm
Single Plane A2C EF: 59.8 %
Single Plane A4C EF: 55.6 %

## 2021-11-27 DIAGNOSIS — D509 Iron deficiency anemia, unspecified: Secondary | ICD-10-CM | POA: Diagnosis not present

## 2021-11-28 ENCOUNTER — Other Ambulatory Visit (HOSPITAL_COMMUNITY): Payer: Medicare HMO

## 2021-12-03 DIAGNOSIS — I272 Pulmonary hypertension, unspecified: Secondary | ICD-10-CM | POA: Diagnosis not present

## 2021-12-03 DIAGNOSIS — I5032 Chronic diastolic (congestive) heart failure: Secondary | ICD-10-CM | POA: Diagnosis not present

## 2021-12-03 DIAGNOSIS — Z79899 Other long term (current) drug therapy: Secondary | ICD-10-CM | POA: Diagnosis not present

## 2021-12-04 LAB — BASIC METABOLIC PANEL
BUN/Creatinine Ratio: 19 (ref 12–28)
BUN: 25 mg/dL (ref 8–27)
CO2: 23 mmol/L (ref 20–29)
Calcium: 9.3 mg/dL (ref 8.7–10.3)
Chloride: 99 mmol/L (ref 96–106)
Creatinine, Ser: 1.34 mg/dL — ABNORMAL HIGH (ref 0.57–1.00)
Glucose: 162 mg/dL — ABNORMAL HIGH (ref 70–99)
Potassium: 4.7 mmol/L (ref 3.5–5.2)
Sodium: 138 mmol/L (ref 134–144)
eGFR: 40 mL/min/{1.73_m2} — ABNORMAL LOW (ref >59)

## 2021-12-07 ENCOUNTER — Telehealth: Payer: Self-pay | Admitting: Cardiology

## 2021-12-07 ENCOUNTER — Other Ambulatory Visit: Payer: Self-pay | Admitting: *Deleted

## 2021-12-07 DIAGNOSIS — I5189 Other ill-defined heart diseases: Secondary | ICD-10-CM

## 2021-12-07 MED ORDER — HYDRALAZINE HCL 25 MG PO TABS: 25.0000 mg | ORAL_TABLET | Freq: Three times a day (TID) | ORAL | 3 refills | Status: DC

## 2021-12-07 NOTE — Telephone Encounter (Signed)
*  STAT* If patient is at the pharmacy, call can be transferred to refill team.   1. Which medications need to be refilled? (please list name of each medication and dose if known) hydrALAZINE (APRESOLINE) 25 MG tablet  2. Which pharmacy/location (including street and city if local pharmacy) is medication to be sent to? [place], [place]  3. Do they need a 30 day or 90 day supply? [place]

## 2021-12-12 ENCOUNTER — Ambulatory Visit: Payer: Medicare HMO | Admitting: Cardiology

## 2021-12-17 DIAGNOSIS — E1140 Type 2 diabetes mellitus with diabetic neuropathy, unspecified: Secondary | ICD-10-CM | POA: Diagnosis not present

## 2021-12-17 DIAGNOSIS — N289 Disorder of kidney and ureter, unspecified: Secondary | ICD-10-CM | POA: Diagnosis not present

## 2022-01-07 DIAGNOSIS — I5189 Other ill-defined heart diseases: Secondary | ICD-10-CM | POA: Diagnosis not present

## 2022-01-08 ENCOUNTER — Other Ambulatory Visit: Payer: Self-pay | Admitting: *Deleted

## 2022-01-08 DIAGNOSIS — I5032 Chronic diastolic (congestive) heart failure: Secondary | ICD-10-CM

## 2022-01-08 LAB — BASIC METABOLIC PANEL
BUN/Creatinine Ratio: 16 (ref 12–28)
BUN: 23 mg/dL (ref 8–27)
CO2: 22 mmol/L (ref 20–29)
Calcium: 9.2 mg/dL (ref 8.7–10.3)
Chloride: 105 mmol/L (ref 96–106)
Creatinine, Ser: 1.46 mg/dL — ABNORMAL HIGH (ref 0.57–1.00)
Glucose: 117 mg/dL — ABNORMAL HIGH (ref 70–99)
Potassium: 4.9 mmol/L (ref 3.5–5.2)
Sodium: 141 mmol/L (ref 134–144)
eGFR: 36 mL/min/{1.73_m2} — ABNORMAL LOW (ref >59)

## 2022-01-21 NOTE — Progress Notes (Unsigned)
Cardiology Office Note   Date:  01/23/2022   ID:  Melissa Zhang, Melissa Zhang [birth year], MRN [id number]  PCP:  Aura Dials, MD  Cardiologist:   Minus Breeding, MD   Chief Complaint  Patient presents with   Shortness of Breath      History of Present Illness: Melissa Zhang is a 81 y.o. female who presents for follow up of valvular heart disease.  Echo in 2018 demonstrated an EF of 50 - 55% with moderate MR and a PFO vs. ASD.    I saw her for the first time in December 2021.  Follow up echo demonstrated that the that her EF was down to 45%.  Ejection fraction is slightly reduced compared to previous.   I sent her for a TEE and this was moderately severe.    I sent her for a cardiac cath with non obstructive CAD and severe pulmonary HTN.     I sent her for another echo in October.  Her pulmonary pressures was  increased compared to previous.  This appeared to be  multifactorial with her sleep apnea and morbid obesity.  At the last appointment I increased her Lasix.  She has lost 10 pounds.  Her creatinine was up slightly and she is due to have this followed up in December.  She thinks her breathing might be slightly better.  Her lower extremity swelling which is nonpitting is about the same.  She sleeps chronically in a chair because of back problems.  She is not having any new PND or orthopnea.  She is chronically short of breath with moderate activity but this is unchanged.  She has had no new chest pressure.   Past Medical History:  Diagnosis Date   Allergic rhinitis    Esophageal reflux    Hypertension    Low back pain    Mixed hyperlipidemia    Obstructive sleep apnea    Peripheral neuropathy    Pneumonia    history   Pulmonary hypertension ([place])    Type 2 diabetes mellitus ([place])     Past Surgical History:  Procedure Laterality Date   ABDOMINAL HYSTERECTOMY     BUBBLE STUDY  04/17/2020   Procedure: BUBBLE STUDY;  Surgeon: Fay Records, MD;  Location: [facility];   Service: Cardiovascular;;   CHOLECYSTECTOMY     COLONOSCOPY     COLONOSCOPY WITH PROPOFOL N/A 05/07/2017   Procedure: COLONOSCOPY WITH PROPOFOL;  Surgeon: Ronald Lobo, MD;  Location: [facility];  Service: Endoscopy;  Laterality: N/A;   CYSTECTOMY     LEFT BREAST   EYE SURGERY Bilateral    caratacts   REPLACEMENT TOTAL KNEE BILATERAL     REVERSE SHOULDER ARTHROPLASTY Right 09/29/2015   Procedure: RIGHT REVERSE TOTAL SHOULDER ARTHROPLASTY;  Surgeon: Netta Cedars, MD;  Location: [facility];  Service: Orthopedics;  Laterality: Right;   RIGHT/LEFT HEART CATH AND CORONARY ANGIOGRAPHY N/A 05/29/2020   Procedure: RIGHT/LEFT HEART CATH AND CORONARY ANGIOGRAPHY;  Surgeon: [name], MD;  Location: [facility];  Service: Cardiovascular;  Laterality: N/A;   TEE WITHOUT CARDIOVERSION N/A 04/17/2020   Procedure: TRANSESOPHAGEAL ECHOCARDIOGRAM (TEE);  Surgeon: Fay Records, MD;  Location: [facility];  Service: Cardiovascular;  Laterality: N/A;     Current Outpatient Medications  Medication Sig Dispense Refill   acetaminophen (TYLENOL) 650 MG CR tablet Take 1,300 mg by mouth every 8 (eight) hours as needed for pain.     bisoprolol (ZEBETA) 5 MG tablet  Take 1 tablet (5 mg total) by mouth daily. 90 tablet 3   DULoxetine (CYMBALTA) 60 MG capsule Take 60 mg by mouth daily.     furosemide (LASIX) 20 MG tablet Take 2 tablets (40 mg total) by mouth daily. 180 tablet 3   gabapentin (NEURONTIN) 300 MG capsule Take 300 mg by mouth at bedtime.     glimepiride (AMARYL) 4 MG tablet Take 4 mg by mouth 2 (two) times daily.     hydrALAZINE (APRESOLINE) 25 MG tablet Take 1 tablet (25 mg total) by mouth 3 (three) times daily. 90 tablet 3   isosorbide dinitrate (ISORDIL) 20 MG tablet TAKE 1 TABLET BY MOUTH THREE TIMES A DAY 270 tablet 3   LANTUS SOLOSTAR 100 UNIT/ML Solostar Pen Inject 55 Units into the skin at bedtime.     levothyroxine (SYNTHROID) 75 MCG tablet Take 75 mcg by mouth daily before breakfast.      lovastatin (MEVACOR) 40 MG tablet Take 40 mg by mouth at bedtime.     metFORMIN (GLUCOPHAGE) 1000 MG tablet Take 1,000 mg by mouth 2 (two) times daily with a meal.     Dulaglutide (TRULICITY) 5.63 JS/9.7WY SOPN Inject 0.75 mg as directed every Tuesday. (Patient not taking: Reported on 11/21/2021)     No current facility-administered medications for this visit.    Allergies:   Lisinopril, Actos [pioglitazone], Jardiance [empagliflozin], Losartan, and Ciprofloxacin    ROS:  Please see the history of present illness.   Otherwise, review of systems are positive for none.   All other systems are reviewed and negative.    PHYSICAL EXAM: VS:  BP (!) 148/72   Pulse 68   Ht '5\' 2"'$  (1.575 m)   Wt 259 lb 9.6 oz (117.8 kg)   SpO2 99%   BMI 47.48 kg/m  , BMI Body mass index is 47.48 kg/m.  GENERAL:  Well appearing NECK:  No jugular venous distention, waveform within normal limits, carotid upstroke brisk and symmetric, no bruits, no thyromegaly LUNGS:  Clear to auscultation bilaterally CHEST:  Unremarkable HEART:  PMI not displaced or sustained,S1 and S2 within normal limits, no S3, no S4, no clicks, no rubs, no murmurs ABD:  Flat, positive bowel sounds normal in frequency in pitch, no bruits, no rebound, no guarding, no midline pulsatile mass, no hepatomegaly, no splenomegaly EXT:  2 plus pulses throughout, mild edema, no cyanosis no clubbing    EKG:  EKG is not ordered today.   Recent Labs: 01/07/2022: BUN 23; Creatinine, Ser 1.46; Potassium 4.9; Sodium 141    Lipid Panel No results found for: "CHOL", "TRIG", "HDL", "CHOLHDL", "VLDL", "LDLCALC", "LDLDIRECT"    Wt Readings from Last 3 Encounters:  01/23/22 259 lb 9.6 oz (117.8 kg)  11/21/21 269 lb (122 kg)  07/30/21 264 lb 12.8 oz (120.1 kg)      Other studies Reviewed: Additional studies/ records that were reviewed today include:   Labs Review of the above records demonstrates:  Please see elsewhere in the note.      ASSESSMENT AND PLAN:  MR:   This was moderate on echocardiogram.     Pulmonary HTN:  This is likely secondary to OSA and obesity.   I do not think there is a primary component.  Her [place] units for 3.24.  Again we are managing this conservatively and I think there is been some improvement.  The estimated right ventricular systolic pressure on echo in October was 60.8.  Cardiomyopathy:  Her EF was increased to 50  to 55% from 45%.  She actually is tolerating being back on bisoprolol which she did not tolerate previously.  Because of some low blood pressures.  She actually says it is hard to cut the pill in half and wants to try to hold 5 mg) in favor of this.  Of note she has not tolerated GLP-1 receptor agonists.  She has not tolerated SGLT2 inhibitors.    Morbid obesity:    She has not wanted enrollment in a weight loss clinic because of transportation.  Chronic diastolic dysfunction:   I will be increasing the beta-blocker and checking her labs today as she remains on the higher dose of diuretic.  We talked about salt and fluid restriction again.  Small secundum ASD: As above she has had right heart pressures measured.   This is an incidental finding.  No change in therapy  Sleep apnea:    She had an abnormal home sleep study.  She previously declined CPAP.  Essential HTN:   This is being managed in the context of managing reduced ejection fraction.   Current medicines are reviewed at length with the patient today.  The patient does not have concerns regarding medicines.  The following changes have been made: As above  Labs/ tests ordered today include:    No orders of the defined types were placed in this encounter.    Disposition:   FU me in about 6 months   Signed, Minus Breeding, MD  01/23/2022 10:08 AM    [place]

## 2022-01-23 ENCOUNTER — Ambulatory Visit: Payer: Medicare HMO | Admitting: Cardiology

## 2022-01-23 ENCOUNTER — Encounter: Payer: Self-pay | Admitting: Cardiology

## 2022-01-23 VITALS — BP 148/72 | HR 68 | Ht 62.0 in | Wt 259.6 lb

## 2022-01-23 DIAGNOSIS — I272 Pulmonary hypertension, unspecified: Secondary | ICD-10-CM

## 2022-01-23 DIAGNOSIS — I420 Dilated cardiomyopathy: Secondary | ICD-10-CM

## 2022-01-23 DIAGNOSIS — I340 Nonrheumatic mitral (valve) insufficiency: Secondary | ICD-10-CM | POA: Diagnosis not present

## 2022-01-23 DIAGNOSIS — I5032 Chronic diastolic (congestive) heart failure: Secondary | ICD-10-CM

## 2022-01-23 DIAGNOSIS — I10 Essential (primary) hypertension: Secondary | ICD-10-CM | POA: Diagnosis not present

## 2022-01-23 MED ORDER — BISOPROLOL FUMARATE 5 MG PO TABS: 5.0000 mg | ORAL_TABLET | Freq: Every day | ORAL | 3 refills | Status: DC

## 2022-01-23 NOTE — Patient Instructions (Signed)
Medication Instructions:  Please increase Bisoprolol to 5 mg by mouth daily. Continue all other medications as listed.  *If you need a refill on your cardiac medications before your next appointment, please call your pharmacy*  Follow-Up: At [facility], you and your health needs are our priority.  As part of our continuing mission to provide you with exceptional heart care, we have created designated Provider Care Teams.  These Care Teams include your primary Cardiologist (physician) and Advanced Practice Providers (APPs -  Physician Assistants and Nurse Practitioners) who all work together to provide you with the care you need, when you need it.  We recommend signing up for the patient portal called "MyChart".  Sign up information is provided on this After Visit Summary.  MyChart is used to connect with patients for Virtual Visits (Telemedicine).  Patients are able to view lab/test results, encounter notes, upcoming appointments, etc.  Non-urgent messages can be sent to your provider as well.   To learn more about what you can do with MyChart, go to [email or web address].    Your next appointment:   6 month(s)  The format for your next appointment:   In Person  Provider:   Dr Minus Breeding in [place]

## 2022-01-30 DIAGNOSIS — E039 Hypothyroidism, unspecified: Secondary | ICD-10-CM | POA: Diagnosis not present

## 2022-01-30 DIAGNOSIS — E785 Hyperlipidemia, unspecified: Secondary | ICD-10-CM | POA: Diagnosis not present

## 2022-01-30 DIAGNOSIS — I10 Essential (primary) hypertension: Secondary | ICD-10-CM | POA: Diagnosis not present

## 2022-01-30 DIAGNOSIS — N183 Chronic kidney disease, stage 3 unspecified: Secondary | ICD-10-CM | POA: Diagnosis not present

## 2022-01-30 DIAGNOSIS — G8929 Other chronic pain: Secondary | ICD-10-CM | POA: Diagnosis not present

## 2022-01-30 DIAGNOSIS — E1140 Type 2 diabetes mellitus with diabetic neuropathy, unspecified: Secondary | ICD-10-CM | POA: Diagnosis not present

## 2022-01-30 DIAGNOSIS — I5032 Chronic diastolic (congestive) heart failure: Secondary | ICD-10-CM | POA: Diagnosis not present

## 2022-02-06 DIAGNOSIS — I5032 Chronic diastolic (congestive) heart failure: Secondary | ICD-10-CM | POA: Diagnosis not present

## 2022-02-07 LAB — BASIC METABOLIC PANEL
BUN/Creatinine Ratio: 19 (ref 12–28)
BUN: 23 mg/dL (ref 8–27)
CO2: 24 mmol/L (ref 20–29)
Calcium: 9.2 mg/dL (ref 8.7–10.3)
Chloride: 104 mmol/L (ref 96–106)
Creatinine, Ser: 1.23 mg/dL — ABNORMAL HIGH (ref 0.57–1.00)
Glucose: 105 mg/dL — ABNORMAL HIGH (ref 70–99)
Potassium: 4.8 mmol/L (ref 3.5–5.2)
Sodium: 140 mmol/L (ref 134–144)
eGFR: 45 mL/min/{1.73_m2} — ABNORMAL LOW (ref >59)

## 2022-02-21 ENCOUNTER — Telehealth: Payer: Self-pay | Admitting: Cardiology

## 2022-02-21 MED ORDER — BISOPROLOL FUMARATE 5 MG PO TABS: 5.0000 mg | ORAL_TABLET | Freq: Every day | ORAL | 3 refills | Status: DC

## 2022-02-21 NOTE — Telephone Encounter (Signed)
Pharmacy calling to f/u on fax sent on 11/30 for pt's medications to be filed through them. They will fax again today. Please advise

## 2022-02-21 NOTE — Telephone Encounter (Signed)
Spoke with pt, aware pharmacy updated in her chart. She is not needing any refills at this time.

## 2022-03-08 ENCOUNTER — Telehealth: Payer: Self-pay | Admitting: Cardiology

## 2022-03-08 NOTE — Telephone Encounter (Signed)
*  STAT* If patient is at the pharmacy, call can be transferred to refill team.   1. Which medications need to be refilled? (please list name of each medication and dose if known) bisoprolol (ZEBETA) 5 MG tablet   furosemide (LASIX) 20 MG tablet   hydrALAZINE (APRESOLINE) 25 MG tablet   isosorbide dinitrate (ISORDIL) 20 MG tablet   2. Which pharmacy/location (including street and city if local pharmacy) is medication to be sent to? [facility] - [place] [address]   3. Do they need a 30 day or 90 day supply? [place]

## 2022-03-11 MED ORDER — HYDRALAZINE HCL 25 MG PO TABS: 25.0000 mg | ORAL_TABLET | Freq: Three times a day (TID) | ORAL | 3 refills | Status: DC

## 2022-03-11 MED ORDER — BISOPROLOL FUMARATE 5 MG PO TABS: 5.0000 mg | ORAL_TABLET | Freq: Every day | ORAL | 3 refills | Status: DC

## 2022-03-11 MED ORDER — ISOSORBIDE DINITRATE 20 MG PO TABS: 20.0000 mg | ORAL_TABLET | Freq: Three times a day (TID) | ORAL | 3 refills | Status: DC

## 2022-03-11 MED ORDER — FUROSEMIDE 20 MG PO TABS: 40.0000 mg | ORAL_TABLET | Freq: Every day | ORAL | 3 refills | Status: DC

## 2022-03-20 NOTE — Telephone Encounter (Signed)
*  STAT* If patient is at the pharmacy, call can be transferred to refill team.   1. Which medications need to be refilled? (please list name of each medication and dose if known)   bisoprolol (ZEBETA) 5 MG tablet  furosemide (LASIX) 20 MG tablet  hydrALAZINE (APRESOLINE) 25 MG tablet  isosorbide dinitrate (ISORDIL) 20 MG tablet   2. Which pharmacy/location (including street and city if local pharmacy) is medication to be sent to?  [facility] - [place] - [address]   3. Do they need a 30 day or 90 day supply?  90 day   Caller stated patient is transferring her medications to this pharmacy.

## 2022-03-27 DIAGNOSIS — E1140 Type 2 diabetes mellitus with diabetic neuropathy, unspecified: Secondary | ICD-10-CM | POA: Diagnosis not present

## 2022-04-04 DIAGNOSIS — E1140 Type 2 diabetes mellitus with diabetic neuropathy, unspecified: Secondary | ICD-10-CM | POA: Diagnosis not present

## 2022-04-04 DIAGNOSIS — I5032 Chronic diastolic (congestive) heart failure: Secondary | ICD-10-CM | POA: Diagnosis not present

## 2022-04-04 DIAGNOSIS — N183 Chronic kidney disease, stage 3 unspecified: Secondary | ICD-10-CM | POA: Diagnosis not present

## 2022-04-04 DIAGNOSIS — G8929 Other chronic pain: Secondary | ICD-10-CM | POA: Diagnosis not present

## 2022-04-04 DIAGNOSIS — I10 Essential (primary) hypertension: Secondary | ICD-10-CM | POA: Diagnosis not present

## 2022-04-04 DIAGNOSIS — E785 Hyperlipidemia, unspecified: Secondary | ICD-10-CM | POA: Diagnosis not present

## 2022-04-04 DIAGNOSIS — E039 Hypothyroidism, unspecified: Secondary | ICD-10-CM | POA: Diagnosis not present

## 2022-04-08 DIAGNOSIS — I10 Essential (primary) hypertension: Secondary | ICD-10-CM | POA: Diagnosis not present

## 2022-04-08 DIAGNOSIS — Z6841 Body Mass Index (BMI) 40.0 and over, adult: Secondary | ICD-10-CM | POA: Diagnosis not present

## 2022-04-08 DIAGNOSIS — I340 Nonrheumatic mitral (valve) insufficiency: Secondary | ICD-10-CM | POA: Diagnosis not present

## 2022-04-08 DIAGNOSIS — I272 Pulmonary hypertension, unspecified: Secondary | ICD-10-CM | POA: Diagnosis not present

## 2022-04-08 DIAGNOSIS — D126 Benign neoplasm of colon, unspecified: Secondary | ICD-10-CM | POA: Diagnosis not present

## 2022-04-08 DIAGNOSIS — E1140 Type 2 diabetes mellitus with diabetic neuropathy, unspecified: Secondary | ICD-10-CM | POA: Diagnosis not present

## 2022-04-08 DIAGNOSIS — Z0000 Encounter for general adult medical examination without abnormal findings: Secondary | ICD-10-CM | POA: Diagnosis not present

## 2022-04-08 DIAGNOSIS — E785 Hyperlipidemia, unspecified: Secondary | ICD-10-CM | POA: Diagnosis not present

## 2022-04-08 DIAGNOSIS — N289 Disorder of kidney and ureter, unspecified: Secondary | ICD-10-CM | POA: Diagnosis not present

## 2022-04-19 ENCOUNTER — Other Ambulatory Visit: Payer: Self-pay | Admitting: *Deleted

## 2022-04-19 ENCOUNTER — Other Ambulatory Visit: Payer: Self-pay | Admitting: Cardiology

## 2022-04-19 MED ORDER — BISOPROLOL FUMARATE 5 MG PO TABS: 5.0000 mg | ORAL_TABLET | Freq: Every day | ORAL | 3 refills | Status: DC

## 2022-04-19 MED ORDER — ISOSORBIDE DINITRATE 20 MG PO TABS: 20.0000 mg | ORAL_TABLET | Freq: Three times a day (TID) | ORAL | 3 refills | Status: DC

## 2022-04-28 ENCOUNTER — Other Ambulatory Visit: Payer: Self-pay

## 2022-04-28 ENCOUNTER — Encounter (HOSPITAL_COMMUNITY): Payer: Self-pay | Admitting: *Deleted

## 2022-04-28 ENCOUNTER — Emergency Department (HOSPITAL_COMMUNITY)
Admission: EM | Admit: 2022-04-28 | Discharge: 2022-04-28 | Disposition: A | Discharge status: Home / Self Care | Payer: Medicare PPO | Attending: Emergency Medicine | Admitting: Emergency Medicine

## 2022-04-28 DIAGNOSIS — X58XXXA Exposure to other specified factors, initial encounter: Secondary | ICD-10-CM | POA: Diagnosis not present

## 2022-04-28 DIAGNOSIS — Z794 Long term (current) use of insulin: Secondary | ICD-10-CM | POA: Insufficient documentation

## 2022-04-28 DIAGNOSIS — I509 Heart failure, unspecified: Secondary | ICD-10-CM | POA: Insufficient documentation

## 2022-04-28 DIAGNOSIS — Z7984 Long term (current) use of oral hypoglycemic drugs: Secondary | ICD-10-CM | POA: Diagnosis not present

## 2022-04-28 DIAGNOSIS — I110 Hypertensive heart disease with heart failure: Secondary | ICD-10-CM | POA: Diagnosis not present

## 2022-04-28 DIAGNOSIS — D649 Anemia, unspecified: Secondary | ICD-10-CM | POA: Insufficient documentation

## 2022-04-28 DIAGNOSIS — M542 Cervicalgia: Secondary | ICD-10-CM | POA: Diagnosis present

## 2022-04-28 DIAGNOSIS — S161XXA Strain of muscle, fascia and tendon at neck level, initial encounter: Secondary | ICD-10-CM | POA: Diagnosis not present

## 2022-04-28 DIAGNOSIS — I10 Essential (primary) hypertension: Secondary | ICD-10-CM | POA: Diagnosis not present

## 2022-04-28 DIAGNOSIS — Z79899 Other long term (current) drug therapy: Secondary | ICD-10-CM | POA: Diagnosis not present

## 2022-04-28 DIAGNOSIS — E119 Type 2 diabetes mellitus without complications: Secondary | ICD-10-CM | POA: Diagnosis not present

## 2022-04-28 DIAGNOSIS — D72829 Elevated white blood cell count, unspecified: Secondary | ICD-10-CM | POA: Insufficient documentation

## 2022-04-28 LAB — CBC
HCT: 33.4 % — ABNORMAL LOW (ref 36.0–46.0)
Hemoglobin: 10.7 g/dL — ABNORMAL LOW (ref 12.0–15.0)
MCH: 30.3 pg (ref 26.0–34.0)
MCHC: 32 g/dL (ref 30.0–36.0)
MCV: 94.6 fL (ref 80.0–100.0)
Platelets: 286 10*3/uL (ref 150–400)
RBC: 3.53 MIL/uL — ABNORMAL LOW (ref 3.87–5.11)
RDW: 13.9 % (ref 11.5–15.5)
WBC: 10.9 10*3/uL — ABNORMAL HIGH (ref 4.0–10.5)
nRBC: 0 % (ref 0.0–0.2)

## 2022-04-28 LAB — BASIC METABOLIC PANEL
Anion gap: 10 (ref 5–15)
BUN: 28 mg/dL — ABNORMAL HIGH (ref 8–23)
CO2: 24 mmol/L (ref 22–32)
Calcium: 8.9 mg/dL (ref 8.9–10.3)
Chloride: 101 mmol/L (ref 98–111)
Creatinine, Ser: 1.26 mg/dL — ABNORMAL HIGH (ref 0.44–1.00)
GFR, Estimated: 43 mL/min — ABNORMAL LOW (ref >60)
Glucose, Bld: 161 mg/dL — ABNORMAL HIGH (ref 70–99)
Potassium: 4.2 mmol/L (ref 3.5–5.1)
Sodium: 135 mmol/L (ref 135–145)

## 2022-04-28 MED ORDER — CYCLOBENZAPRINE HCL 10 MG PO TABS
5.0000 mg | ORAL_TABLET | Freq: Once | ORAL | Status: AC
  Administered 2022-04-28: 5 mg via ORAL
  Filled 2022-04-28: qty 1

## 2022-04-28 MED ORDER — KETOROLAC TROMETHAMINE 30 MG/ML IJ SOLN
30.0000 mg | Freq: Once | INTRAMUSCULAR | Status: AC
  Administered 2022-04-28: 30 mg via INTRAMUSCULAR
  Filled 2022-04-28: qty 1

## 2022-04-28 MED ORDER — CYCLOBENZAPRINE HCL 10 MG PO TABS: 10.0000 mg | ORAL_TABLET | Freq: Two times a day (BID) | ORAL | 0 refills | Status: AC | PRN

## 2022-04-28 NOTE — ED Provider Notes (Signed)
[facility] Provider Note   CSN: [id number] Arrival date & time: 04/28/22  [id number]     History  Chief Complaint  Patient presents with   Torticollis    Melissa Zhang is a 82 y.o. female.  HPI   82 year old female presents emergency department with complaints of left neck pain.  Patient states that symptoms began yesterday after awakening from sleep.  States his symptoms have worsened since onset.  Describes pain as constant in nature and is worsened with movements of the head.  Denies fever, chills, cough, congestion, chest pain, shortness of breath, weakness/sensory deficits in upper or lower extremities, slurred speech, facial droop, visual disturbance from baseline, gait abnormality, abdominal pain, nausea, vomiting, urinary symptoms, change in bowel habits.  Has taken Tylenol for symptoms which has helped some.  States history of similar symptoms in the past but "not this bad."  Presents emergency department for further evaluation.  Past medical history significant for diabetes mellitus, hypertension, pulmonary hypertension, GERD, OSA, peripheral neuropathy, left ventricular diastolic dysfunction, CHF with left ventricular ejection fraction of 50 to 55%, dilated cardiomyopathy, obesity, mild nonobstructing LAD and RCA,  Home Medications Prior to Admission medications   Medication Sig Start Date End Date Taking? Authorizing Provider  acetaminophen (TYLENOL) 650 MG CR tablet Take 1,300 mg by mouth every 8 (eight) hours as needed for pain.    [provider]  bisoprolol (ZEBETA) 5 MG tablet Take 1 tablet (5 mg total) by mouth daily. 04/19/22   Minus Breeding, MD  Dulaglutide (TRULICITY) [id number] SOPN Inject 0.75 mg as directed every Tuesday. Patient not taking: Reported on 11/21/2021 04/07/20   [provider]  DULoxetine (CYMBALTA) 60 MG capsule Take 60 mg by mouth daily. 03/05/13   [provider]  furosemide  (LASIX) 20 MG tablet Take 2 tablets (40 mg total) by mouth daily. 03/11/22   Minus Breeding, MD  gabapentin (NEURONTIN) 300 MG capsule Take 300 mg by mouth at bedtime. 04/11/13   [provider]  glimepiride (AMARYL) 4 MG tablet Take 4 mg by mouth 2 (two) times daily. 02/26/13   [provider]  hydrALAZINE (APRESOLINE) 25 MG tablet Take 1 tablet (25 mg total) by mouth 3 (three) times daily. 03/11/22   Minus Breeding, MD  isosorbide dinitrate (ISORDIL) 20 MG tablet Take 1 tablet (20 mg total) by mouth 3 (three) times daily. 04/19/22   Minus Breeding, MD  LANTUS SOLOSTAR 100 UNIT/ML Solostar Pen Inject 55 Units into the skin at bedtime. 03/01/13   [provider]  levothyroxine (SYNTHROID) 75 MCG tablet Take 75 mcg by mouth daily before breakfast. 10/22/21   [provider]  lovastatin (MEVACOR) 40 MG tablet Take 40 mg by mouth at bedtime. 03/26/13   [provider]  metFORMIN (GLUCOPHAGE) 1000 MG tablet Take 1,000 mg by mouth 2 (two) times daily with a meal. 10/19/21   [provider]      Allergies    Lisinopril, Actos [pioglitazone], Jardiance [empagliflozin], Losartan, and Ciprofloxacin    Review of Systems   Review of Systems  All other systems reviewed and are negative.   Physical Exam Updated Vital Signs BP (!) 142/65 (BP Location: Left Arm)   Pulse 76   Temp 98 F (36.7 C) (Oral)   Resp 20   Ht '5\' 2"'$  (1.575 m)   Wt 113.4 kg   SpO2 92%   BMI 45.73 kg/m  Physical Exam Vitals and nursing note reviewed.  Constitutional:      General: She is not in acute distress.    Appearance: She is well-developed.  HENT:     Head: Normocephalic and atraumatic.  Eyes:     Conjunctiva/sclera: Conjunctivae normal.  Neck:     Comments: Kernig and Brudzinski negative. Cardiovascular:     Rate and Rhythm: Normal rate and regular rhythm.     Heart sounds: No murmur heard. Pulmonary:     Effort: Pulmonary effort is normal. No respiratory distress.      Breath sounds: Normal breath sounds.  Abdominal:     Palpations: Abdomen is soft.     Tenderness: There is no abdominal tenderness.  Musculoskeletal:        General: No swelling.     Cervical back: Neck supple.     Comments: No midline tenderness of cervical, thoracic, lumbar spine with no obvious step-off or deformity noted.  Patient has tender to palpation left paraspinal in the cervical and upper thoracic region with radiation leftward of trapezius.  No overlying skin abnormalities appreciated.  Patient has pain with horizontal rotation of neck bilaterally.  Radial pulses 2+ bilaterally.  Muscular strength out of 5 and full grip, elbow flexion/extension.  No sensory deficits along major nerve distributions of upper extremities.  Skin:    General: Skin is warm and dry.     Capillary Refill: Capillary refill takes less than 2 seconds.  Neurological:     Mental Status: She is alert.     Comments: Alert and oriented to self, place, time and event.   Speech is fluent, clear without dysarthria or dysphasia.   Strength symmetric in upper/lower extremities.  Patient moves all 4 extremities without difficulty. Sensation intact in upper/lower extremities   CN I not tested  CN II not tested CN III, IV, VI PERRLA and EOMs intact bilaterally  CN V Intact sensation to sharp and light touch to the face  CN VII facial movements symmetric  CN VIII not tested  CN IX, X no uvula deviation, symmetric rise of soft palate  CN XI 5/5 SCM and trapezius strength bilaterally  CN XII Midline tongue protrusion, symmetric L/R movements     Psychiatric:        Mood and Affect: Mood normal.     ED Results / Procedures / Treatments   Labs (all labs ordered are listed, but only abnormal results are displayed) Labs Reviewed  CBC - Abnormal; Notable for the following components:      Result Value   WBC 10.9 (*)    RBC 3.53 (*)    Hemoglobin 10.7 (*)    HCT 33.4 (*)    All other components within  normal limits  BASIC METABOLIC PANEL - Abnormal; Notable for the following components:   Glucose, Bld 161 (*)    BUN 28 (*)    Creatinine, Ser 1.26 (*)    GFR, Estimated 43 (*)    All other components within normal limits    EKG EKG Interpretation  Date/Time:  Sunday April 28 2022 10:31:18 EDT Ventricular Rate:  72 PR Interval:  175 QRS Duration: 130 QT Interval:  459 QTC Calculation: 503 R Axis:   -33 Text Interpretation: Sinus rhythm Left bundle branch block since last tracing no significant change Confirmed by Noemi Chapel [phone]) on 04/28/2022 10:56:20 AM  Radiology No results found.  Procedures Procedures    Medications Ordered in ED Medications  ketorolac (TORADOL) 30 MG/ML injection 30 mg (30 mg Intramuscular Given 04/28/22 1038)  cyclobenzaprine (FLEXERIL) tablet 5 mg (5 mg Oral Given 04/28/22 1036)    ED Course/ Medical Decision Making/ A&P                             Medical Decision Making Amount and/or Complexity of Data Reviewed Labs: ordered.  Risk Prescription drug management.   This patient presents to the ED for concern of neck pain, this involves an extensive number of treatment options, and is a complaint that carries with it a high risk of complications and morbidity.  The differential diagnosis includes meningitis/encephalitis, CVA, prevertebral artery/carotid artery dissection, fracture, dislocation, muscular strain/sprain, spinal cord injury   Co morbidities that complicate the patient evaluation  See HPI   Additional history obtained:  Additional history obtained from EMR External records from outside source obtained and reviewed including hospital records   Lab Tests:  I Ordered, and personally interpreted labs.  The pertinent results include: Mild leukocytosis of 10.9.  Evidence of anemia with hemoglobin 10.7.  Patient has baseline anemia and hemoglobin stable from prior laboratory studies.  Patient with evidence of renal  dysfunction new baseline with creatinine 1.26, BUN 28 and GFR 43.  No electrolyte abnormalities appreciated.   Imaging Studies ordered:  N/a   Cardiac Monitoring: / EKG:  The patient was maintained on a cardiac monitor.  I personally viewed and interpreted the cardiac monitored which showed an underlying rhythm of: Sinus rhythm with left bundle branch block of which is present on prior EKGs.  No evidence of acute ischemic changes.   Consultations Obtained:  I requested consultation with attending physician Dr. Sabra Heck who is in agreement with treatment plan going forward.   Problem List / ED Course / Critical interventions / Medication management  Neck Pain I ordered medication including toradol, flexeril   Reevaluation of the patient after these medicines showed that the patient improved I have reviewed the patients home medicines and have made adjustments as needed   Social Determinants of Health:  Denies tobacco/illicit drug use   Test / Admission - Considered:  Left neck pain Vitals signs significant for mild hypertension with blood pressure 142/65.  Recommend follow-up with primary care regarding ablation blood pressure.. Otherwise within normal range and stable throughout visit. Laboratory studies significant for: See above Patient with evidence of left-sided neck pain.  Favor muscular etiology given reproducibility on exam and description of onset of symptoms.  Less likely vertebral artery/carotid artery dissection, meningitis, acute fracture/dislocation, spinal cord injury.  Patient without evidence of fever, neurologic deficit.  Recommend treatment at home with muscle laxer to use as needed.  Patient educated regarding harmful side effect profile of muscle relaxers of increased falls/drowsiness.  Patient also recommended Tylenol as well as rehabilitation exercises as provided.  Recommend follow-up with primary care for reassessment of symptoms.  Treatment plan discussed at  length with patient and she acknowledged understanding was agreeable to said plan. Worrisome signs and symptoms were discussed with the patient, and the patient acknowledged understanding to return to the ED if noticed. Patient was stable upon discharge.          Final Clinical Impression(s) / ED Diagnoses Final diagnoses:  Neck muscle strain, initial encounter    Rx / DC Orders ED Discharge Orders     None         Wilnette Kales, [place] 04/28/22 1225    Noemi Chapel, MD 04/28/22 [phone]

## 2022-04-28 NOTE — ED Triage Notes (Signed)
Pt c/o stiff neck that radiates into the back of her head when she woke up. Pt reports pain with moving her head side to side. Only slight pain with moving her pain up and down. Denies injury. Pt took Tylenol last night at 2230 with no relief in pain.

## 2022-04-28 NOTE — Discharge Instructions (Addendum)
Note the workup today was overall reassuring.  As discussed, recommend continued use of Tylenol outpatient for pain.  Will prescribe muscle laxer to use as needed.  Note that this medication can cause drowsiness and increase your fall risk so please be careful when taking said medication.  I will also send in numbing patches to put over affected area.  See attached information for rehabilitation exercises.  Recommend reevaluation by your primary care for reassessment of your symptoms.  Please not hesitate to return to emergency department for worrisome signs and symptoms we discussed become apparent.

## 2022-05-03 ENCOUNTER — Emergency Department (HOSPITAL_COMMUNITY)
Admission: EM | Admit: 2022-05-03 | Discharge: 2022-05-03 | Disposition: A | Discharge status: Home / Self Care | Payer: Medicare PPO | Attending: Emergency Medicine | Admitting: Emergency Medicine

## 2022-05-03 ENCOUNTER — Emergency Department (HOSPITAL_COMMUNITY): Payer: Medicare PPO

## 2022-05-03 ENCOUNTER — Other Ambulatory Visit: Payer: Self-pay

## 2022-05-03 DIAGNOSIS — I10 Essential (primary) hypertension: Secondary | ICD-10-CM | POA: Diagnosis not present

## 2022-05-03 DIAGNOSIS — Z79899 Other long term (current) drug therapy: Secondary | ICD-10-CM | POA: Insufficient documentation

## 2022-05-03 DIAGNOSIS — R531 Weakness: Secondary | ICD-10-CM | POA: Diagnosis present

## 2022-05-03 DIAGNOSIS — R29818 Other symptoms and signs involving the nervous system: Secondary | ICD-10-CM

## 2022-05-03 DIAGNOSIS — E11649 Type 2 diabetes mellitus with hypoglycemia without coma: Secondary | ICD-10-CM | POA: Insufficient documentation

## 2022-05-03 DIAGNOSIS — R29898 Other symptoms and signs involving the musculoskeletal system: Secondary | ICD-10-CM | POA: Diagnosis not present

## 2022-05-03 DIAGNOSIS — Z7984 Long term (current) use of oral hypoglycemic drugs: Secondary | ICD-10-CM | POA: Insufficient documentation

## 2022-05-03 DIAGNOSIS — U071 COVID-19: Secondary | ICD-10-CM | POA: Diagnosis not present

## 2022-05-03 DIAGNOSIS — G319 Degenerative disease of nervous system, unspecified: Secondary | ICD-10-CM | POA: Diagnosis not present

## 2022-05-03 DIAGNOSIS — Z794 Long term (current) use of insulin: Secondary | ICD-10-CM | POA: Insufficient documentation

## 2022-05-03 DIAGNOSIS — R4182 Altered mental status, unspecified: Secondary | ICD-10-CM | POA: Diagnosis not present

## 2022-05-03 DIAGNOSIS — R519 Headache, unspecified: Secondary | ICD-10-CM | POA: Diagnosis not present

## 2022-05-03 DIAGNOSIS — E162 Hypoglycemia, unspecified: Secondary | ICD-10-CM

## 2022-05-03 DIAGNOSIS — J3489 Other specified disorders of nose and nasal sinuses: Secondary | ICD-10-CM | POA: Diagnosis not present

## 2022-05-03 DIAGNOSIS — R5383 Other fatigue: Secondary | ICD-10-CM | POA: Diagnosis not present

## 2022-05-03 DIAGNOSIS — R200 Anesthesia of skin: Secondary | ICD-10-CM | POA: Diagnosis not present

## 2022-05-03 DIAGNOSIS — R202 Paresthesia of skin: Secondary | ICD-10-CM | POA: Diagnosis not present

## 2022-05-03 DIAGNOSIS — R479 Unspecified speech disturbances: Secondary | ICD-10-CM

## 2022-05-03 LAB — URINALYSIS, ROUTINE W REFLEX MICROSCOPIC
Bilirubin Urine: NEGATIVE
Glucose, UA: NEGATIVE mg/dL
Hgb urine dipstick: NEGATIVE
Ketones, ur: NEGATIVE mg/dL
Leukocytes,Ua: NEGATIVE
Nitrite: NEGATIVE
Protein, ur: NEGATIVE mg/dL
Specific Gravity, Urine: 1.011 (ref 1.005–1.030)
pH: 5 (ref 5.0–8.0)

## 2022-05-03 LAB — COMPREHENSIVE METABOLIC PANEL
ALT: 13 U/L (ref 0–44)
AST: 17 U/L (ref 15–41)
Albumin: 3.4 g/dL — ABNORMAL LOW (ref 3.5–5.0)
Alkaline Phosphatase: 65 U/L (ref 38–126)
Anion gap: 8 (ref 5–15)
BUN: 26 mg/dL — ABNORMAL HIGH (ref 8–23)
CO2: 22 mmol/L (ref 22–32)
Calcium: 8.2 mg/dL — ABNORMAL LOW (ref 8.9–10.3)
Chloride: 103 mmol/L (ref 98–111)
Creatinine, Ser: 1.33 mg/dL — ABNORMAL HIGH (ref 0.44–1.00)
GFR, Estimated: 41 mL/min — ABNORMAL LOW (ref >60)
Glucose, Bld: 272 mg/dL — ABNORMAL HIGH (ref 70–99)
Potassium: 4.2 mmol/L (ref 3.5–5.1)
Sodium: 133 mmol/L — ABNORMAL LOW (ref 135–145)
Total Bilirubin: 0.4 mg/dL (ref 0.3–1.2)
Total Protein: 6.9 g/dL (ref 6.5–8.1)

## 2022-05-03 LAB — CBC WITH DIFFERENTIAL/PLATELET
Abs Immature Granulocytes: 0.02 10*3/uL (ref 0.00–0.07)
Basophils Absolute: 0 10*3/uL (ref 0.0–0.1)
Basophils Relative: 0 %
Eosinophils Absolute: 0 10*3/uL (ref 0.0–0.5)
Eosinophils Relative: 0 %
HCT: 33.8 % — ABNORMAL LOW (ref 36.0–46.0)
Hemoglobin: 10.7 g/dL — ABNORMAL LOW (ref 12.0–15.0)
Immature Granulocytes: 0 %
Lymphocytes Relative: 17 %
Lymphs Abs: 1 10*3/uL (ref 0.7–4.0)
MCH: 30.3 pg (ref 26.0–34.0)
MCHC: 31.7 g/dL (ref 30.0–36.0)
MCV: 95.8 fL (ref 80.0–100.0)
Monocytes Absolute: 0.6 10*3/uL (ref 0.1–1.0)
Monocytes Relative: 10 %
Neutro Abs: 4 10*3/uL (ref 1.7–7.7)
Neutrophils Relative %: 73 %
Platelets: 243 10*3/uL (ref 150–400)
RBC: 3.53 MIL/uL — ABNORMAL LOW (ref 3.87–5.11)
RDW: 14 % (ref 11.5–15.5)
WBC: 5.6 10*3/uL (ref 4.0–10.5)
nRBC: 0 % (ref 0.0–0.2)

## 2022-05-03 LAB — RESP PANEL BY RT-PCR (RSV, FLU A&B, COVID)  RVPGX2
Influenza A by PCR: NEGATIVE
Influenza B by PCR: NEGATIVE
Resp Syncytial Virus by PCR: NEGATIVE
SARS Coronavirus 2 by RT PCR: POSITIVE — AB

## 2022-05-03 LAB — CBG MONITORING, ED: Glucose-Capillary: 248 mg/dL — ABNORMAL HIGH (ref 70–99)

## 2022-05-03 LAB — TSH: TSH: 0.368 u[IU]/mL (ref 0.350–4.500)

## 2022-05-03 LAB — LACTIC ACID, PLASMA
Lactic Acid, Venous: 1.7 mmol/L (ref 0.5–1.9)
Lactic Acid, Venous: 1.3 mmol/L (ref 0.5–1.9)

## 2022-05-03 MED ORDER — IOHEXOL 350 MG/ML SOLN
75.0000 mL | Freq: Once | INTRAVENOUS | Status: AC | PRN
  Administered 2022-05-03: 75 mL via INTRAVENOUS

## 2022-05-03 MED ORDER — SODIUM CHLORIDE 0.9 % IV BOLUS
500.0000 mL | Freq: Once | INTRAVENOUS | Status: AC
  Administered 2022-05-03: 500 mL via INTRAVENOUS

## 2022-05-03 NOTE — ED Triage Notes (Signed)
Pt BIB [facility] from home with c/o hypoglycemia CBG 49. Left arm weakness, unable to hold up arm, stated the arm was "numb".Equal grip strength bilaterally. Last CBG 107. Takes 55units of Lantus at bedtime. No Hx of stroke. Was given D10 by EMS, sx resolved. LKN 2300.

## 2022-05-03 NOTE — ED Provider Notes (Signed)
82 yo female presenting with dyarthria, left sided weakness, glucose 48 at home, all resolved after coca cola and d10 Patient did not eat breakfast today because she feels badly; took insulin last night Fell and hit head 1 week ago  She is covid positive  Physical Exam  BP (!) 194/80   Pulse 76   Temp 98 F (36.7 C)   Resp 17   Ht 5\' 2"  (1.575 m)   Wt 113.4 kg   SpO2 100%   BMI 45.73 kg/m   Physical Exam  Procedures  Procedures  ED Course / MDM    Medical Decision Making Amount and/or Complexity of Data Reviewed Labs: ordered. Radiology: ordered.   I reviewed the patient's imaging and there were no emergent findings on her neuroimaging.  I reassessed the patient and she feels completely asymptomatic now, no longer has weakness or numbness in her left leg.  I suspect this was transient related to her hypoglycemia.  We discussed her COVID diagnosis, it is possible she has had mild COVID-like symptoms for the past 4 to 5 days with a sore throat and a mild cough.  But I do not see an indication for hospitalization at this time.  She is not hypoxic.  Her husband is here to take her home.       Wyvonnia Dusky, MD 05/03/22 2002

## 2022-05-03 NOTE — ED Provider Notes (Signed)
Melissa Zhang Provider Note   CSN: [id number] Arrival date & time: 05/03/22  1249     History  Chief Complaint  Patient presents with   Hypoglycemia    Melissa Zhang is a 82 y.o. female.  The history is provided by the patient, medical records and the EMS personnel. No language interpreter was used.  Hypoglycemia Initial blood sugar:  49 Severity:  Severe Onset quality:  Gradual Timing:  Unable to specify Progression:  Improving Chronicity:  New Diabetic status:  Controlled with insulin Context: decreased oral intake   Relieved by:  Eating and IV glucose Ineffective treatments:  None tried Associated symptoms: speech difficulty and weakness   Associated symptoms: no altered mental status, no seizures, no shortness of breath and no vomiting        Home Medications Prior to Admission medications   Medication Sig Start Date End Date Taking? Authorizing Provider  acetaminophen (TYLENOL) 650 MG CR tablet Take 1,300 mg by mouth every 8 (eight) hours as needed for pain.    [provider]  bisoprolol (ZEBETA) 5 MG tablet Take 1 tablet (5 mg total) by mouth daily. 04/19/22   Minus Breeding, MD  cyclobenzaprine (FLEXERIL) 10 MG tablet Take 1 tablet (10 mg total) by mouth 2 (two) times daily as needed for muscle spasms. 04/28/22   Wilnette Kales, PA  Dulaglutide (TRULICITY) [id number] SOPN Inject 0.75 mg as directed every Tuesday. Patient not taking: Reported on 11/21/2021 04/07/20   [provider]  DULoxetine (CYMBALTA) 60 MG capsule Take 60 mg by mouth daily. 03/05/13   [provider]  furosemide (LASIX) 20 MG tablet Take 2 tablets (40 mg total) by mouth daily. 03/11/22   Minus Breeding, MD  gabapentin (NEURONTIN) 300 MG capsule Take 300 mg by mouth at bedtime. 04/11/13   [provider]  glimepiride (AMARYL) 4 MG tablet Take 4 mg by mouth 2 (two) times daily. 02/26/13   [provider]   hydrALAZINE (APRESOLINE) 25 MG tablet Take 1 tablet (25 mg total) by mouth 3 (three) times daily. 03/11/22   Minus Breeding, MD  isosorbide dinitrate (ISORDIL) 20 MG tablet Take 1 tablet (20 mg total) by mouth 3 (three) times daily. 04/19/22   Minus Breeding, MD  LANTUS SOLOSTAR 100 UNIT/ML Solostar Pen Inject 55 Units into the skin at bedtime. 03/01/13   [provider]  levothyroxine (SYNTHROID) 75 MCG tablet Take 75 mcg by mouth daily before breakfast. 10/22/21   [provider]  lovastatin (MEVACOR) 40 MG tablet Take 40 mg by mouth at bedtime. 03/26/13   [provider]  metFORMIN (GLUCOPHAGE) 1000 MG tablet Take 1,000 mg by mouth 2 (two) times daily with a meal. 10/19/21   [provider]      Allergies    Lisinopril, Actos [pioglitazone], Jardiance [empagliflozin], Losartan, and Ciprofloxacin    Review of Systems   Review of Systems  Constitutional:  Positive for fatigue. Negative for chills and fever.  HENT:  Negative for congestion.   Eyes:  Negative for visual disturbance.  Respiratory:  Negative for cough, chest tightness, shortness of breath and wheezing.   Cardiovascular:  Negative for chest pain.  Gastrointestinal:  Negative for abdominal pain, constipation, diarrhea, nausea, rectal pain and vomiting.  Genitourinary:  Negative for dysuria and frequency.  Musculoskeletal:  Positive for neck pain. Negative for back pain and neck stiffness.  Skin:  Negative for rash and wound.  Neurological:  Positive for  speech difficulty, weakness and headaches. Negative for seizures and light-headedness.  All other systems reviewed and are negative.   Physical Exam Updated Vital Signs Ht 5\' 2"  (1.575 m)   Wt 113.4 kg   BMI 45.73 kg/m  Physical Exam Vitals and nursing note reviewed.  Constitutional:      General: She is not in acute distress.    Appearance: She is well-developed. She is not ill-appearing, toxic-appearing or diaphoretic.  HENT:     Head:  Normocephalic and atraumatic.     Nose: Nose normal.     Mouth/Throat:     Mouth: Mucous membranes are moist.     Pharynx: No oropharyngeal exudate or posterior oropharyngeal erythema.  Eyes:     Extraocular Movements: Extraocular movements intact.     Conjunctiva/sclera: Conjunctivae normal.     Pupils: Pupils are equal, round, and reactive to light.  Cardiovascular:     Rate and Rhythm: Normal rate and regular rhythm.     Heart sounds: No murmur heard. Pulmonary:     Effort: Pulmonary effort is normal. No respiratory distress.     Breath sounds: Normal breath sounds. No wheezing, rhonchi or rales.  Chest:     Chest wall: No tenderness.  Abdominal:     General: Abdomen is flat.     Palpations: Abdomen is soft.     Tenderness: There is no abdominal tenderness. There is no guarding or rebound.  Musculoskeletal:        General: No swelling or tenderness.     Cervical back: Neck supple. No tenderness.     Right lower leg: No edema.     Left lower leg: No edema.  Skin:    General: Skin is warm and dry.     Capillary Refill: Capillary refill takes less than 2 seconds.     Findings: No erythema.  Neurological:     Mental Status: She is alert.     Sensory: Sensory deficit present.     Motor: No weakness.     Coordination: Coordination normal.     Comments: Patient has numbness in left leg compared to right had symmetric strength otherwise for me.  No weakness.  Symmetric smile.  Clear speech.  Pupils are symmetric and reactive normal extract movements.  No tenderness on my exam in the head or neck.  No crepitance.  Finger-nose-finger testing intact.  Psychiatric:        Mood and Affect: Mood normal.     ED Results / Procedures / Treatments   Labs (all labs ordered are listed, but only abnormal results are displayed) Labs Reviewed  RESP PANEL BY RT-PCR (RSV, FLU A&B, COVID)  RVPGX2 - Abnormal; Notable for the following components:      Result Value   SARS Coronavirus 2 by RT  PCR POSITIVE (*)    All other components within normal limits  CBC WITH DIFFERENTIAL/PLATELET - Abnormal; Notable for the following components:   RBC 3.53 (*)    Hemoglobin 10.7 (*)    HCT 33.8 (*)    All other components within normal limits  COMPREHENSIVE METABOLIC PANEL - Abnormal; Notable for the following components:   Sodium 133 (*)    Glucose, Bld 272 (*)    BUN 26 (*)    Creatinine, Ser 1.33 (*)    Calcium 8.2 (*)    Albumin 3.4 (*)    GFR, Estimated 41 (*)    All other components within normal limits  CBG MONITORING, ED - Abnormal; Notable  for the following components:   Glucose-Capillary 248 (*)    All other components within normal limits  LACTIC ACID, PLASMA  URINALYSIS, ROUTINE W REFLEX MICROSCOPIC  TSH  LACTIC ACID, PLASMA    EKG EKG Interpretation  Date/Time:  Friday May 03 2022 13:18:57 EDT Ventricular Rate:  82 PR Interval:  166 QRS Duration: 131 QT Interval:  435 QTC Calculation: 509 R Axis:   -51 Text Interpretation: Sinus rhythm Left bundle branch block when comapred to prior, similar appearance. No STEMI Confirmed by Antony Blackbird [phone]) on 05/03/2022 3:37:14 PM  Radiology DG Chest Portable 1 View  Result Date: 05/03/2022 CLINICAL DATA:  Fatigue. Altered mental status. Evaluate for occult infection. EXAM: PORTABLE CHEST 1 VIEW COMPARISON:  Chest radiographs 10/18/2004 FINDINGS: Cardiac silhouette is mildly enlarged, new from remote 2006 prior. Moderate calcifications within the aortic arch. Mediastinal contours are within normal limits for supine AP technique. Mild bilateral lower lung interstitial thickening. No focal airspace opacity. No pleural effusion pneumothorax. Moderate multilevel disc space narrowing and endplate osteophytes of the thoracic spine. Mild dextrocurvature of the lower thoracic spine is similar to prior. IMPRESSION: 1. Mild bilateral lower lung interstitial thickening is favored to be chronic. Mild interstitial pulmonary edema or  atypical infection can have a similar appearance. No focal airspace opacity to indicate focal pneumonia. 2. Mild cardiomegaly. Electronically Signed   By: Yvonne Kendall M.D.   On: 05/03/2022 14:06    Procedures Procedures    Medications Ordered in ED Medications  sodium chloride 0.9 % bolus 500 mL (0 mLs Intravenous Stopped 05/03/22 1600)    ED Course/ Medical Decision Making/ A&P                             Medical Decision Making Amount and/or Complexity of Data Reviewed Labs: ordered. Radiology: ordered.    TALLYN FITTRO is a 82 y.o. female with a past medical history significant for hypertension, diabetes, sleep apnea, hyperlipidemia, pulmonary hypertension, and documentation of previous diastolic dysfunction who presents for hypoglycemia with transient neurologic deficits as well as head injury, headache, and fatigue.  According to patient, on Monday she fell and hit her head and has been having head and neck pain ever since.  She reports that she has been feeling ill and did not take any insulin this morning nor did she eat breakfast but noted she was having left leg and left arm weakness, left leg numbness, and difficulty speaking.  She called EMS who saw her and found her glucose to be in the 40s.  She subsequently was given some Coca-Cola and D10 and her glucose improved as did her symptoms.  She reports her dysarthria improved and has resolved.  She reports she is picking at her baseline.  She reports she is still having some headache primarily left side of her head.  She says her left arm weakness has resolved and her left leg does not feel weak now either.  She reports no fevers, chills, congestion, cough, nausea, or vomiting.  Denies any constipation, diarrhea, or urinary changes.  She felt she has just been tired all week and feeling ill since that fall.  On exam, lungs were clear.  Chest nontender.  Abdomen nontender.  Patient did have numbness in her left leg compared to  right which is new she reports.  She did not have any facial droop and has symmetric smile.  Clear speech for me.  No dysarthria or  aphasia.  Pupils are symmetric and reactive with normal extract movements.  No murmur on my exam.  Patient had intact finger-nose-finger testing bilaterally.  Only neurologic deficit appeared to be some mild left leg numbness compared to normal.  Given the patient's improvement in the neurologic deficits with improvement in the hypoglycemia I suspect that that was the cause of a neurologic deficits however we will get CTA head and neck as well as MRI as her symptoms began after head trauma and she has had head and neck pain since.  Will make sure she does not have a vertebral artery dissection or intracranial bleed.  Will get screening labs as well to look for abnormalities causing this fatigue and malaise.  She did not have evidence of nuchal rigidity and have low suspicion for meningitis at this time.  Anticipate reassessment after workup.  Will continue to monitor glucoses.  As her speech symptoms and weakness resolved, do not feel she meets LVO criteria.  Last normal was 11 PM last night.  I suspect her not eating anything this morning led to his hypoglycemia after taking her nighttime insulin.  She did not take insulin this morning.  Care transferred to oncoming team to await for results of CT and MRI and reassessment.  If her symptoms completely resolved this is only due to the transient hypoglycemia, patient may be stable for discharge home however due to the recent head injury and headache and the persistent numbness, will await for imaging.   Also, her COVID test came back positive, this may also contribute to her symptoms.  Will await reassessment        Final Clinical Impression(s) / ED Diagnoses Final diagnoses:  Hypoglycemia  Transient neurologic deficit  Left leg numbness  Transient speech disturbance     Clinical Impression: 1. Hypoglycemia    2. Transient neurologic deficit   3. Left leg numbness   4. Transient speech disturbance     Disposition: Care transferred to oncoming team to wait for workup to complete.  This note was prepared with assistance of Systems analyst. Occasional wrong-word or sound-a-like substitutions may have occurred due to the inherent limitations of voice recognition software.      Kasidy Gianino, Gwenyth Allegra, MD 05/03/22 [phone]

## 2022-05-03 NOTE — Discharge Instructions (Addendum)
Your COVID test was positive today.  That means that you may currently have COVID, or you may have had it recently in the past 2 months.  The rest of your blood tests including your brain scan did not show any emergencies or signs of strokes.  Please be aware that your blood sugar was low this morning and this can cause strokelike symptoms.  Make sure that if you ever begin to feel sweaty, hot, dizzy, or have stroke symptoms, you should try to take sugar as soon as possible.  If you have any of these concerns please call 911 immediately.

## 2022-07-09 DIAGNOSIS — I272 Pulmonary hypertension, unspecified: Secondary | ICD-10-CM | POA: Diagnosis not present

## 2022-07-09 DIAGNOSIS — D509 Iron deficiency anemia, unspecified: Secondary | ICD-10-CM | POA: Diagnosis not present

## 2022-07-09 DIAGNOSIS — I5032 Chronic diastolic (congestive) heart failure: Secondary | ICD-10-CM | POA: Diagnosis not present

## 2022-07-09 DIAGNOSIS — I25118 Atherosclerotic heart disease of native coronary artery with other forms of angina pectoris: Secondary | ICD-10-CM | POA: Diagnosis not present

## 2022-07-09 DIAGNOSIS — E1140 Type 2 diabetes mellitus with diabetic neuropathy, unspecified: Secondary | ICD-10-CM | POA: Diagnosis not present

## 2022-07-09 DIAGNOSIS — N183 Chronic kidney disease, stage 3 unspecified: Secondary | ICD-10-CM | POA: Diagnosis not present

## 2022-07-09 DIAGNOSIS — I130 Hypertensive heart and chronic kidney disease with heart failure and stage 1 through stage 4 chronic kidney disease, or unspecified chronic kidney disease: Secondary | ICD-10-CM | POA: Diagnosis not present

## 2022-07-09 DIAGNOSIS — E1122 Type 2 diabetes mellitus with diabetic chronic kidney disease: Secondary | ICD-10-CM | POA: Diagnosis not present

## 2022-07-09 DIAGNOSIS — E039 Hypothyroidism, unspecified: Secondary | ICD-10-CM | POA: Diagnosis not present

## 2022-07-09 DIAGNOSIS — E785 Hyperlipidemia, unspecified: Secondary | ICD-10-CM | POA: Diagnosis not present

## 2022-07-11 ENCOUNTER — Other Ambulatory Visit: Payer: Self-pay | Admitting: Family Medicine

## 2022-07-11 DIAGNOSIS — Z1231 Encounter for screening mammogram for malignant neoplasm of breast: Secondary | ICD-10-CM

## 2022-07-25 DIAGNOSIS — Z1231 Encounter for screening mammogram for malignant neoplasm of breast: Secondary | ICD-10-CM | POA: Diagnosis not present

## 2022-07-31 DIAGNOSIS — R921 Mammographic calcification found on diagnostic imaging of breast: Secondary | ICD-10-CM | POA: Diagnosis not present

## 2022-07-31 DIAGNOSIS — R928 Other abnormal and inconclusive findings on diagnostic imaging of breast: Secondary | ICD-10-CM | POA: Diagnosis not present

## 2022-08-02 DIAGNOSIS — R1310 Dysphagia, unspecified: Secondary | ICD-10-CM | POA: Diagnosis not present

## 2022-08-02 DIAGNOSIS — Z86010 Personal history of colonic polyps: Secondary | ICD-10-CM | POA: Diagnosis not present

## 2022-08-05 ENCOUNTER — Other Ambulatory Visit (HOSPITAL_COMMUNITY): Payer: Self-pay | Admitting: Family Medicine

## 2022-08-05 DIAGNOSIS — R1310 Dysphagia, unspecified: Secondary | ICD-10-CM

## 2022-08-13 ENCOUNTER — Ambulatory Visit (HOSPITAL_COMMUNITY)
Admission: RE | Admit: 2022-08-13 | Discharge: 2022-08-13 | Disposition: A | Discharge status: Home / Self Care | Payer: Medicare PPO | Source: Ambulatory Visit | Attending: Family Medicine | Admitting: Family Medicine

## 2022-08-13 DIAGNOSIS — R1310 Dysphagia, unspecified: Secondary | ICD-10-CM | POA: Insufficient documentation

## 2022-08-13 DIAGNOSIS — K219 Gastro-esophageal reflux disease without esophagitis: Secondary | ICD-10-CM | POA: Diagnosis not present

## 2022-08-13 DIAGNOSIS — K449 Diaphragmatic hernia without obstruction or gangrene: Secondary | ICD-10-CM | POA: Diagnosis not present

## 2022-08-14 ENCOUNTER — Telehealth: Payer: Self-pay | Admitting: Cardiology

## 2022-08-14 MED ORDER — FUROSEMIDE 20 MG PO TABS: 40.0000 mg | ORAL_TABLET | Freq: Every day | ORAL | 3 refills | Status: DC

## 2022-08-14 NOTE — Telephone Encounter (Signed)
*  STAT* If patient is at the pharmacy, call can be transferred to refill team.   1. Which medications need to be refilled? (please list name of each medication and dose if known) furosemide (LASIX) 20 MG tablet  2. Which pharmacy/location (including street and city if local pharmacy) is medication to be sent to? [facility] - [place], [place] - [address]  3. Do they need a 30 day or 90 day supply?  90 day supply

## 2022-09-05 DIAGNOSIS — R1310 Dysphagia, unspecified: Secondary | ICD-10-CM | POA: Diagnosis not present

## 2022-09-05 DIAGNOSIS — K2970 Gastritis, unspecified, without bleeding: Secondary | ICD-10-CM | POA: Diagnosis not present

## 2022-09-05 DIAGNOSIS — Z09 Encounter for follow-up examination after completed treatment for conditions other than malignant neoplasm: Secondary | ICD-10-CM | POA: Diagnosis not present

## 2022-09-05 DIAGNOSIS — D122 Benign neoplasm of ascending colon: Secondary | ICD-10-CM | POA: Diagnosis not present

## 2022-09-05 DIAGNOSIS — D124 Benign neoplasm of descending colon: Secondary | ICD-10-CM | POA: Diagnosis not present

## 2022-09-05 DIAGNOSIS — K5730 Diverticulosis of large intestine without perforation or abscess without bleeding: Secondary | ICD-10-CM | POA: Diagnosis not present

## 2022-09-05 DIAGNOSIS — D125 Benign neoplasm of sigmoid colon: Secondary | ICD-10-CM | POA: Diagnosis not present

## 2022-09-05 DIAGNOSIS — Z86010 Personal history of colonic polyps: Secondary | ICD-10-CM | POA: Diagnosis not present

## 2022-09-05 DIAGNOSIS — K648 Other hemorrhoids: Secondary | ICD-10-CM | POA: Diagnosis not present

## 2022-09-09 DIAGNOSIS — D122 Benign neoplasm of ascending colon: Secondary | ICD-10-CM | POA: Diagnosis not present

## 2022-09-09 DIAGNOSIS — K2970 Gastritis, unspecified, without bleeding: Secondary | ICD-10-CM | POA: Diagnosis not present

## 2022-09-09 DIAGNOSIS — D124 Benign neoplasm of descending colon: Secondary | ICD-10-CM | POA: Diagnosis not present

## 2022-09-09 DIAGNOSIS — D125 Benign neoplasm of sigmoid colon: Secondary | ICD-10-CM | POA: Diagnosis not present

## 2022-09-24 ENCOUNTER — Other Ambulatory Visit: Payer: Self-pay

## 2022-09-24 MED ORDER — HYDRALAZINE HCL 25 MG PO TABS: 25.0000 mg | ORAL_TABLET | Freq: Three times a day (TID) | ORAL | 1 refills | Status: DC

## 2022-09-28 DIAGNOSIS — L03115 Cellulitis of right lower limb: Secondary | ICD-10-CM | POA: Diagnosis not present

## 2022-09-28 DIAGNOSIS — L03116 Cellulitis of left lower limb: Secondary | ICD-10-CM | POA: Diagnosis not present

## 2022-10-08 DIAGNOSIS — R600 Localized edema: Secondary | ICD-10-CM | POA: Diagnosis not present

## 2022-10-10 ENCOUNTER — Ambulatory Visit (HOSPITAL_COMMUNITY)
Admission: RE | Admit: 2022-10-10 | Discharge: 2022-10-10 | Disposition: A | Discharge status: Home / Self Care | Payer: Medicare PPO | Source: Ambulatory Visit | Attending: Vascular Surgery | Admitting: Vascular Surgery

## 2022-10-10 ENCOUNTER — Other Ambulatory Visit (HOSPITAL_COMMUNITY): Payer: Self-pay | Admitting: Family Medicine

## 2022-10-10 DIAGNOSIS — I872 Venous insufficiency (chronic) (peripheral): Secondary | ICD-10-CM

## 2022-10-22 ENCOUNTER — Other Ambulatory Visit: Payer: Self-pay

## 2022-10-22 MED ORDER — BISOPROLOL FUMARATE 5 MG PO TABS: 5.0000 mg | ORAL_TABLET | Freq: Every day | ORAL | 0 refills | Status: DC

## 2022-10-22 MED ORDER — HYDRALAZINE HCL 25 MG PO TABS: 25.0000 mg | ORAL_TABLET | Freq: Three times a day (TID) | ORAL | 0 refills | Status: DC

## 2022-10-22 MED ORDER — FUROSEMIDE 20 MG PO TABS: 40.0000 mg | ORAL_TABLET | Freq: Every day | ORAL | 0 refills | Status: DC

## 2022-11-04 DIAGNOSIS — R600 Localized edema: Secondary | ICD-10-CM | POA: Diagnosis not present

## 2022-11-04 DIAGNOSIS — L03116 Cellulitis of left lower limb: Secondary | ICD-10-CM | POA: Diagnosis not present

## 2022-11-04 DIAGNOSIS — I890 Lymphedema, not elsewhere classified: Secondary | ICD-10-CM | POA: Diagnosis not present

## 2022-11-04 DIAGNOSIS — L03115 Cellulitis of right lower limb: Secondary | ICD-10-CM | POA: Diagnosis not present

## 2022-11-14 ENCOUNTER — Telehealth: Payer: Self-pay | Admitting: Cardiology

## 2022-11-14 DIAGNOSIS — R600 Localized edema: Secondary | ICD-10-CM | POA: Diagnosis not present

## 2022-11-14 DIAGNOSIS — L03115 Cellulitis of right lower limb: Secondary | ICD-10-CM | POA: Diagnosis not present

## 2022-11-14 DIAGNOSIS — I890 Lymphedema, not elsewhere classified: Secondary | ICD-10-CM | POA: Diagnosis not present

## 2022-11-14 DIAGNOSIS — L03116 Cellulitis of left lower limb: Secondary | ICD-10-CM | POA: Diagnosis not present

## 2022-11-14 NOTE — Telephone Encounter (Signed)
Melissa Zhang is aware per provider she can do the therapy

## 2022-11-14 NOTE — Telephone Encounter (Signed)
Victorino Dike from [facility] called stated patient was referred to them by her PCP for her leg edema. She would like to know if patient can participate in a Circaid wrap and manual lymphatic drainage. She states with manual drainage it contraindicates with patients who have active CHF, uncontrolled hypertension and severe cardiac problems. Or does patient needs to wait and see you first?

## 2022-11-14 NOTE — Telephone Encounter (Signed)
Melissa Zhang from [facility] is calling to discuss forms of therapy and to get advice as to what the patient can and can not do during physical therapy. Please advise.

## 2022-11-18 DIAGNOSIS — R600 Localized edema: Secondary | ICD-10-CM | POA: Diagnosis not present

## 2022-11-18 DIAGNOSIS — I890 Lymphedema, not elsewhere classified: Secondary | ICD-10-CM | POA: Diagnosis not present

## 2022-11-18 DIAGNOSIS — L03116 Cellulitis of left lower limb: Secondary | ICD-10-CM | POA: Diagnosis not present

## 2022-11-18 DIAGNOSIS — L03115 Cellulitis of right lower limb: Secondary | ICD-10-CM | POA: Diagnosis not present

## 2022-11-21 DIAGNOSIS — R600 Localized edema: Secondary | ICD-10-CM | POA: Diagnosis not present

## 2022-11-21 DIAGNOSIS — L03116 Cellulitis of left lower limb: Secondary | ICD-10-CM | POA: Diagnosis not present

## 2022-11-21 DIAGNOSIS — L03115 Cellulitis of right lower limb: Secondary | ICD-10-CM | POA: Diagnosis not present

## 2022-11-21 DIAGNOSIS — I890 Lymphedema, not elsewhere classified: Secondary | ICD-10-CM | POA: Diagnosis not present

## 2022-11-26 NOTE — Progress Notes (Unsigned)
Cardiology Office Note:   Date:  11/27/2022  ID:  Melissa Zhang, DOB December 24, 1940, MRN [id number] PCP: Henrine Screws, MD  [facility] Providers Cardiologist:  Rollene Rotunda, MD {  History of Present Illness:   Melissa Zhang is a 82 y.o. female who presents for follow up of valvular heart disease.  Echo in 2018 demonstrated an EF of 50 - 55% with moderate MR and a PFO vs. ASD.     I saw her for the first time in December 2021.  Follow up echo demonstrated that the that her EF was down to 45%.  Ejection fraction is slightly reduced compared to previous.   MR was moderate on TEE.   I sent her for a TEE and MR was moderately severe.    I sent her for a cardiac cath with non obstructive CAD and severe pulmonary HTN.     I sent her for another echo in October 2023.  Her pulmonary pressures was  increased compared to previous.  This appeared to be  multifactorial with her sleep apnea and morbid obesity.     Since I last saw her she has not had any new shortness of breath, PND or orthopnea.  She denies any palpitations, presyncope or syncope.  She has had no chest pressure, neck or arm discomfort.  She has had increased lower extremity right greater than left swelling and being managed for lymphedema.  She did have lower extremity Doppler and had no evidence of DVT.  There was some venous insufficiency.  I reviewed these records for this visit.  ROS: As stated in the HPI and negative for all other systems.  Studies Reviewed:    EKG:   NA    Risk Assessment/Calculations:              Physical Exam:   VS:  BP 132/68   Pulse (!) 18   Ht 5' 2.5" (1.588 m)   Wt 248 lb (112.5 kg)   BMI 44.64 kg/m    Wt Readings from Last 3 Encounters:  11/27/22 248 lb (112.5 kg)  05/03/22 250 lb (113.4 kg)  04/28/22 250 lb (113.4 kg)     GEN: Well nourished, well developed in no acute distress NECK: Positive JVD; No carotid bruits CARDIAC: RRR, no murmurs, rubs, gallops RESPIRATORY:  Clear  to auscultation without rales, wheezing or rhonchi  ABDOMEN: Soft, non-tender, non-distended EXTREMITIES: Mild to moderate bilateral lower extremity edema; No deformity   ASSESSMENT AND PLAN:   MR: She would probably only want conservative therapy of this and we talked about it.  However, she will consent to letting me do another transthoracic echo further monitor this.  Pulmonary HTN: She has had right heart cath as mentioned elsewhere.  She has probably multifactorial pulmonary hypertension that I do not think would be amenable to vasoactive drugs.  Her [place] units were only 3.24 previously.  Per her request and managing her conservatively.  Her pressures will be assessed as above.  Cardiomyopathy:  Her EF was 50 to 55% in the past.  This had improved.  She had not tolerated SGLT2 inhibitors or GLP-1 receptor agonist in the past.  She is not having new symptoms and so she will continue current meds.    Morbid obesity: She has not wanted enrollment in our weight loss clinic because station.  We talked about this previously.  Chronic diastolic dysfunction: We talked about salt and fluid restriction.  She seems to be relatively euvolemic.  Small secundum ASD:   This was found incidentally.  She is not having any symptoms related to this.  No change in therapy.   Sleep apnea:    She had an abnormal home sleep study but declined CPAP.  Essential HTN:    The blood pressure is at target.  No change in therapy.       Follow up with me in one year.   Signed, Rollene Rotunda, MD

## 2022-11-27 ENCOUNTER — Ambulatory Visit: Payer: Medicare PPO | Admitting: Cardiology

## 2022-11-27 ENCOUNTER — Encounter: Payer: Self-pay | Admitting: Cardiology

## 2022-11-27 VITALS — BP 132/68 | HR 18 | Ht 62.5 in | Wt 248.0 lb

## 2022-11-27 DIAGNOSIS — I272 Pulmonary hypertension, unspecified: Secondary | ICD-10-CM

## 2022-11-27 DIAGNOSIS — Q2111 Secundum atrial septal defect: Secondary | ICD-10-CM

## 2022-11-27 DIAGNOSIS — I340 Nonrheumatic mitral (valve) insufficiency: Secondary | ICD-10-CM | POA: Diagnosis not present

## 2022-11-27 DIAGNOSIS — I10 Essential (primary) hypertension: Secondary | ICD-10-CM | POA: Diagnosis not present

## 2022-11-27 DIAGNOSIS — I5032 Chronic diastolic (congestive) heart failure: Secondary | ICD-10-CM

## 2022-11-27 NOTE — Patient Instructions (Signed)
Medication Instructions:  The current medical regimen is effective;  continue present plan and medications.  *If you need a refill on your cardiac medications before your next appointment, please call your pharmacy*  Testing/Procedures: Your physician has requested that you have an echocardiogram. Echocardiography is a painless test that uses sound waves to create images of your heart. It provides your doctor with information about the size and shape of your heart and how well your heart's chambers and valves are working. This procedure takes approximately one hour. There are no restrictions for this procedure. Please do NOT wear cologne, perfume, aftershave, or lotions (deodorant is allowed). Please arrive 15 minutes prior to your appointment time. This will be completed at the [facility] office and you will be contacted to be scheduled.   Follow-Up: At [facility], you and your health needs are our priority.  As part of our continuing mission to provide you with exceptional heart care, we have created designated Provider Care Teams.  These Care Teams include your primary Cardiologist (physician) and Advanced Practice Providers (APPs -  Physician Assistants and Nurse Practitioners) who all work together to provide you with the care you need, when you need it.  We recommend signing up for the patient portal called "MyChart".  Sign up information is provided on this After Visit Summary.  MyChart is used to connect with patients for Virtual Visits (Telemedicine).  Patients are able to view lab/test results, encounter notes, upcoming appointments, etc.  Non-urgent messages can be sent to your provider as well.   To learn more about what you can do with MyChart, go to [email or web address].    Your next appointment:   1 year(s)  Provider:   Rollene Rotunda, MD

## 2022-12-07 ENCOUNTER — Encounter (HOSPITAL_COMMUNITY): Payer: Self-pay | Admitting: Emergency Medicine

## 2022-12-07 ENCOUNTER — Other Ambulatory Visit: Payer: Self-pay

## 2022-12-07 ENCOUNTER — Emergency Department (HOSPITAL_COMMUNITY): Payer: Medicare PPO

## 2022-12-07 ENCOUNTER — Emergency Department (HOSPITAL_COMMUNITY)
Admission: EM | Admit: 2022-12-07 | Discharge: 2022-12-08 | Disposition: A | Discharge status: Home / Self Care | Payer: Medicare PPO | Attending: Emergency Medicine | Admitting: Emergency Medicine

## 2022-12-07 DIAGNOSIS — I517 Cardiomegaly: Secondary | ICD-10-CM | POA: Diagnosis not present

## 2022-12-07 DIAGNOSIS — Z79899 Other long term (current) drug therapy: Secondary | ICD-10-CM | POA: Diagnosis not present

## 2022-12-07 DIAGNOSIS — Z7984 Long term (current) use of oral hypoglycemic drugs: Secondary | ICD-10-CM | POA: Diagnosis not present

## 2022-12-07 DIAGNOSIS — R072 Precordial pain: Secondary | ICD-10-CM | POA: Diagnosis not present

## 2022-12-07 DIAGNOSIS — R0789 Other chest pain: Secondary | ICD-10-CM | POA: Insufficient documentation

## 2022-12-07 DIAGNOSIS — D71 Functional disorders of polymorphonuclear neutrophils: Secondary | ICD-10-CM | POA: Diagnosis not present

## 2022-12-07 DIAGNOSIS — Z794 Long term (current) use of insulin: Secondary | ICD-10-CM | POA: Diagnosis not present

## 2022-12-07 DIAGNOSIS — E119 Type 2 diabetes mellitus without complications: Secondary | ICD-10-CM | POA: Diagnosis not present

## 2022-12-07 DIAGNOSIS — R079 Chest pain, unspecified: Secondary | ICD-10-CM

## 2022-12-07 DIAGNOSIS — I700 Atherosclerosis of aorta: Secondary | ICD-10-CM | POA: Diagnosis not present

## 2022-12-07 DIAGNOSIS — I10 Essential (primary) hypertension: Secondary | ICD-10-CM | POA: Diagnosis not present

## 2022-12-07 LAB — BASIC METABOLIC PANEL
Anion gap: 7 (ref 5–15)
BUN: 25 mg/dL — ABNORMAL HIGH (ref 8–23)
CO2: 27 mmol/L (ref 22–32)
Calcium: 8.5 mg/dL — ABNORMAL LOW (ref 8.9–10.3)
Chloride: 100 mmol/L (ref 98–111)
Creatinine, Ser: 1.43 mg/dL — ABNORMAL HIGH (ref 0.44–1.00)
GFR, Estimated: 37 mL/min — ABNORMAL LOW (ref >60)
Glucose, Bld: 213 mg/dL — ABNORMAL HIGH (ref 70–99)
Potassium: 4.8 mmol/L (ref 3.5–5.1)
Sodium: 134 mmol/L — ABNORMAL LOW (ref 135–145)

## 2022-12-07 LAB — CBC
HCT: 31.4 % — ABNORMAL LOW (ref 36.0–46.0)
Hemoglobin: 9.7 g/dL — ABNORMAL LOW (ref 12.0–15.0)
MCH: 28.9 pg (ref 26.0–34.0)
MCHC: 30.9 g/dL (ref 30.0–36.0)
MCV: 93.5 fL (ref 80.0–100.0)
Platelets: 247 10*3/uL (ref 150–400)
RBC: 3.36 MIL/uL — ABNORMAL LOW (ref 3.87–5.11)
RDW: 15.5 % (ref 11.5–15.5)
WBC: 7.3 10*3/uL (ref 4.0–10.5)
nRBC: 0 % (ref 0.0–0.2)

## 2022-12-07 LAB — TROPONIN I (HIGH SENSITIVITY): Troponin I (High Sensitivity): 10 ng/L (ref <18)

## 2022-12-07 MED ORDER — FENTANYL CITRATE PF 50 MCG/ML IJ SOSY
50.0000 ug | PREFILLED_SYRINGE | Freq: Once | INTRAMUSCULAR | Status: AC
  Administered 2022-12-07: 50 ug via INTRAVENOUS
  Filled 2022-12-07: qty 1

## 2022-12-07 MED ORDER — IOHEXOL 350 MG/ML SOLN
80.0000 mL | Freq: Once | INTRAVENOUS | Status: AC | PRN
  Administered 2022-12-07: 80 mL via INTRAVENOUS

## 2022-12-07 NOTE — ED Notes (Signed)
Patient transported to CT 

## 2022-12-07 NOTE — Discharge Instructions (Signed)
Your chest pain does not appear to be related to a heart attack, nor does it appear to be related to a blood clot.  All of your other test have been reassuring, please take your fluid pills exactly as prescribed and follow-up with your doctor in the clinic within 3 days, ER for worsening symptoms

## 2022-12-07 NOTE — ED Provider Notes (Signed)
[facility] AT [facility] Provider Note   CSN: [id number] Arrival date & time: 12/07/22  1943     History  Chief Complaint  Patient presents with   Chest Pain    Melissa Zhang is a 82 y.o. female.   Chest Pain  This patient is an 82 year old female, she has a history of insulin requiring diabetes and hypertension, she follows with Dr. Antoine Poche of cardiology, she had a heart catheterization in April 2022, she has known valvular heart disease, she has known pulmonary hypertension and a cardiomyopathy with an ejection fraction of 45%.  She had patent coronary arteries with mild nonobstructive plaque in April 2022, she had an elevated right heart pressure and an elevated pulmonary capillary wedge pressure consistent with pulmonary hypertension  Last echocardiogram was October showing an ejection fraction of 50 to 55%, she had some elevated pulmonary artery pressures on that as well.  She states that she has had some chest pain throughout the day today, seems to be intermittent, no radiation to the neck or the arms, no shortness of breath or swelling of the legs more than normal.  No fevers or chills, no nausea vomiting or diarrhea    Home Medications Prior to Admission medications   Medication Sig Start Date End Date Taking? Authorizing Provider  acetaminophen (TYLENOL) 650 MG CR tablet Take 1,300 mg by mouth every 8 (eight) hours as needed for pain.    [provider]  bisoprolol (ZEBETA) 5 MG tablet Take 1 tablet (5 mg total) by mouth daily. 10/22/22   Rollene Rotunda, MD  cyclobenzaprine (FLEXERIL) 10 MG tablet Take 1 tablet (10 mg total) by mouth 2 (two) times daily as needed for muscle spasms. 04/28/22   Peter Garter, PA  DULoxetine (CYMBALTA) 60 MG capsule Take 60 mg by mouth daily. 03/05/13   [provider]  furosemide (LASIX) 20 MG tablet Take 2 tablets (40 mg total) by mouth daily. 10/22/22   Rollene Rotunda, MD   gabapentin (NEURONTIN) 300 MG capsule Take 300 mg by mouth at bedtime. 04/11/13   [provider]  glimepiride (AMARYL) 4 MG tablet Take 4 mg by mouth 2 (two) times daily. 02/26/13   [provider]  hydrALAZINE (APRESOLINE) 25 MG tablet Take 1 tablet (25 mg total) by mouth 3 (three) times daily. 10/22/22   Rollene Rotunda, MD  isosorbide dinitrate (ISORDIL) 20 MG tablet Take 1 tablet (20 mg total) by mouth 3 (three) times daily. 04/19/22   Rollene Rotunda, MD  LANTUS SOLOSTAR 100 UNIT/ML Solostar Pen Inject 55 Units into the skin at bedtime. 03/01/13   [provider]  levothyroxine (SYNTHROID) 75 MCG tablet Take 75 mcg by mouth daily before breakfast. 10/22/21   [provider]  lovastatin (MEVACOR) 40 MG tablet Take 40 mg by mouth at bedtime. 03/26/13   [provider]  metFORMIN (GLUCOPHAGE) 1000 MG tablet Take 1,000 mg by mouth 2 (two) times daily with a meal. 10/19/21   [provider]      Allergies    Lisinopril, Actos [pioglitazone], Jardiance [empagliflozin], Losartan, and Ciprofloxacin    Review of Systems   Review of Systems  Cardiovascular:  Positive for chest pain.  All other systems reviewed and are negative.   Physical Exam Updated Vital Signs BP (!) 162/101 (BP Location: Right Wrist)   Pulse 74   Temp 98.2 F (36.8 C) (Oral)   Resp 19   Ht 1.588 m (5' 2.5")   Wt  112.5 kg   SpO2 96%   BMI 44.64 kg/m  Physical Exam Vitals and nursing note reviewed.  Constitutional:      General: She is not in acute distress.    Appearance: She is well-developed.  HENT:     Head: Normocephalic and atraumatic.     Mouth/Throat:     Pharynx: No oropharyngeal exudate.  Eyes:     General: No scleral icterus.       Right eye: No discharge.        Left eye: No discharge.     Conjunctiva/sclera: Conjunctivae normal.     Pupils: Pupils are equal, round, and reactive to light.  Neck:     Thyroid: No thyromegaly.     Vascular: No JVD.   Cardiovascular:     Rate and Rhythm: Normal rate and regular rhythm.     Heart sounds: Normal heart sounds. No murmur heard.    No friction rub. No gallop.  Pulmonary:     Effort: Pulmonary effort is normal. No respiratory distress.     Breath sounds: Normal breath sounds. No wheezing or rales.  Abdominal:     General: Bowel sounds are normal. There is no distension.     Palpations: Abdomen is soft. There is no mass.     Tenderness: There is no abdominal tenderness.  Musculoskeletal:        General: No tenderness. Normal range of motion.     Cervical back: Normal range of motion and neck supple.  Lymphadenopathy:     Cervical: No cervical adenopathy.  Skin:    General: Skin is warm and dry.     Findings: No erythema or rash.  Neurological:     Mental Status: She is alert.     Coordination: Coordination normal.  Psychiatric:        Behavior: Behavior normal.     ED Results / Procedures / Treatments   Labs (all labs ordered are listed, but only abnormal results are displayed) Labs Reviewed  BASIC METABOLIC PANEL  CBC  TROPONIN I (HIGH SENSITIVITY)    EKG EKG Interpretation Date/Time:  Saturday December 07 2022 41:32:44 EDT Ventricular Rate:  74 PR Interval:  180 QRS Duration:  118 QT Interval:  420 QTC Calculation: 466 R Axis:   -45  Text Interpretation: Normal sinus rhythm Pulmonary disease pattern Left anterior fascicular block Minimal voltage criteria for LVH, may be normal variant ( Cornell product ) Abnormal QRS-T angle, consider primary T wave abnormality Abnormal ECG When compared with ECG of 03-May-2022 13:18, PREVIOUS ECG IS PRESENT Confirmed by Eber Hong ([id number]) on 12/07/2022 8:11:05 PM  Radiology No results found.  Procedures Procedures    Medications Ordered in ED Medications - No data to display  ED Course/ Medical Decision Making/ A&P                                 Medical Decision Making Amount and/or Complexity of Data Reviewed Labs:  ordered. Radiology: ordered.  Risk Prescription drug management.   The patient is EKG is unchanged, she has evidence of left middle branch block, no ST elevation or depression, this is unchanged compared to prior  Labs: Metabolic panel with stable creatinine of 1.4, electrolytes okay, CBC with stable hemoglobin and white blood cell count, troponin 10,  Imaging: CT scan is pending at this time, chest x-ray without any acute findings.  Consultations: At the time of change of  shift care signed out to Dr. Judd Lien to follow-up results and disposition accordingly.  This patient is very low risk for coronary disease given the negative heart catheterization from 2022.  I do not think a second troponin is necessary since this has been going on all day.  The patient was given fentanyl with relief, mild hypertension, stable for discharge if PE negative        Final Clinical Impression(s) / ED Diagnoses Final diagnoses:  None    Rx / DC Orders ED Discharge Orders     None         Eber Hong, MD 12/07/22 [id number]

## 2022-12-07 NOTE — ED Triage Notes (Signed)
Pt reports substernal sharp and shooting chest pain that "sometimes goes all the way to my back."  Pt states it started this morning and initially she thought it was gas however "it never when anywhere."  "Sometimes it hurts when I move" and being still makes it better.  While this has happened before it has "never been this bad."

## 2022-12-08 DIAGNOSIS — R079 Chest pain, unspecified: Secondary | ICD-10-CM | POA: Diagnosis not present

## 2022-12-08 NOTE — ED Provider Notes (Signed)
  Physical Exam  BP (!) 178/82   Pulse 71   Temp 98.2 F (36.8 C) (Oral)   Resp (!) 21   Ht 5' 2.5" (1.588 m)   Wt 112.5 kg   SpO2 95%   BMI 44.64 kg/m   Physical Exam Vitals and nursing note reviewed.  Constitutional:      Appearance: She is well-developed.  Pulmonary:     Effort: Pulmonary effort is normal.  Neurological:     Mental Status: She is alert.     Procedures  Procedures  ED Course / MDM    Medical Decision Making Amount and/or Complexity of Data Reviewed Labs: ordered. Radiology: ordered.  Risk Prescription drug management.   Care assumed from Dr. Hyacinth Meeker.  Patient presenting here with chest discomfort and is awaiting results of a CTA of the chest.  This study has resulted and is negative for pulmonary embolism.  Patient's workup thus far unremarkable including cardiac enzymes.  She had a heart cath in 2022 showing no occlusive disease, so highly doubt a cardiac etiology as well.  Nothing appears emergent and I feel the patient can safely be discharged.       Geoffery Lyons, MD 12/08/22 [phone]

## 2022-12-09 DIAGNOSIS — I890 Lymphedema, not elsewhere classified: Secondary | ICD-10-CM | POA: Diagnosis not present

## 2022-12-10 ENCOUNTER — Ambulatory Visit: Payer: Medicare PPO | Attending: Cardiology

## 2022-12-10 DIAGNOSIS — I340 Nonrheumatic mitral (valve) insufficiency: Secondary | ICD-10-CM

## 2022-12-11 DIAGNOSIS — L03115 Cellulitis of right lower limb: Secondary | ICD-10-CM | POA: Diagnosis not present

## 2022-12-11 DIAGNOSIS — R600 Localized edema: Secondary | ICD-10-CM | POA: Diagnosis not present

## 2022-12-11 DIAGNOSIS — I890 Lymphedema, not elsewhere classified: Secondary | ICD-10-CM | POA: Diagnosis not present

## 2022-12-11 DIAGNOSIS — L03116 Cellulitis of left lower limb: Secondary | ICD-10-CM | POA: Diagnosis not present

## 2022-12-11 LAB — ECHOCARDIOGRAM COMPLETE
AR max vel: 2.21 cm2
AV Area VTI: 2.37 cm2
AV Area mean vel: 2.24 cm2
AV Mean grad: 6 mm[Hg]
AV Peak grad: 11 mm[Hg]
Ao pk vel: 1.66 m/s
Area-P 1/2: 3.02 cm2
Calc EF: 51.4 %
MV M vel: 4.55 m/s
MV Peak grad: 82.8 mm[Hg]
MV VTI: 1.42 cm2
S' Lateral: 3.6 cm
Single Plane A2C EF: 51 %
Single Plane A4C EF: 51.6 %

## 2022-12-17 DIAGNOSIS — I890 Lymphedema, not elsewhere classified: Secondary | ICD-10-CM | POA: Diagnosis not present

## 2022-12-17 DIAGNOSIS — L03115 Cellulitis of right lower limb: Secondary | ICD-10-CM | POA: Diagnosis not present

## 2022-12-17 DIAGNOSIS — L03116 Cellulitis of left lower limb: Secondary | ICD-10-CM | POA: Diagnosis not present

## 2022-12-17 DIAGNOSIS — R600 Localized edema: Secondary | ICD-10-CM | POA: Diagnosis not present

## 2022-12-18 DIAGNOSIS — L03116 Cellulitis of left lower limb: Secondary | ICD-10-CM | POA: Diagnosis not present

## 2022-12-18 DIAGNOSIS — L03115 Cellulitis of right lower limb: Secondary | ICD-10-CM | POA: Diagnosis not present

## 2022-12-18 DIAGNOSIS — I890 Lymphedema, not elsewhere classified: Secondary | ICD-10-CM | POA: Diagnosis not present

## 2022-12-18 DIAGNOSIS — R600 Localized edema: Secondary | ICD-10-CM | POA: Diagnosis not present

## 2022-12-25 DIAGNOSIS — R600 Localized edema: Secondary | ICD-10-CM | POA: Diagnosis not present

## 2022-12-25 DIAGNOSIS — L03116 Cellulitis of left lower limb: Secondary | ICD-10-CM | POA: Diagnosis not present

## 2022-12-25 DIAGNOSIS — L03115 Cellulitis of right lower limb: Secondary | ICD-10-CM | POA: Diagnosis not present

## 2022-12-25 DIAGNOSIS — I890 Lymphedema, not elsewhere classified: Secondary | ICD-10-CM | POA: Diagnosis not present

## 2022-12-31 DIAGNOSIS — I130 Hypertensive heart and chronic kidney disease with heart failure and stage 1 through stage 4 chronic kidney disease, or unspecified chronic kidney disease: Secondary | ICD-10-CM | POA: Diagnosis not present

## 2022-12-31 DIAGNOSIS — I5032 Chronic diastolic (congestive) heart failure: Secondary | ICD-10-CM | POA: Diagnosis not present

## 2022-12-31 DIAGNOSIS — Z23 Encounter for immunization: Secondary | ICD-10-CM | POA: Diagnosis not present

## 2022-12-31 DIAGNOSIS — I10 Essential (primary) hypertension: Secondary | ICD-10-CM | POA: Diagnosis not present

## 2022-12-31 DIAGNOSIS — I272 Pulmonary hypertension, unspecified: Secondary | ICD-10-CM | POA: Diagnosis not present

## 2022-12-31 DIAGNOSIS — E1165 Type 2 diabetes mellitus with hyperglycemia: Secondary | ICD-10-CM | POA: Diagnosis not present

## 2022-12-31 DIAGNOSIS — N183 Chronic kidney disease, stage 3 unspecified: Secondary | ICD-10-CM | POA: Diagnosis not present

## 2022-12-31 DIAGNOSIS — I340 Nonrheumatic mitral (valve) insufficiency: Secondary | ICD-10-CM | POA: Diagnosis not present

## 2022-12-31 DIAGNOSIS — I25118 Atherosclerotic heart disease of native coronary artery with other forms of angina pectoris: Secondary | ICD-10-CM | POA: Diagnosis not present

## 2023-01-03 ENCOUNTER — Other Ambulatory Visit: Payer: Self-pay | Admitting: Cardiology

## 2023-02-03 ENCOUNTER — Other Ambulatory Visit: Payer: Self-pay | Admitting: Cardiology

## 2023-02-05 DIAGNOSIS — R921 Mammographic calcification found on diagnostic imaging of breast: Secondary | ICD-10-CM | POA: Diagnosis not present

## 2023-02-05 DIAGNOSIS — R92322 Mammographic fibroglandular density, left breast: Secondary | ICD-10-CM | POA: Diagnosis not present

## 2023-02-05 DIAGNOSIS — R928 Other abnormal and inconclusive findings on diagnostic imaging of breast: Secondary | ICD-10-CM | POA: Diagnosis not present

## 2023-02-14 ENCOUNTER — Other Ambulatory Visit: Payer: Self-pay | Admitting: Cardiology

## 2023-05-23 ENCOUNTER — Telehealth: Payer: Self-pay | Admitting: Cardiology

## 2023-05-23 MED ORDER — FUROSEMIDE 20 MG PO TABS: 40.0000 mg | ORAL_TABLET | Freq: Every day | ORAL | 1 refills | Status: DC

## 2023-05-23 NOTE — Telephone Encounter (Signed)
 *  STAT* If patient is at the pharmacy, call can be transferred to refill team.   1. Which medications need to be refilled? (please list name of each medication and dose if known)   furosemide (LASIX) 20 MG tablet     2. Would you like to learn more about the convenience, safety, & potential cost savings by using the [facility]? No      3. Are you open to using the [facility] (Type [facility]. ). No    4. Which pharmacy/location (including street and city if local pharmacy) is medication to be sent to?  [id number] - [place], [place] - [address]     5. Do they need a 30 day or 90 day supply? 90 days

## 2023-06-10 DIAGNOSIS — H26493 Other secondary cataract, bilateral: Secondary | ICD-10-CM | POA: Diagnosis not present

## 2023-06-10 DIAGNOSIS — H353132 Nonexudative age-related macular degeneration, bilateral, intermediate dry stage: Secondary | ICD-10-CM | POA: Diagnosis not present

## 2023-06-10 DIAGNOSIS — H18413 Arcus senilis, bilateral: Secondary | ICD-10-CM | POA: Diagnosis not present

## 2023-06-10 DIAGNOSIS — H26492 Other secondary cataract, left eye: Secondary | ICD-10-CM | POA: Diagnosis not present

## 2023-06-10 DIAGNOSIS — Z961 Presence of intraocular lens: Secondary | ICD-10-CM | POA: Diagnosis not present

## 2023-06-17 DIAGNOSIS — H43813 Vitreous degeneration, bilateral: Secondary | ICD-10-CM | POA: Diagnosis not present

## 2023-06-17 DIAGNOSIS — E113593 Type 2 diabetes mellitus with proliferative diabetic retinopathy without macular edema, bilateral: Secondary | ICD-10-CM | POA: Diagnosis not present

## 2023-06-24 DIAGNOSIS — H26491 Other secondary cataract, right eye: Secondary | ICD-10-CM | POA: Diagnosis not present

## 2023-07-01 DIAGNOSIS — E113592 Type 2 diabetes mellitus with proliferative diabetic retinopathy without macular edema, left eye: Secondary | ICD-10-CM | POA: Diagnosis not present

## 2023-07-09 ENCOUNTER — Telehealth: Payer: Self-pay | Admitting: Cardiology

## 2023-07-09 MED ORDER — FUROSEMIDE 20 MG PO TABS: 40.0000 mg | ORAL_TABLET | Freq: Every day | ORAL | 1 refills | Status: DC

## 2023-07-09 MED ORDER — BISOPROLOL FUMARATE 5 MG PO TABS: 5.0000 mg | ORAL_TABLET | Freq: Every day | ORAL | 1 refills | Status: DC

## 2023-07-09 MED ORDER — ISOSORBIDE DINITRATE 20 MG PO TABS: 20.0000 mg | ORAL_TABLET | Freq: Three times a day (TID) | ORAL | 1 refills | Status: DC

## 2023-07-09 NOTE — Telephone Encounter (Signed)
 Pt's medications were sent to pt's pharmacy as requested. Confirmation received.

## 2023-07-09 NOTE — Telephone Encounter (Signed)
 Pt c/o medication issue:  1. Name of Medication:   hydrALAZINE  (APRESOLINE ) 25 MG tablet    2. How are you currently taking this medication (dosage and times per day)?  TAKE 1 TABLET THREE TIMES DAILY   3. Are you having a reaction (difficulty breathing--STAT)? No  4. What is your medication issue? Patient is requesting a refill, but stated this medication dosage was changed to 2MG  instead of 25MG . Please advise.

## 2023-07-09 NOTE — Telephone Encounter (Signed)
 Spoke with patient and she is aware her hydralazine  is 25 mg TID. She checked her bottle. She also is aware her she has 3 refills and should contact her pharmacy

## 2023-07-09 NOTE — Telephone Encounter (Signed)
*  STAT* If patient is at the pharmacy, call can be transferred to refill team.   1. Which medications need to be refilled? (please list name of each medication and dose if known) bisoprolol  (ZEBETA ) 5 MG tablet  TAKE 1 TABLET EVERY DAY  furosemide  (LASIX ) 20 MG tablet  Take 2 tablets (40 mg total) by mouth daily.   isosorbide  dinitrate (ISORDIL ) 20 MG tablet  Take 1 tablet (20 mg total) by mouth 3 (three) times daily.  2. Would you like to learn more about the convenience, safety, & potential cost savings by using the [facility]? No   3. Are you open to using the [facility] No   4. Which pharmacy/location (including street and city if local pharmacy) is medication to be sent to?[id number] - [place], [place] - [address]    5. Do they need a 30 day or 90 day supply? 90 Day Supply

## 2023-07-15 DIAGNOSIS — E1140 Type 2 diabetes mellitus with diabetic neuropathy, unspecified: Secondary | ICD-10-CM | POA: Diagnosis not present

## 2023-07-15 DIAGNOSIS — I10 Essential (primary) hypertension: Secondary | ICD-10-CM | POA: Diagnosis not present

## 2023-07-15 DIAGNOSIS — I2510 Atherosclerotic heart disease of native coronary artery without angina pectoris: Secondary | ICD-10-CM | POA: Diagnosis not present

## 2023-07-15 DIAGNOSIS — Z860100 Personal history of colon polyps, unspecified: Secondary | ICD-10-CM | POA: Diagnosis not present

## 2023-07-15 DIAGNOSIS — D649 Anemia, unspecified: Secondary | ICD-10-CM | POA: Diagnosis not present

## 2023-07-15 DIAGNOSIS — I5032 Chronic diastolic (congestive) heart failure: Secondary | ICD-10-CM | POA: Diagnosis not present

## 2023-07-15 DIAGNOSIS — E039 Hypothyroidism, unspecified: Secondary | ICD-10-CM | POA: Diagnosis not present

## 2023-07-15 DIAGNOSIS — E785 Hyperlipidemia, unspecified: Secondary | ICD-10-CM | POA: Diagnosis not present

## 2023-07-15 DIAGNOSIS — E113392 Type 2 diabetes mellitus with moderate nonproliferative diabetic retinopathy without macular edema, left eye: Secondary | ICD-10-CM | POA: Diagnosis not present

## 2023-07-15 DIAGNOSIS — I272 Pulmonary hypertension, unspecified: Secondary | ICD-10-CM | POA: Diagnosis not present

## 2023-07-29 DIAGNOSIS — E113592 Type 2 diabetes mellitus with proliferative diabetic retinopathy without macular edema, left eye: Secondary | ICD-10-CM | POA: Diagnosis not present

## 2023-08-11 DIAGNOSIS — R928 Other abnormal and inconclusive findings on diagnostic imaging of breast: Secondary | ICD-10-CM | POA: Diagnosis not present

## 2023-08-11 DIAGNOSIS — R921 Mammographic calcification found on diagnostic imaging of breast: Secondary | ICD-10-CM | POA: Diagnosis not present

## 2023-10-25 ENCOUNTER — Other Ambulatory Visit: Payer: Self-pay | Admitting: Cardiology

## 2023-10-29 ENCOUNTER — Telehealth: Payer: Self-pay | Admitting: Cardiology

## 2023-10-29 MED ORDER — FUROSEMIDE 20 MG PO TABS: 40.0000 mg | ORAL_TABLET | Freq: Every day | ORAL | 0 refills | Status: DC

## 2023-10-29 MED ORDER — HYDRALAZINE HCL 25 MG PO TABS: 25.0000 mg | ORAL_TABLET | Freq: Three times a day (TID) | ORAL | 0 refills | Status: DC

## 2023-10-29 NOTE — Telephone Encounter (Signed)
 RX sent in

## 2023-10-29 NOTE — Telephone Encounter (Signed)
*  STAT* If patient is at the pharmacy, call can be transferred to refill team.   1. Which medications need to be refilled? (please list name of each medication and dose if known)   hydrALAZINE  (APRESOLINE ) 25 MG tablet  furosemide  (LASIX ) 20 MG tablet  2. Which pharmacy/location (including street and city if local pharmacy) is medication to be sent to? [id number] - [place], [place] - [address] Phone: [phone]  Fax: [phone]     3. Do they need a 30 day or 90 day supply? 90

## 2023-11-19 ENCOUNTER — Other Ambulatory Visit: Payer: Self-pay | Admitting: Cardiology

## 2023-11-23 NOTE — Progress Notes (Unsigned)
 Cardiology Office Note:   Date:  11/26/2023  ID:  Melissa, Zhang January 19, 1941, MRN [id number] PCP: Frederik Charleston, MD  [facility] Providers Cardiologist:  Lynwood Schilling, MD {  History of Present Illness:   Melissa Zhang is a 83 y.o. female who presents for follow up of valvular heart disease.  Echo in 2018 demonstrated an EF of 50 - 55% with moderate MR and a PFO vs. ASD.     I saw her for the first time in December 2021.  Follow up echo demonstrated that the that her EF was down to 45%.  Ejection fraction is slightly reduced compared to previous.   MR was moderate on TEE.   I sent her for a TEE and MR was moderately severe.    I sent her for a cardiac cath with non obstructive CAD and severe pulmonary HTN.     I sent her for another echo in October 2023.  Her pulmonary pressures was  increased compared to previous.  This appeared to be  multifactorial with her sleep apnea and morbid obesity.      Since I last saw her she was in the emergency room from some chest discomfort in late October.  I reviewed these records and it was not thought to be anginal.  CT was negative for any evidence of a PE.  There were no acute findings.  She has not had the symptoms since then.  She gets around slowly.  She has some chronic dyspnea.  She tries to paddle and exercise pedal for an hour and a half a day.  She has no new shortness of breath with this.  She has no new palpitations, presyncope or syncope.  She is not having any PND or orthopnea.  She does have chronic lower extremity swelling.  She is unstable on her feet and cannot do too much so she does not do a lot of walking.  ROS: As stated in the HPI and negative for all other systems.  Studies Reviewed:    EKG:   EKG Interpretation Date/Time:  Wednesday November 26 2023 13:22:25 EDT Ventricular Rate:  78 PR Interval:  178 QRS Duration:  130 QT Interval:  410 QTC Calculation: 467 R Axis:   -61  Text Interpretation: Normal sinus  rhythm Left axis deviation Non-specific intra-ventricular conduction block Minimal voltage criteria for LVH, may be normal variant ( Cornell product ) Abnormal QRS-T angle, consider primary T wave abnormality When compared with ECG of 07-Dec-2022 20:17, No significant change since last tracing Confirmed by Schilling Lynwood ([id number]) on 11/26/2023 1:27:35 PM    Risk Assessment/Calculations:              Physical Exam:   VS:  BP 124/68   Pulse 78   Ht 5' 2.5 (1.588 m)   Wt 249 lb (112.9 kg)   BMI 44.82 kg/m    Wt Readings from Last 3 Encounters:  11/26/23 249 lb (112.9 kg)  12/07/22 248 lb (112.5 kg)  11/27/22 248 lb (112.5 kg)     GEN: Well nourished, well developed in no acute distress NECK: No JVD; No carotid bruits CARDIAC: RRR, no murmurs, rubs, gallops RESPIRATORY:  Clear to auscultation without rales, wheezing or rhonchi  ABDOMEN: Soft, non-tender, non-distended EXTREMITIES:  No edema; No deformity   ASSESSMENT AND PLAN:   MR:  This was moderate on echo in October 2024.  This was unchanged from previous.   We have talked about this in the  past and she would want conservative therapy and she reiterated this again today.  No further imaging.   Pulmonary HTN:   This was moderately elevated on echo in October 2024.  I think this is probably a combination of some diastolic dysfunction, MR and may be hypoventilation obesity.   Again she wants conservative therapy and I will be addressing this as below.  She had evaluation and it was not thought that she would have pulmonary hypertension amenable to vasoactive drugs.  Her pulmonary pressure was only 3.24 [place] units.   Cardiomyopathy:  Her EF was 50 to 55% in the past.   No change in therapy as above other than diuretics.  She has not tolerated SGLT2 inhibitors or GLP-1 receptor agonist.     Morbid obesity: I think this is a significant problem.  We have previously talked about enrollment in weight loss clinic but she did not follow  through with this.   Chronic diastolic dysfunction: She does have some increased lower extremity swelling.  I am going to switch her from Lasix  40 mg daily to torsemide 20.  I will check a basic metabolic profile in 1 week and then again in 1 month.  If this does not work I will probably double this.  Small secundum ASD:   This was found incidentally.  No change in therapy.    Sleep apnea:    She had an abnormal sleep study but has declined CPAP.    Essential HTN:    The blood pressure is at target.  No change in therapy.     Follow up with me in 1 year.  Signed, Lynwood Schilling, MD

## 2023-11-25 ENCOUNTER — Other Ambulatory Visit: Payer: Self-pay | Admitting: Cardiology

## 2023-11-26 ENCOUNTER — Ambulatory Visit (INDEPENDENT_AMBULATORY_CARE_PROVIDER_SITE_OTHER): Admitting: Cardiology

## 2023-11-26 ENCOUNTER — Encounter: Payer: Self-pay | Admitting: Cardiology

## 2023-11-26 VITALS — BP 124/68 | HR 78 | Ht 62.5 in | Wt 249.0 lb

## 2023-11-26 DIAGNOSIS — Q2111 Secundum atrial septal defect: Secondary | ICD-10-CM

## 2023-11-26 DIAGNOSIS — Z79899 Other long term (current) drug therapy: Secondary | ICD-10-CM | POA: Diagnosis not present

## 2023-11-26 DIAGNOSIS — I10 Essential (primary) hypertension: Secondary | ICD-10-CM | POA: Diagnosis not present

## 2023-11-26 DIAGNOSIS — I272 Pulmonary hypertension, unspecified: Secondary | ICD-10-CM

## 2023-11-26 DIAGNOSIS — I5189 Other ill-defined heart diseases: Secondary | ICD-10-CM

## 2023-11-26 DIAGNOSIS — I340 Nonrheumatic mitral (valve) insufficiency: Secondary | ICD-10-CM | POA: Diagnosis not present

## 2023-11-26 MED ORDER — TORSEMIDE 20 MG PO TABS: 20.0000 mg | ORAL_TABLET | Freq: Every day | ORAL | 2 refills | Status: AC

## 2023-11-26 NOTE — Patient Instructions (Addendum)
 Medication Instructions:  Your physician has recommended you make the following change in your medication:  Stop furosemide  Start torsemide 20 mg daily Continue all other medications as prescribed  Labwork: BMET due in one week (12/03/2023) BMET due in one month (12/26/2023) or (11/10/12025) Non-fasting [facility] ([address] [place])  Testing/Procedures: none  Follow-Up: Your physician recommends that you schedule a follow-up appointment in: 1 year. You will receive a reminder call in about 8-10 months reminding you to schedule your appointment. If you don't receive this call, please contact our office.  Any Other Special Instructions Will Be Listed Below (If Applicable).  If you need a refill on your cardiac medications before your next appointment, please call your pharmacy.

## 2023-12-03 DIAGNOSIS — I272 Pulmonary hypertension, unspecified: Secondary | ICD-10-CM | POA: Diagnosis not present

## 2023-12-03 DIAGNOSIS — I5189 Other ill-defined heart diseases: Secondary | ICD-10-CM | POA: Diagnosis not present

## 2023-12-03 DIAGNOSIS — I340 Nonrheumatic mitral (valve) insufficiency: Secondary | ICD-10-CM | POA: Diagnosis not present

## 2023-12-03 DIAGNOSIS — I10 Essential (primary) hypertension: Secondary | ICD-10-CM | POA: Diagnosis not present

## 2023-12-03 DIAGNOSIS — Z79899 Other long term (current) drug therapy: Secondary | ICD-10-CM | POA: Diagnosis not present

## 2023-12-04 LAB — BASIC METABOLIC PANEL WITH GFR
BUN/Creatinine Ratio: 23 (ref 12–28)
BUN: 38 mg/dL — ABNORMAL HIGH (ref 8–27)
CO2: 22 mmol/L (ref 20–29)
Calcium: 9.3 mg/dL (ref 8.7–10.3)
Chloride: 101 mmol/L (ref 96–106)
Creatinine, Ser: 1.63 mg/dL — ABNORMAL HIGH (ref 0.57–1.00)
Glucose: 203 mg/dL — ABNORMAL HIGH (ref 70–99)
Potassium: 5.2 mmol/L (ref 3.5–5.2)
Sodium: 139 mmol/L (ref 134–144)
eGFR: 31 mL/min/1.73 — ABNORMAL LOW (ref >59)

## 2023-12-07 ENCOUNTER — Ambulatory Visit: Payer: Self-pay | Admitting: Cardiology

## 2023-12-07 DIAGNOSIS — Z79899 Other long term (current) drug therapy: Secondary | ICD-10-CM

## 2023-12-07 DIAGNOSIS — E875 Hyperkalemia: Secondary | ICD-10-CM

## 2023-12-16 DIAGNOSIS — D509 Iron deficiency anemia, unspecified: Secondary | ICD-10-CM | POA: Diagnosis not present

## 2023-12-16 DIAGNOSIS — E113392 Type 2 diabetes mellitus with moderate nonproliferative diabetic retinopathy without macular edema, left eye: Secondary | ICD-10-CM | POA: Diagnosis not present

## 2023-12-27 LAB — BASIC METABOLIC PANEL WITH GFR
BUN/Creatinine Ratio: 18 (ref 12–28)
BUN: 31 mg/dL — ABNORMAL HIGH (ref 8–27)
CO2: 24 mmol/L (ref 20–29)
Calcium: 9.3 mg/dL (ref 8.7–10.3)
Chloride: 101 mmol/L (ref 96–106)
Creatinine, Ser: 1.68 mg/dL — ABNORMAL HIGH (ref 0.57–1.00)
Glucose: 133 mg/dL — ABNORMAL HIGH (ref 70–99)
Potassium: 5.6 mmol/L — ABNORMAL HIGH (ref 3.5–5.2)
Sodium: 139 mmol/L (ref 134–144)
eGFR: 30 mL/min/1.73 — ABNORMAL LOW (ref >59)

## 2023-12-29 NOTE — Telephone Encounter (Signed)
 Spoke with pt regarding her results. Pt agreeable to plan. Lab ordered and released. Pt verbalized understanding. All questions if any were answered. Results sent to PCP

## 2023-12-29 NOTE — Telephone Encounter (Signed)
-----   Message from Lynwood Schilling sent at 12/28/2023  1:14 PM EST ----- Her creat is about the same.   Her potassium is elevated.  She is not on potassium and I wonder if this is secondary to the way the lab was drawn.  Please repeat a BMET this week.  Call Ms. Lang  with the results and send results to Frederik Charleston, MD ----- Message ----- From: Interface, [facility] Lab Results In Sent: 12/04/2023   5:36 AM EST To: Lynwood Schilling, MD

## 2023-12-30 ENCOUNTER — Other Ambulatory Visit: Payer: Self-pay | Admitting: *Deleted

## 2023-12-30 ENCOUNTER — Telehealth: Payer: Self-pay | Admitting: *Deleted

## 2023-12-30 ENCOUNTER — Other Ambulatory Visit (HOSPITAL_COMMUNITY)
Admission: RE | Admit: 2023-12-30 | Discharge: 2023-12-30 | Disposition: A | Discharge status: Home / Self Care | Source: Ambulatory Visit | Attending: Cardiology | Admitting: Cardiology

## 2023-12-30 DIAGNOSIS — E875 Hyperkalemia: Secondary | ICD-10-CM

## 2023-12-30 LAB — BASIC METABOLIC PANEL WITH GFR
Anion gap: 11 (ref 5–15)
BUN/Creatinine Ratio: 22 (ref 12–28)
BUN: 33 mg/dL — ABNORMAL HIGH (ref 8–27)
BUN: 31 mg/dL — ABNORMAL HIGH (ref 8–23)
CO2: 21 mmol/L (ref 20–29)
CO2: 28 mmol/L (ref 22–32)
Calcium: 9.5 mg/dL (ref 8.7–10.3)
Calcium: 9.5 mg/dL (ref 8.9–10.3)
Chloride: 101 mmol/L (ref 96–106)
Chloride: 101 mmol/L (ref 98–111)
Creatinine, Ser: 1.47 mg/dL — ABNORMAL HIGH (ref 0.57–1.00)
Creatinine, Ser: 1.46 mg/dL — ABNORMAL HIGH (ref 0.44–1.00)
GFR, Estimated: 36 mL/min — ABNORMAL LOW (ref >60)
Glucose, Bld: 146 mg/dL — ABNORMAL HIGH (ref 70–99)
Glucose: 230 mg/dL — ABNORMAL HIGH (ref 70–99)
Potassium: 5.8 mmol/L (ref 3.5–5.2)
Potassium: 5.1 mmol/L (ref 3.5–5.1)
Sodium: 140 mmol/L (ref 134–144)
Sodium: 139 mmol/L (ref 135–145)
eGFR: 36 mL/min/1.73 — ABNORMAL LOW (ref >59)

## 2023-12-30 NOTE — Telephone Encounter (Signed)
 Melissa Agent, MD to Me    12/30/23  4:18 PM Looks like the lab is back and normal potassium.     Call placed to patient.  Left message to call office

## 2023-12-30 NOTE — Telephone Encounter (Signed)
 11/10 lab results reviewed with Dr Kate (office DOD).  He would like patient to have repeat labs done in hospital lab--[facility] or [facility]. I spoke with patient and and reviewed lab results and recommendations with her.  She will go to [facility] to have lab work done.  She will try to go today. Patient is feeling fine.  Patient advised to stay hydrated, avoid salt substitutes and avoid foods high in potassium

## 2023-12-30 NOTE — Telephone Encounter (Signed)
Patient notified.  Results routed to patient's PCP

## 2024-01-01 NOTE — Telephone Encounter (Signed)
See telephone call from 11/11

## 2024-01-21 ENCOUNTER — Ambulatory Visit: Admitting: Cardiology

## 2024-02-14 ENCOUNTER — Other Ambulatory Visit: Payer: Self-pay | Admitting: Cardiology
